# Patient Record
Sex: Male | Born: 1965
Health system: Southern US, Community
[De-identification: ages and names within clinical notes are randomized; demographics above are authoritative.]

## PROBLEM LIST (undated history)

## (undated) DIAGNOSIS — M2550 Pain in unspecified joint: Secondary | ICD-10-CM

## (undated) DIAGNOSIS — K219 Gastro-esophageal reflux disease without esophagitis: Secondary | ICD-10-CM

## (undated) DIAGNOSIS — Z8619 Personal history of other infectious and parasitic diseases: Secondary | ICD-10-CM

## (undated) DIAGNOSIS — G4700 Insomnia, unspecified: Secondary | ICD-10-CM

## (undated) DIAGNOSIS — F329 Major depressive disorder, single episode, unspecified: Secondary | ICD-10-CM

## (undated) DIAGNOSIS — E785 Hyperlipidemia, unspecified: Secondary | ICD-10-CM

## (undated) DIAGNOSIS — F32A Depression, unspecified: Secondary | ICD-10-CM

## (undated) DIAGNOSIS — I10 Essential (primary) hypertension: Secondary | ICD-10-CM

## (undated) HISTORY — DX: Insomnia, unspecified: G47.00

## (undated) HISTORY — DX: Major depressive disorder, single episode, unspecified: F32.9

## (undated) HISTORY — DX: Personal history of other infectious and parasitic diseases: Z86.19

## (undated) HISTORY — DX: Depression, unspecified: F32.A

## (undated) HISTORY — DX: Hyperlipidemia, unspecified: E78.5

## (undated) HISTORY — DX: Pain in unspecified joint: M25.50

## (undated) HISTORY — PX: HEMORRHOID SURGERY: SHX153

## (undated) HISTORY — PX: COLONOSCOPY: SHX174

## (undated) HISTORY — DX: Gastro-esophageal reflux disease without esophagitis: K21.9

## (undated) HISTORY — DX: Essential (primary) hypertension: I10

---

## 2003-10-04 ENCOUNTER — Encounter: Payer: Self-pay | Admitting: Family Medicine

## 2003-10-04 ENCOUNTER — Encounter: Admission: RE | Admit: 2003-10-04 | Discharge: 2003-10-04 | Payer: Self-pay | Admitting: Family Medicine

## 2003-12-23 ENCOUNTER — Encounter (INDEPENDENT_AMBULATORY_CARE_PROVIDER_SITE_OTHER): Payer: Self-pay | Admitting: Specialist

## 2003-12-23 ENCOUNTER — Ambulatory Visit (HOSPITAL_COMMUNITY): Admission: RE | Admit: 2003-12-23 | Discharge: 2003-12-23 | Payer: Self-pay | Admitting: General Surgery

## 2003-12-23 ENCOUNTER — Ambulatory Visit (HOSPITAL_BASED_OUTPATIENT_CLINIC_OR_DEPARTMENT_OTHER): Admission: RE | Admit: 2003-12-23 | Discharge: 2003-12-23 | Payer: Self-pay | Admitting: General Surgery

## 2004-12-17 HISTORY — PX: NASAL FRACTURE SURGERY: SHX718

## 2004-12-17 HISTORY — PX: HAND SURGERY: SHX662

## 2005-01-03 ENCOUNTER — Ambulatory Visit (HOSPITAL_COMMUNITY): Admission: RE | Admit: 2005-01-03 | Discharge: 2005-01-03 | Payer: Self-pay | Admitting: Orthopedic Surgery

## 2005-01-06 ENCOUNTER — Encounter (INDEPENDENT_AMBULATORY_CARE_PROVIDER_SITE_OTHER): Payer: Self-pay | Admitting: Specialist

## 2005-01-06 ENCOUNTER — Ambulatory Visit (HOSPITAL_COMMUNITY): Admission: RE | Admit: 2005-01-06 | Discharge: 2005-01-07 | Payer: Self-pay | Admitting: Orthopedic Surgery

## 2005-01-24 ENCOUNTER — Ambulatory Visit (HOSPITAL_COMMUNITY): Admission: RE | Admit: 2005-01-24 | Discharge: 2005-01-24 | Payer: Self-pay | Admitting: *Deleted

## 2005-07-26 ENCOUNTER — Ambulatory Visit (HOSPITAL_BASED_OUTPATIENT_CLINIC_OR_DEPARTMENT_OTHER): Admission: RE | Admit: 2005-07-26 | Discharge: 2005-07-26 | Payer: Self-pay | Admitting: Orthopedic Surgery

## 2005-07-26 ENCOUNTER — Ambulatory Visit (HOSPITAL_COMMUNITY): Admission: RE | Admit: 2005-07-26 | Discharge: 2005-07-26 | Payer: Self-pay | Admitting: Orthopedic Surgery

## 2006-01-09 ENCOUNTER — Encounter: Admission: RE | Admit: 2006-01-09 | Discharge: 2006-01-09 | Payer: Self-pay | Admitting: Family Medicine

## 2006-06-18 ENCOUNTER — Ambulatory Visit: Payer: Self-pay | Admitting: Family Medicine

## 2006-09-18 ENCOUNTER — Ambulatory Visit: Payer: Self-pay | Admitting: Family Medicine

## 2006-12-19 ENCOUNTER — Ambulatory Visit: Payer: Self-pay | Admitting: Internal Medicine

## 2007-01-14 ENCOUNTER — Ambulatory Visit: Payer: Self-pay | Admitting: Internal Medicine

## 2007-02-09 DIAGNOSIS — G4700 Insomnia, unspecified: Secondary | ICD-10-CM | POA: Insufficient documentation

## 2007-02-09 DIAGNOSIS — I10 Essential (primary) hypertension: Secondary | ICD-10-CM | POA: Insufficient documentation

## 2007-03-19 ENCOUNTER — Ambulatory Visit: Payer: Self-pay | Admitting: Family Medicine

## 2007-03-19 LAB — CONVERTED CEMR LAB
Chloride: 107 meq/L (ref 96–112)
Cholesterol: 212 mg/dL (ref 0–200)
Direct LDL: 119.8 mg/dL
GFR calc Af Amer: 105 mL/min
GFR calc non Af Amer: 87 mL/min
Potassium: 3.8 meq/L (ref 3.5–5.1)
Sodium: 141 meq/L (ref 135–145)
Total CHOL/HDL Ratio: 5.3
Triglycerides: 386 mg/dL (ref 0–149)
VLDL: 77 mg/dL — ABNORMAL HIGH (ref 0–40)

## 2007-08-26 ENCOUNTER — Ambulatory Visit: Payer: Self-pay | Admitting: Family Medicine

## 2007-08-26 DIAGNOSIS — J45909 Unspecified asthma, uncomplicated: Secondary | ICD-10-CM | POA: Insufficient documentation

## 2007-08-28 ENCOUNTER — Telehealth (INDEPENDENT_AMBULATORY_CARE_PROVIDER_SITE_OTHER): Payer: Self-pay | Admitting: *Deleted

## 2007-08-28 LAB — CONVERTED CEMR LAB
CO2: 27 meq/L (ref 19–32)
Calcium: 9.6 mg/dL (ref 8.4–10.5)
Chloride: 106 meq/L (ref 96–112)
Creatinine, Ser: 1.1 mg/dL (ref 0.4–1.5)
Direct LDL: 141 mg/dL
Glucose, Bld: 103 mg/dL — ABNORMAL HIGH (ref 70–99)
Triglycerides: 295 mg/dL (ref 0–149)

## 2007-10-28 ENCOUNTER — Ambulatory Visit: Payer: Self-pay | Admitting: Family Medicine

## 2007-11-05 ENCOUNTER — Telehealth (INDEPENDENT_AMBULATORY_CARE_PROVIDER_SITE_OTHER): Payer: Self-pay | Admitting: *Deleted

## 2007-11-05 ENCOUNTER — Encounter (INDEPENDENT_AMBULATORY_CARE_PROVIDER_SITE_OTHER): Payer: Self-pay | Admitting: *Deleted

## 2007-11-05 LAB — CONVERTED CEMR LAB
Cholesterol: 160 mg/dL (ref 0–200)
Total CHOL/HDL Ratio: 4.3
Triglycerides: 223 mg/dL (ref 0–149)

## 2007-12-18 DIAGNOSIS — M2550 Pain in unspecified joint: Secondary | ICD-10-CM

## 2007-12-18 HISTORY — DX: Pain in unspecified joint: M25.50

## 2007-12-30 ENCOUNTER — Ambulatory Visit: Payer: Self-pay | Admitting: Family Medicine

## 2007-12-30 DIAGNOSIS — R1013 Epigastric pain: Secondary | ICD-10-CM

## 2007-12-30 DIAGNOSIS — F3289 Other specified depressive episodes: Secondary | ICD-10-CM | POA: Insufficient documentation

## 2007-12-30 DIAGNOSIS — K3189 Other diseases of stomach and duodenum: Secondary | ICD-10-CM | POA: Insufficient documentation

## 2007-12-30 DIAGNOSIS — F329 Major depressive disorder, single episode, unspecified: Secondary | ICD-10-CM | POA: Insufficient documentation

## 2008-01-01 ENCOUNTER — Encounter (INDEPENDENT_AMBULATORY_CARE_PROVIDER_SITE_OTHER): Payer: Self-pay | Admitting: *Deleted

## 2008-01-01 LAB — CONVERTED CEMR LAB
BUN: 14 mg/dL (ref 6–23)
Calcium: 9.3 mg/dL (ref 8.4–10.5)
Direct LDL: 88.8 mg/dL
GFR calc Af Amer: 85 mL/min
GFR calc non Af Amer: 70 mL/min
Glucose, Bld: 94 mg/dL (ref 70–99)
HDL: 40.1 mg/dL (ref >39.0)
Potassium: 3.6 meq/L (ref 3.5–5.1)
Total CHOL/HDL Ratio: 4.3
Triglycerides: 288 mg/dL (ref 0–149)

## 2008-04-28 ENCOUNTER — Ambulatory Visit: Payer: Self-pay | Admitting: Internal Medicine

## 2008-04-28 DIAGNOSIS — E785 Hyperlipidemia, unspecified: Secondary | ICD-10-CM | POA: Insufficient documentation

## 2008-05-04 LAB — CONVERTED CEMR LAB
ALT: 40 units/L (ref 0–53)
Cholesterol: 161 mg/dL (ref 0–200)
Direct LDL: 69.2 mg/dL
Total CHOL/HDL Ratio: 5.5

## 2008-06-01 ENCOUNTER — Telehealth: Payer: Self-pay | Admitting: Internal Medicine

## 2008-07-27 ENCOUNTER — Encounter: Admission: RE | Admit: 2008-07-27 | Discharge: 2008-07-27 | Payer: Self-pay | Admitting: Internal Medicine

## 2008-07-27 ENCOUNTER — Ambulatory Visit: Payer: Self-pay | Admitting: Internal Medicine

## 2008-07-27 DIAGNOSIS — M2550 Pain in unspecified joint: Secondary | ICD-10-CM | POA: Insufficient documentation

## 2008-07-27 DIAGNOSIS — R51 Headache: Secondary | ICD-10-CM | POA: Insufficient documentation

## 2008-07-27 DIAGNOSIS — R519 Headache, unspecified: Secondary | ICD-10-CM | POA: Insufficient documentation

## 2008-07-27 LAB — CONVERTED CEMR LAB: Rapid Strep: NEGATIVE

## 2008-07-28 ENCOUNTER — Ambulatory Visit: Payer: Self-pay | Admitting: Internal Medicine

## 2008-07-30 ENCOUNTER — Encounter (INDEPENDENT_AMBULATORY_CARE_PROVIDER_SITE_OTHER): Payer: Self-pay | Admitting: *Deleted

## 2008-07-30 ENCOUNTER — Encounter: Payer: Self-pay | Admitting: Internal Medicine

## 2008-07-30 ENCOUNTER — Telehealth (INDEPENDENT_AMBULATORY_CARE_PROVIDER_SITE_OTHER): Payer: Self-pay | Admitting: *Deleted

## 2008-07-30 LAB — CONVERTED CEMR LAB
Basophils Absolute: 0.1 10*3/uL (ref 0.0–0.1)
Basophils Relative: 3.5 % — ABNORMAL HIGH (ref 0.0–3.0)
CO2: 27 meq/L (ref 19–32)
Chloride: 109 meq/L (ref 96–112)
Creatinine, Ser: 1.2 mg/dL (ref 0.4–1.5)
Eosinophils Absolute: 0.2 10*3/uL (ref 0.0–0.7)
GFR calc non Af Amer: 70 mL/min
Lymphocytes Relative: 63.9 % — ABNORMAL HIGH (ref 12.0–46.0)
MCHC: 35.4 g/dL (ref 30.0–36.0)
MCV: 87.6 fL (ref 78.0–100.0)
Mono Screen: NEGATIVE
Neutrophils Relative %: 17.1 % — ABNORMAL LOW (ref 43.0–77.0)
Platelets: 139 10*3/uL — ABNORMAL LOW (ref 150–400)
Potassium: 4.2 meq/L (ref 3.5–5.1)
RBC: 5.16 M/uL (ref 4.22–5.81)
Rhuematoid fact SerPl-aCnc: <20 intl units/mL — ABNORMAL LOW (ref 0.0–20.0)
Sodium: 143 meq/L (ref 135–145)
Total CK: 240 units/L (ref 7–195)

## 2008-08-03 ENCOUNTER — Encounter: Payer: Self-pay | Admitting: Internal Medicine

## 2008-08-17 ENCOUNTER — Encounter: Payer: Self-pay | Admitting: Internal Medicine

## 2008-09-08 ENCOUNTER — Encounter: Payer: Self-pay | Admitting: Internal Medicine

## 2008-10-20 ENCOUNTER — Telehealth (INDEPENDENT_AMBULATORY_CARE_PROVIDER_SITE_OTHER): Payer: Self-pay | Admitting: *Deleted

## 2008-10-27 ENCOUNTER — Ambulatory Visit: Payer: Self-pay | Admitting: Internal Medicine

## 2009-01-28 ENCOUNTER — Ambulatory Visit: Payer: Self-pay | Admitting: Internal Medicine

## 2009-04-04 ENCOUNTER — Ambulatory Visit: Payer: Self-pay | Admitting: Family Medicine

## 2009-04-05 ENCOUNTER — Telehealth (INDEPENDENT_AMBULATORY_CARE_PROVIDER_SITE_OTHER): Payer: Self-pay | Admitting: *Deleted

## 2009-04-06 ENCOUNTER — Ambulatory Visit: Payer: Self-pay | Admitting: Internal Medicine

## 2009-04-07 ENCOUNTER — Telehealth (INDEPENDENT_AMBULATORY_CARE_PROVIDER_SITE_OTHER): Payer: Self-pay | Admitting: *Deleted

## 2009-09-05 ENCOUNTER — Ambulatory Visit: Payer: Self-pay | Admitting: Internal Medicine

## 2009-10-03 ENCOUNTER — Ambulatory Visit: Payer: Self-pay | Admitting: Family Medicine

## 2009-10-04 ENCOUNTER — Ambulatory Visit: Payer: Self-pay | Admitting: Family Medicine

## 2009-10-05 ENCOUNTER — Telehealth (INDEPENDENT_AMBULATORY_CARE_PROVIDER_SITE_OTHER): Payer: Self-pay | Admitting: *Deleted

## 2009-12-02 ENCOUNTER — Encounter (INDEPENDENT_AMBULATORY_CARE_PROVIDER_SITE_OTHER): Payer: Self-pay | Admitting: *Deleted

## 2009-12-17 HISTORY — PX: KNEE SURGERY: SHX244

## 2010-01-02 ENCOUNTER — Ambulatory Visit: Payer: Self-pay | Admitting: Internal Medicine

## 2010-01-06 ENCOUNTER — Encounter (INDEPENDENT_AMBULATORY_CARE_PROVIDER_SITE_OTHER): Payer: Self-pay | Admitting: *Deleted

## 2010-01-06 LAB — CONVERTED CEMR LAB
BUN: 10 mg/dL (ref 6–23)
Chloride: 106 meq/L (ref 96–112)
Cholesterol: 177 mg/dL (ref 0–200)
Creatinine, Ser: 1.1 mg/dL (ref 0.4–1.5)
Glucose, Bld: 124 mg/dL — ABNORMAL HIGH (ref 70–99)
HDL: 40.5 mg/dL (ref >39.00)
Potassium: 4.3 meq/L (ref 3.5–5.1)

## 2010-04-19 ENCOUNTER — Ambulatory Visit: Payer: Self-pay | Admitting: Internal Medicine

## 2010-04-24 ENCOUNTER — Ambulatory Visit: Payer: Self-pay | Admitting: Internal Medicine

## 2010-04-26 LAB — CONVERTED CEMR LAB
ALT: 40 units/L (ref 0–53)
Basophils Absolute: 0 10*3/uL (ref 0.0–0.1)
Cholesterol: 155 mg/dL (ref 0–200)
Eosinophils Absolute: 0.4 10*3/uL (ref 0.0–0.7)
HCT: 44.2 % (ref 39.0–52.0)
Hgb A1c MFr Bld: 5.2 % (ref 4.6–6.5)
Lymphs Abs: 2.9 10*3/uL (ref 0.7–4.0)
MCHC: 35.8 g/dL (ref 30.0–36.0)
MCV: 86.8 fL (ref 78.0–100.0)
Monocytes Absolute: 0.8 10*3/uL (ref 0.1–1.0)
Platelets: 179 10*3/uL (ref 150.0–400.0)
RDW: 13.3 % (ref 11.5–14.6)
Total CHOL/HDL Ratio: 4
Triglycerides: 522 mg/dL — ABNORMAL HIGH (ref 0.0–149.0)
VLDL: 104.4 mg/dL — ABNORMAL HIGH (ref 0.0–40.0)

## 2010-06-26 ENCOUNTER — Encounter: Payer: Self-pay | Admitting: Internal Medicine

## 2010-10-23 ENCOUNTER — Ambulatory Visit: Payer: Self-pay | Admitting: Internal Medicine

## 2010-10-27 ENCOUNTER — Ambulatory Visit: Payer: Self-pay | Admitting: Internal Medicine

## 2010-10-30 LAB — CONVERTED CEMR LAB
ALT: 57 units/L — ABNORMAL HIGH (ref 0–53)
AST: 30 units/L (ref 0–37)
BUN: 11 mg/dL (ref 6–23)
GFR calc non Af Amer: 121.25 mL/min (ref >60)
HDL: 48.7 mg/dL (ref >39.00)
Potassium: 4 meq/L (ref 3.5–5.1)
Sodium: 139 meq/L (ref 135–145)
Total CHOL/HDL Ratio: 4

## 2011-01-14 LAB — CONVERTED CEMR LAB
ALT: 33 units/L (ref 0–53)
Bilirubin, Direct: 0.2 mg/dL (ref 0.0–0.3)
Cholesterol: 201 mg/dL (ref 0–200)
Direct LDL: 139.4 mg/dL
Eosinophils Absolute: 0.2 10*3/uL (ref 0.0–0.7)
HCT: 46.9 % (ref 39.0–52.0)
Hemoglobin: 16.4 g/dL (ref 13.0–17.0)
MCV: 89.5 fL (ref 78.0–100.0)
Monocytes Absolute: 0.8 10*3/uL (ref 0.1–1.0)
Monocytes Relative: 6.7 % (ref 3.0–12.0)
Neutro Abs: 7.1 10*3/uL (ref 1.4–7.7)
Platelets: 180 10*3/uL (ref 150–400)
RDW: 13.1 % (ref 11.5–14.6)
TSH: 1.11 microintl units/mL (ref 0.35–5.50)
Total Bilirubin: 1.3 mg/dL — ABNORMAL HIGH (ref 0.3–1.2)
Total CHOL/HDL Ratio: 4.4
Total Protein: 7.2 g/dL (ref 6.0–8.3)
Triglycerides: 100 mg/dL (ref 0–149)

## 2011-01-16 NOTE — Letter (Signed)
Summary: planning knee surgery---- [facility]   Imported By: Lanelle Bal 07/11/2010 07:56:33  _____________________________________________________________________  External Attachment:    Type:   Image     Comment:   External Document

## 2011-01-16 NOTE — Assessment & Plan Note (Signed)
Summary: CPX/KDC   Vital Signs:  Patient profile:   45 year old male Height:      71 inches Weight:      218 pounds BMI:     30.51 Pulse rate:   68 / minute BP sitting:   120 / 72  Vitals Entered By: Shary Decamp (Apr 19, 2010 1:19 PM) CC: cpx, pt has not had anything to eat but has had a couple bottles of gatorade Comments  - c/o of bilat knee pain Shary Decamp  Apr 19, 2010 1:20 PM    History of Present Illness: CPX nonfasting knee pain-- x a while, worse x 2 weeks, R>L  walks quite a bit at work      Preventive Screening-Counseling & Management  Alcohol-Tobacco     Alcohol type: weekends  Caffeine-Diet-Exercise     Caffeine use/day: 1     Times/week: 5  Allergies: No Known Drug Allergies  Past History:  Past Medical History: HYPERTENSION Hyperlipidemia ASTHMA   DEPRESSION INSOMNIA  polyarthralgia (2009): blood work (-), CKs slightly  elevated, bone scan (-), saw rheumatology ; continue w/ symptoms after holding zocor   Past Surgical History: Reviewed history from 08/26/2007 and no changes required. hand surgery nose surgery Hemorrhoidectomy  Family History: Reviewed history from 01/28/2009 and no changes required. DM-- M F HTN-- M MI--no prostate ca--no Colon ca--no  Social History: Reviewed history from 01/28/2009 and no changes required. 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 diet-- not good  very active at work plus exercises  Caffeine use/day:  1  Review of Systems General:  Denies fatigue, fever, and weight loss. CV:  Denies chest pain or discomfort and swelling of feet; good ambulatory BPs , 120/70s . Resp:  Denies cough and shortness of breath. GI:  Denies bloody stools, nausea, and vomiting. GU:  Denies dysuria and hematuria. Psych:  symptoms well controlled w/ lexapro, sleeps well as long as he takes [place].  Physical  Exam  General:  alert, well-developed, and well-nourished.   Neck:  no masses, no thyromegaly, and normal carotid upstroke.   Lungs:  normal respiratory effort, no intercostal retractions, no accessory muscle use, and normal breath sounds.   Heart:  normal rate, regular rhythm, and no murmur.   Abdomen:  soft, non-tender, no distention, and no masses.   Rectal:  external hemorrhoid noted. Normal sphincter tone. No rectal masses or tenderness. Prostate:  Prostate gland firm and smooth, no enlargement, nodularity, tenderness, mass, asymmetry or induration. Msk:  right knee slightly swollen, lower rate, slightly warm, a very small effusion by physical exam. Ligaments seem stable left knee: Normal Extremities:  no pretibial edema    Impression & Recommendations:  Problem # 1:  PREVENTIVE HEALTH CARE (ICD-V70.0) Td 2006 never had a  Cscope, no family history colon cancer will start doing PSAs every two years until age 11,  then  yearly  ( patient is African-American) sees dentist routinely he is very active but needs to improve diet.  Information provided regards a healthy diet and  a 2000-calorie plan last labs showed  increased TG and   sugar, will check  not only a FLP  but also a hemoglobin A1c  Problem # 2:  HYPERTENSION (ICD-401.9) at goal  His updated medication list for this problem includes:    Lopressor 50 Mg Tabs (Metoprolol tartrate) ..... Bid    Norvasc 5 Mg Tabs (  Amlodipine besylate) .Marland Kitchen... 1 by mouth qd  BP today: 120/72 Prior BP: 124/80 (01/02/2010)  Labs Reviewed: K+: 4.3 (01/02/2010) Creat: : 1.1 (01/02/2010)   Chol: 177 (01/02/2010)   HDL: 40.50 (01/02/2010)   LDL: DEL (01/28/2009)   TG: 235.0 (01/02/2010)  Problem # 3:  POLYARTHRALGIA (ICD-719.49) presents today with knee pain, he does have a right knee small effusion.  No fever. refer  to ortho ----> declined  we agreed then to take aleve, use a knee sleeve and referal if no better   Complete Medication  List: 1)  Lopressor 50 Mg Tabs (Metoprolol tartrate) .... Bid 2)  Lexapro 10 Mg Tabs (Escitalopram oxalate) .... Take 1 tablet by mouth every morning 3)  Norvasc 5 Mg Tabs (Amlodipine besylate) .Marland Kitchen.. 1 by mouth qd 4)  Ambien Cr 12.5 Mg Tbcr (Zolpidem tartrate) .Marland Kitchen.. 1 by mouth at bedtime 5)  Zocor 20 Mg Tabs (Simvastatin) .... Take one tablet daily 6)  Ventolin Hfa 108 (90 Base) Mcg/act Aers (Albuterol sulfate) .... 2 puffs every 6 h as needed  Patient Instructions: 1)  come back fasting: 2)  FLP, AST, ALT, TSH, CBC, PSA ----dx v70 3)  hemoglobin A1c-----dx hyperglycemia 4)  Please schedule a follow-up appointment in 6 months .  Prescriptions: VENTOLIN HFA 108 (90 BASE) MCG/ACT AERS (ALBUTEROL SULFATE) 2 puffs every 6 h as needed  #3 x 3   Entered by:   Shary Decamp   Authorized by:   Nolon Rod. Gerldine Suleiman MD   Signed by:   Shary Decamp on 04/19/2010   Method used:   Printed then faxed to ...       [facility]  [address] [id number]* (retail)       [address]       [place], [place]  [address]       Ph: [phone] or [phone]       Fax: [phone]   RxID:   [id number] ZOCOR 20 MG  TABS (SIMVASTATIN) Take one tablet daily  #90 x 3   Entered by:   Shary Decamp   Authorized by:   Nolon Rod. Carely Nappier MD   Signed by:   Shary Decamp on 04/19/2010   Method used:   Printed then faxed to ...       [facility]  [address] [id number]* (retail)       [address]       [place], [place]  [address]       Ph: [phone] or [phone]       Fax: [phone]   RxID:   [id number] AMBIEN CR 12.5 MG TBCR (ZOLPIDEM TARTRATE) 1 by mouth at bedtime  #90 x 0   Entered by:   Shary Decamp   Authorized by:   Nolon Rod. Morrissa Shein MD   Signed by:   Shary Decamp on 04/19/2010   Method used:   Printed then faxed to ...       [facility]  [address] [id number]* (retail)       [address]       [place], [place]  [address]       Ph: [phone] or [phone]        Fax: [phone]   RxID:   [id number] NORVASC 5 MG  TABS (AMLODIPINE BESYLATE) 1 by mouth QD  #90 x 3   Entered by:  Shary Decamp   Authorized by:   Nolon Rod. Isadore Palecek MD   Signed by:   Shary Decamp on 04/19/2010   Method used:   Printed then faxed to ...       [facility]  [address] [id number]* (retail)       [address]       [place], [place]  [address]       Ph: [phone] or [phone]       Fax: [phone]   RxID:   [id number] LEXAPRO 10 MG TABS (ESCITALOPRAM OXALATE) Take 1 tablet by mouth every morning  #90 x 3   Entered by:   Shary Decamp   Authorized by:   Nolon Rod. Jaylene Arrowood MD   Signed by:   Shary Decamp on 04/19/2010   Method used:   Printed then faxed to ...       [facility]  [address] [id number] (retail)       [address]       [place], [place]  [address]       Ph: [phone] or [phone]       Fax: [phone]   RxID:   [id number] LOPRESSOR 50 MG TABS (METOPROLOL TARTRATE) BID  #180 x 3   Entered by:   Shary Decamp   Authorized by:   Nolon Rod. Briseis Aguilera MD   Signed by:   Shary Decamp on 04/19/2010   Method used:   Printed then faxed to ...       [facility]  [address] [id number] (retail)       [address]       [place], [place]  [address]       Ph: [phone] or [phone]       Fax: [phone]   RxID:   [id number]    Preventive Care Screening  Prior Values:    PSA:  0.39 (12/30/2007)    Last Tetanus Booster:  given (12/17/2004)    Risk Factors:  Tobacco use:  never Drug use:  no Caffeine use:  1 drinks per day Alcohol use:  yes    Type:  weekends Exercise:  yes    Times per week:  5

## 2011-01-16 NOTE — Assessment & Plan Note (Signed)
Summary: 6 month roa/lch   Vital Signs:  Patient profile:   45 year old male Weight:      225 pounds Pulse rate:   73 / minute Pulse rhythm:   regular BP sitting:   128 / 90  (left arm) Cuff size:   large  Vitals Entered By: Army Fossa CMA (October 23, 2010 3:37 PM) CC: 6 month f/u- not fasting Comments Taking Methacarbamol- unsure strenght dr Cornelius Moras rx'ing. [facility] [address]   History of Present Illness: six-month followup    Base on the last cholesterol,  we rec. a  fenofibrate. He took it for a short period of time but  did not like it. Self discontinued it, does not recall exactly what is what he didn't like  ROS He had surgery for a meniscal tear few weeks ago, pain has not improved, he had a local injection and that's so far not helping His diet has not changed, still eating poorly Despite knee pain he remains active Complaining of feeling frustrated a lot, wonders if adjust his Lexapro makes sense. He denies withdrawal from friends or sadness per se  Allergies: No Known Drug Allergies  Past History:  Past Medical History: HYPERTENSION Hyperlipidemia ASTHMA   DEPRESSION INSOMNIA  polyarthralgia (2009): blood work (-), CKs slightly  elevated, bone scan (-), saw rheumatology ; continue w/ symptoms after holding zocor   Past Surgical History: hand surgery nose surgery Hemorrhoidectomy R knee surgery 2011 (meniscal tear)  Social History: Reviewed history from 04/19/2010 and no changes required. 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 diet-- not good  very active at work plus exercises   Physical Exam  General:  alert and well-developed.   Lungs:  normal respiratory effort, no intercostal retractions, no accessory muscle use, and normal breath sounds.   Heart:  normal rate, regular rhythm, and no murmur.   Extremities:  no edema Psych:   Oriented X3, good eye contact, not anxious appearing, and not depressed appearing.     Impression & Recommendations:  Problem # 1:  HYPERLIPIDEMIA (ICD-272.4) we added fenofibrate based on the last cholesterol panel, he did not like it Plan: Labs Reassess treatment, increase Zocor?, Niaspan? The following medications were removed from the medication list:    Fenofibrate 160 Mg Tabs (Fenofibrate) .Marland Kitchen... 1 by mouth once daily - due labs in 6 weeks (end of june "11) His updated medication list for this problem includes:    Zocor 20 Mg Tabs (Simvastatin) .Marland Kitchen... Take one tablet daily  Labs Reviewed: SGOT: 25 (04/24/2010)   SGPT: 40 (04/24/2010)   HDL:40.90 (04/24/2010), 40.50 (01/02/2010)  LDL:DEL (01/28/2009), DEL (04/28/2008)  Chol:155 (04/24/2010), 177 (01/02/2010)  Trig:522.0 (04/24/2010), 235.0 (01/02/2010)  Problem # 2:  HYPERTENSION (ICD-401.9) no change for now His updated medication list for this problem includes:    Lopressor 50 Mg Tabs (Metoprolol tartrate) ..... Bid    Norvasc 5 Mg Tabs (Amlodipine besylate) .Marland Kitchen... 1 by mouth qd  BP today: 128/90 Prior BP: 120/72 (04/19/2010)  Labs Reviewed: K+: 4.3 (01/02/2010) Creat: : 1.1 (01/02/2010)   Chol: 155 (04/24/2010)   HDL: 40.90 (04/24/2010)   LDL: DEL (01/28/2009)   TG: 522.0 (04/24/2010)  Problem # 3:  DEPRESSION (ICD-311) feeling quite frustrated  lately, I wonder if that is related to chronic pain from the knee. We agreed to increase Lexapro from 10 to 20 mg ADDENDUM has used tramadol sporadically for pain----  no help.Additionally , tramadol interact w/  lexapro, we changed to vicodin for pain control His updated medication list for this problem includes:    Lexapro 20 Mg Tabs (Escitalopram oxalate) ..... One by mouth daily  Problem # 4:  POLYARTHRALGIA (ICD-719.49) has used tramadol sporadically for pain--- no help. Additionally , tramadol interact w/  lexapro, we changed to vicodin for pain control  Complete  Medication List: 1)  Lopressor 50 Mg Tabs (Metoprolol tartrate) .... Bid 2)  Norvasc 5 Mg Tabs (Amlodipine besylate) .Marland Kitchen.. 1 by mouth qd 3)  Lexapro 20 Mg Tabs (Escitalopram oxalate) .... One by mouth daily 4)  Ambien Cr 12.5 Mg Tbcr (Zolpidem tartrate) .Marland Kitchen.. 1 by mouth at bedtime 5)  Zocor 20 Mg Tabs (Simvastatin) .... Take one tablet daily 6)  Ventolin Hfa 108 (90 Base) Mcg/act Aers (Albuterol sulfate) .... 2 puffs every 6 h as needed 7)  Vicodin 5-500 Mg Tabs (Hydrocodone-acetaminophen) .Marland Kitchen.. 1 every 4 hours as needed for pain  Patient Instructions: 1)  please come back fasting 2)  FLP, AST, ALT----dx high cholesterol 3)  BMP---- dx hypertension 4)  Increase Lexapro from 10 to 20mg  daily 5)  Please schedule a follow-up appointment in 3 months .  Prescriptions: VICODIN 5-500 MG TABS (HYDROCODONE-ACETAMINOPHEN) 1 every 4 hours as needed for pain  #30 x 0   Entered and Authorized by:   Nolon Rod. Paz MD   Signed by:   Nolon Rod. Paz MD on 10/23/2010   Method used:   Print then Give to Patient   RxID:   [id number] LEXAPRO 20 MG TABS (ESCITALOPRAM OXALATE) one by mouth daily  #30 x 3   Entered and Authorized by:   Nolon Rod. Paz MD   Signed by:   Nolon Rod. Paz MD on 10/23/2010   Method used:   Print then Give to Patient   RxID:   [id number]    Orders Added: 1)  Est. Patient Level III [[address]]   Immunization History:  Influenza Immunization History:    Influenza:  historical (10/17/2010)   Immunization History:  Influenza Immunization History:    Influenza:  Historical (10/17/2010)

## 2011-01-16 NOTE — Letter (Signed)
Summary: Results Follow up Letter  Julian at [facility]  [address] [place], [place] [address]   Phone: [phone]  Fax: [phone]    01/06/2010 MRN: [id number]  Daniel [name] [address] [place], [place]  [address]  Dear Mr. Bursch,  The following are the results of your recent test(s):   Attached is a copy of your lab work from your recent office visit.  Your triglycerides & sugar are slightly high.  Please watch your diet & exercise.  We will recheck your labs at your next office visit.  Please call me if you have any questions.   Shary Decamp, [place] [phone] ext [phone]

## 2011-01-16 NOTE — Assessment & Plan Note (Signed)
Summary: rov/kdc   Vital Signs:  Patient profile:   45 year old male Height:      71 inches Weight:      225.6 pounds BMI:     31.58 Pulse rate:   76 / minute Pulse rhythm:   regular BP sitting:   124 / 80  (left arm) Cuff size:   large  Vitals Entered By: Shary Decamp (January 02, 2010 11:43 AM) CC: rov   History of Present Illness: ROV  Current Medications (verified): 1)  Lopressor 50 Mg Tabs (Metoprolol Tartrate) .... Bid 2)  Lexapro 10 Mg Tabs (Escitalopram Oxalate) .... Take 1 Tablet By Mouth Every Morning 3)  Norvasc 5 Mg  Tabs (Amlodipine Besylate) .Marland Kitchen.. 1 By Mouth Qd 4)  Ambien Cr 12.5 Mg Tbcr (Zolpidem Tartrate) .Marland Kitchen.. 1 By Mouth At Bedtime 5)  Zocor 20 Mg  Tabs (Simvastatin) .... Take One Tablet Daily  Allergies (verified): No Known Drug Allergies  Past History:  Past Medical History: HYPERTENSION Hyperlipidemia ASTHMA   DEPRESSION INSOMNIA  polyarthralgia (2009): blood work (-), CKs slightly  elevated, bone scan (-), saw rheumatology ; continue w/ symptoms after holding zocor   Past Surgical History: Reviewed history from 08/26/2007 and no changes required. hand surgery nose surgery Hemorrhoidectomy  Social History: Reviewed history from 01/28/2009 and no changes required. Occupation:Machine operator Married 1 adopted child  Drug use-no Regular exercise-yes  Review of Systems       HYPERTENSION-- ambulatory BPs 120/80s, good medication compliance  Hyperlipidemia-- good medication compliance  ASTHMA  -- occasionally has wheezing , uses the inhaler twice a week  INSOMNIA -- symptoms well controlled w/  ambien    Physical Exam  General:  alert, well-developed, and well-nourished.   Lungs:  normal respiratory effort, no intercostal retractions, no accessory muscle use, and normal breath sounds.   Heart:  normal rate, regular rhythm, and no murmur.   Extremities:  no pretibial edema bilaterally    Impression & Recommendations:  Problem # 1:   HYPERLIPIDEMIA (ICD-272.4) due for labs  His updated medication list for this problem includes:    Zocor 20 Mg Tabs (Simvastatin) .Marland Kitchen... Take one tablet daily  Orders: Venipuncture ([address]11) TLB-Lipid Panel (80061-LIPID) TLB-ALT (SGPT) (84460-ALT) TLB-AST (SGOT) (84450-SGOT)  Labs Reviewed: SGOT: 20 (01/28/2009)   SGPT: 33 (01/28/2009)   HDL:45.4 (01/28/2009), 29.1 (04/28/2008)  LDL:DEL (01/28/2009), DEL (04/28/2008)  Chol:201 (01/28/2009), 161 (04/28/2008)  Trig:100 (01/28/2009), 573 (04/28/2008)  Problem # 2:  HYPERTENSION (ICD-401.9) at goal  His updated medication list for this problem includes:    Lopressor 50 Mg Tabs (Metoprolol tartrate) ..... Bid    Norvasc 5 Mg Tabs (Amlodipine besylate) .Marland Kitchen... 1 by mouth qd  Orders: TLB-BMP (Basic Metabolic Panel-BMET) (80048-METABOL)  BP today: 124/80 Prior BP: 140/90 (10/03/2009)  Labs Reviewed: K+: 4.2 (07/28/2008) Creat: : 1.2 (07/28/2008)   Chol: 201 (01/28/2009)   HDL: 45.4 (01/28/2009)   LDL: DEL (01/28/2009)   TG: 100 (01/28/2009)  Problem # 3:  ASTHMA (ICD-493.90) well controlled  The following medications were removed from the medication list:    Prednisone 20 Mg Tabs (Prednisone) .Marland Kitchen... 1 by mouth once daily His updated medication list for this problem includes:    Ventolin Hfa 108 (90 Base) Mcg/act Aers (Albuterol sulfate) .Marland Kitchen... 2 puffs every 6 h as needed  Complete Medication List: 1)  Lopressor 50 Mg Tabs (Metoprolol tartrate) .... Bid 2)  Lexapro 10 Mg Tabs (Escitalopram oxalate) .... Take 1 tablet by mouth every morning 3)  Norvasc 5  Mg Tabs (Amlodipine besylate) .Marland Kitchen.. 1 by mouth qd 4)  Ambien Cr 12.5 Mg Tbcr (Zolpidem tartrate) .Marland Kitchen.. 1 by mouth at bedtime 5)  Zocor 20 Mg Tabs (Simvastatin) .... Take one tablet daily 6)  Ventolin Hfa 108 (90 Base) Mcg/act Aers (Albuterol sulfate) .... 2 puffs every 6 h as needed  Patient Instructions: 1)  Please schedule a follow-up appointment in 3 to 4  months (Physical)

## 2011-01-23 ENCOUNTER — Encounter: Payer: Self-pay | Admitting: Internal Medicine

## 2011-01-23 ENCOUNTER — Ambulatory Visit (INDEPENDENT_AMBULATORY_CARE_PROVIDER_SITE_OTHER): Payer: 59 | Admitting: Internal Medicine

## 2011-01-23 DIAGNOSIS — K219 Gastro-esophageal reflux disease without esophagitis: Secondary | ICD-10-CM

## 2011-01-23 DIAGNOSIS — F329 Major depressive disorder, single episode, unspecified: Secondary | ICD-10-CM

## 2011-01-23 DIAGNOSIS — E785 Hyperlipidemia, unspecified: Secondary | ICD-10-CM

## 2011-01-23 DIAGNOSIS — I10 Essential (primary) hypertension: Secondary | ICD-10-CM

## 2011-01-23 DIAGNOSIS — F3289 Other specified depressive episodes: Secondary | ICD-10-CM

## 2011-01-23 DIAGNOSIS — M2550 Pain in unspecified joint: Secondary | ICD-10-CM

## 2011-02-01 NOTE — Assessment & Plan Note (Signed)
Summary: 3 MONTH FOLLOWUP///[facility]  Nurse Visit   Vital Signs:  Patient profile:   45 year old male Height:      71 inches Weight:      225.50 pounds BMI:     31.56 Pulse rate:   86 / minute Pulse rhythm:   regular BP sitting:   136 / 80  (left arm) Cuff size:   large  Vitals Entered By: Army Fossa CMA (January 23, 2011 3:40 PM)  History of Present Illness: here for a followup Doing well Recently got a second injection on the    right knee, pain is better also has developed mild heartburn, responding very well to zegerid 20mg  over-the-counter. Takes 2 tablets daily, nrequests a prescription  ROS denies dysphasia or odynophagia Sleeping well At the time of the last visit he was "frustrated" , some depression. Lexapro was increased. Good compliance and tolerance with the new dose of Lexapro...feels better He has taken Vicodin sporadically with better results with Ultram. His taking his simvastatin without problems.  Remains active but his diet has not been very good lately   Patient Instructions: 1)  Please schedule a follow-up appointment in 3 or 4  months . Fasting, physical exam   CC: 3 month f/u-not fasting  Comments c/o being hot recently and having cotton mouth [facility] [address]    Current Medications (verified): 1)  Lopressor 50 Mg Tabs (Metoprolol Tartrate) .... Bid 2)  Norvasc 5 Mg  Tabs (Amlodipine Besylate) .Marland Kitchen.. 1 By Mouth Qd 3)  Lexapro 20 Mg Tabs (Escitalopram Oxalate) .... One By Mouth Daily 4)  Zocor 20 Mg  Tabs (Simvastatin) .... Take One Tablet Daily 5)  Ventolin Hfa 108 (90 Base) Mcg/act Aers (Albuterol Sulfate) .... 2 Puffs Every 6 H As Needed 6)  Zolpidem Tartrate 12.5 Mg Cr-Tabs (Zolpidem Tartrate) .Marland Kitchen.. 1 By Mouth At Bedtime Prn  Allergies (verified): No Known Drug Allergies  Past History:  Past Medical History: HYPERTENSION Hyperlipidemia ASTHMA   DEPRESSION INSOMNIA  polyarthralgia (2009): blood work (-), CKs slightly  elevated,  bone scan (-), saw rheumatology ; continue w/ symptoms after holding zocor  GERD  Past Surgical History: Reviewed history from 10/23/2010 and no changes required. hand surgery nose surgery Hemorrhoidectomy R knee surgery 2011 (meniscal tear)   Physical Exam  General:  alert and well-developed.   Lungs:  normal respiratory effort, no intercostal retractions, no accessory muscle use, and normal breath sounds.   Heart:  normal rate, regular rhythm, and no murmur.   Extremities:  no edema Psych:  Oriented X3, good eye contact, not anxious appearing, and not depressed appearing.   very good spirits today   Impression & Recommendations:  Problem # 1:  GERD (ICD-530.81) mild heartburn lately, see prescription His updated medication list for this problem includes:    Zegerid 40-1100 Mg Caps (Omeprazole-sodium bicarbonate) .Marland Kitchen... 1 by mouth once daily on a empty stomach  Problem # 2:  HYPERTENSION (ICD-401.9) at goal  His updated medication list for this problem includes:    Lopressor 50 Mg Tabs (Metoprolol tartrate) ..... Bid    Norvasc 5 Mg Tabs (Amlodipine besylate) .Marland Kitchen... 1 by mouth qd  BP today: 136/80 Prior BP: 128/90 (10/23/2010)  Labs Reviewed: K+: 4.0 (10/27/2010) Creat: : 0.9 (10/27/2010)   Chol: 200 (10/27/2010)   HDL: 48.70 (10/27/2010)   LDL: 112 (10/27/2010)   TG: 196.0 (10/27/2010)  Problem # 3:  HYPERLIPIDEMIA (ICD-272.4) last cholesterol panel showed a better triglyceride level. No change, encourage  healthy lifestyle His updated medication list for this problem includes:    Zocor 20 Mg Tabs (Simvastatin) .Marland Kitchen... Take one tablet daily  Labs Reviewed: SGOT: 30 (10/27/2010)   SGPT: 57 (10/27/2010)   HDL:48.70 (10/27/2010), 40.90 (04/24/2010)  LDL:112 (10/27/2010), DEL (01/28/2009)  Chol:200 (10/27/2010), 155 (04/24/2010)  Trig:196.0 (10/27/2010), 522.0 (04/24/2010)  Problem # 4:  DEPRESSION (ICD-311) seems to be doing much better. Continue with Lexapro 20 mg His  updated medication list for this problem includes:    Lexapro 20 Mg Tabs (Escitalopram oxalate) ..... One by mouth daily  Problem # 5:  POLYARTHRALGIA (ICD-719.49) knee pain better after a local injection. Using Vicodin only sporadically. Will call when  a RF is need  Complete Medication List: 1)  Lopressor 50 Mg Tabs (Metoprolol tartrate) .... Bid 2)  Norvasc 5 Mg Tabs (Amlodipine besylate) .Marland Kitchen.. 1 by mouth qd 3)  Lexapro 20 Mg Tabs (Escitalopram oxalate) .... One by mouth daily 4)  Zocor 20 Mg Tabs (Simvastatin) .... Take one tablet daily 5)  Ventolin Hfa 108 (90 Base) Mcg/act Aers (Albuterol sulfate) .... 2 puffs every 6 h as needed 6)  Vicodin 5-500 Mg Tabs (Hydrocodone-acetaminophen) .Marland Kitchen.. 1 every 4 hours as needed for pain 7)  Zolpidem Tartrate 12.5 Mg Cr-tabs (Zolpidem tartrate) .Marland Kitchen.. 1 by mouth at bedtime prn 8)  Zegerid 40-1100 Mg Caps (Omeprazole-sodium bicarbonate) .Marland Kitchen.. 1 by mouth once daily on a empty stomach   Orders Added: 1)  Est. Patient Level III [[address]] Prescriptions: ZEGERID 40-1100 MG CAPS (OMEPRAZOLE-SODIUM BICARBONATE) 1 by mouth once daily on a empty stomach  #90 x 1   Entered and Authorized by:   Nolon Rod. Jersey Espinoza MD   Signed by:   Nolon Rod. Aleshia Cartelli MD on 01/23/2011   Method used:   Electronically to        [id number] (retail)       [address]       [place], [place]  [address]       Ph: [phone] or [phone]       Fax: [phone]   RxID:   [id number]

## 2011-03-05 ENCOUNTER — Encounter: Payer: Self-pay | Admitting: Internal Medicine

## 2011-03-05 ENCOUNTER — Ambulatory Visit (INDEPENDENT_AMBULATORY_CARE_PROVIDER_SITE_OTHER): Payer: 59 | Admitting: Internal Medicine

## 2011-03-05 ENCOUNTER — Other Ambulatory Visit: Payer: Self-pay | Admitting: Internal Medicine

## 2011-03-05 DIAGNOSIS — E119 Type 2 diabetes mellitus without complications: Secondary | ICD-10-CM | POA: Insufficient documentation

## 2011-03-05 LAB — BASIC METABOLIC PANEL
CO2: 23 mEq/L (ref 19–32)
Calcium: 9.3 mg/dL (ref 8.4–10.5)
GFR: 104.3 mL/min (ref >60.00)
Sodium: 135 mEq/L (ref 135–145)

## 2011-03-05 LAB — AST: AST: 17 U/L (ref 0–37)

## 2011-03-06 ENCOUNTER — Encounter: Payer: Self-pay | Admitting: *Deleted

## 2011-03-13 ENCOUNTER — Telehealth: Payer: Self-pay | Admitting: Internal Medicine

## 2011-03-13 NOTE — Telephone Encounter (Signed)
Message copied by Willow Ora on Tue Mar 13, 2011  9:40 PM ------      Message from: Willow Ora      Created: Tue Mar 06, 2011  8:37 AM       Check on him

## 2011-03-13 NOTE — Telephone Encounter (Signed)
Recently dx w/ DM, please check on pt, how are CBGs

## 2011-03-14 ENCOUNTER — Encounter: Payer: Self-pay | Admitting: Internal Medicine

## 2011-03-14 NOTE — Telephone Encounter (Signed)
I spoke w/ pt he states his CBG's have been running in the 80s and 90s

## 2011-03-14 NOTE — Telephone Encounter (Signed)
Great, will see him on f/u

## 2011-03-15 NOTE — Telephone Encounter (Signed)
This encounter was created in error - please disregard.

## 2011-03-15 NOTE — Assessment & Plan Note (Signed)
Summary: went to clinic 2 wks ago--found out he had diabetes--wants to...   Vital Signs:  Patient profile:   45 year old male Weight:      208.13 pounds Pulse rate:   78 / minute Pulse rhythm:   regular BP sitting:   112 / 70  (left arm) Cuff size:   large  Vitals Entered By: Army Fossa CMA (March 05, 2011 9:58 AM) CC: Pt here to discuss recent diagnois of DM. -fasting  Comments [facility] [address]    History of Present Illness: 2 weeks a go, at a UC sugar was  ~ 300 he went to UC b/c was thirsty, urinating a lot and had blurred vision symptoms started  ~ 3-4 weeks ago   he was started on metformin and ACEi He is doing CBGs, they are around 120   Review of systems He is not thirsty anymore he is not urinating as much as before Denies any nausea, vomiting, diarrhea Vision disturbances persists        Current Medications (verified): 1)  Lopressor 50 Mg Tabs (Metoprolol Tartrate) .... Bid 2)  Norvasc 5 Mg  Tabs (Amlodipine Besylate) .Marland Kitchen.. 1 By Mouth Qd 3)  Lexapro 20 Mg Tabs (Escitalopram Oxalate) .... One By Mouth Daily 4)  Zocor 20 Mg  Tabs (Simvastatin) .... Take One Tablet Daily 5)  Ventolin Hfa 108 (90 Base) Mcg/act Aers (Albuterol Sulfate) .... 2 Puffs Every 6 H As Needed 6)  Vicodin 5-500 Mg Tabs (Hydrocodone-Acetaminophen) .Marland Kitchen.. 1 Every 4 Hours As Needed For Pain 7)  Zolpidem Tartrate 12.5 Mg Cr-Tabs (Zolpidem Tartrate) .Marland Kitchen.. 1 By Mouth At Bedtime Prn 8)  Zegerid 40-1100 Mg Caps (Omeprazole-Sodium Bicarbonate) .Marland Kitchen.. 1 By Mouth Once Daily On A Empty Stomach 9)  Prinivil 5 Mg Tabs (Lisinopril) .Marland Kitchen.. 1 By Mouth Once Daily 10)  Metformin Hcl 500 Mg Tabs (Metformin Hcl) .Marland Kitchen.. 1 By Mouth Two Times A Day  Allergies (verified): No Known Drug Allergies  Past History:  Past Medical History: HYPERTENSION Hyperlipidemia ASTHMA   DEPRESSION INSOMNIA  polyarthralgia (2009): blood work (-), CKs slightly  elevated, bone scan (-), saw rheumatology ; continue w/  symptoms after holding zocor  GERD Diabetes mellitus, type II  Past Surgical History: Reviewed history from 10/23/2010 and no changes required. hand surgery nose surgery Hemorrhoidectomy R knee surgery 2011 (meniscal tear)  Physical Exam  General:  alert and well-developed.  alert and well-developed.   Eyes:  EOMI, ant chambers clear, pupils equal and reactive  Psych:  not anxious appearing and not depressed appearing.     Impression & Recommendations:  Problem # 1:  DIABETES MELLITUS, TYPE II (ICD-250.00) new onset of DM discussed what DM is, role of diet-exercise-meds-long term complications-AiC-cbg goals  plan: increase metformin from 500 two times a day to 850 two times a day anticipate blurred vision will improve  diabetes for dummies book ! nutritionist referal  His updated medication list for this problem includes:    Prinivil 5 Mg Tabs (Lisinopril) .Marland Kitchen... 1 by mouth once daily    Metformin Hcl 850 Mg Tabs (Metformin hcl) .Marland Kitchen... 1 by mouth two times a day  Orders: Venipuncture (45409) TLB-BMP (Basic Metabolic Panel-BMET) (80048-METABOL) TLB-ALT (SGPT) (84460-ALT) TLB-AST (SGOT) (84450-SGOT) TLB-A1C / Hgb A1C (Glycohemoglobin) (83036-A1C) Specimen Handling (81191)  Problem # 2:  F2F > 25 min, > 50% of time counseling-educating  will try to get records   Complete Medication List: 1)  Lopressor 50 Mg Tabs (Metoprolol tartrate) .... Bid 2)  Norvasc 5 Mg Tabs (Amlodipine besylate) .Marland Kitchen.. 1 by mouth qd 3)  Lexapro 20 Mg Tabs (Escitalopram oxalate) .... One by mouth daily 4)  Zocor 20 Mg Tabs (Simvastatin) .... Take one tablet daily 5)  Ventolin Hfa 108 (90 Base) Mcg/act Aers (Albuterol sulfate) .... 2 puffs every 6 h as needed 6)  Vicodin 5-500 Mg Tabs (Hydrocodone-acetaminophen) .Marland Kitchen.. 1 every 4 hours as needed for pain 7)  Zolpidem Tartrate 12.5 Mg Cr-tabs (Zolpidem tartrate) .Marland Kitchen.. 1 by mouth at bedtime prn 8)  Zegerid 40-1100 Mg Caps (Omeprazole-sodium bicarbonate)  .Marland Kitchen.. 1 by mouth once daily on a empty stomach 9)  Prinivil 5 Mg Tabs (Lisinopril) .Marland Kitchen.. 1 by mouth once daily 10)  Metformin Hcl 850 Mg Tabs (Metformin hcl) .Marland Kitchen.. 1 by mouth two times a day 11)  Onetouch Delica Lancets Misc (Lancets) .... Check bs tid- dx- 250.00 12)  Onetouch Ultra Blue Strp (Glucose blood) .... Check bs three times a day dx- 250.00   Patient Instructions: 1)  Diabetes for Dummies! 2)  check sugars twice a day 3)  Please schedule a follow-up appointment in 6  weeks.  Prescriptions: LEXAPRO 20 MG TABS (ESCITALOPRAM OXALATE) one by mouth daily  #30 x 3   Entered by:   Army Fossa CMA   Authorized by:   Nolon Rod. Gibril Mastro MD   Signed by:   Army Fossa CMA on 03/05/2011   Method used:   Electronically to        [id number] (retail)       [address]       [place], [place]  [address]       Ph: [phone] or [phone]       Fax: [phone]   RxID:   [id number] PRINIVIL 5 MG TABS (LISINOPRIL) 1 by mouth once daily  #30 x 3   Entered and Authorized by:   Nolon Rod. Joory Gough MD   Signed by:   Nolon Rod. Taziah Difatta MD on 03/05/2011   Method used:   Electronically to        [id number] (retail)       [address]       [place], [place]  [address]       Ph: [phone] or [phone]       Fax: [phone]   RxID:   [id number] METFORMIN HCL 850 MG TABS (METFORMIN HCL) 1 by mouth two times a day  #60 x 3   Entered and Authorized by:   Nolon Rod. Treina Arscott MD   Signed by:   Nolon Rod. Jayshawn Colston MD on 03/05/2011   Method used:   Print then Give to Patient   RxID:   [id number] [facility] ULTRA BLUE  STRP (GLUCOSE BLOOD) check bs three times a day dx- 250.00  #3 month x 3   Entered by:   Army Fossa CMA   Authorized by:   Nolon Rod. Paislei Dorval MD   Signed by:   Army Fossa CMA on 03/05/2011   Method used:   Electronically to        [id number] (retail)       [address]        [place], [place]  [address]       Ph: [phone] or [phone]  Fax: [phone]   RxID:   [id number] [facility] DELICA LANCETS  MISC (LANCETS) check BS Tid- dx- 250.00  #3 month x 3   Entered by:   Army Fossa CMA   Authorized by:   Nolon Rod. Emily Massar MD   Signed by:   Army Fossa CMA on 03/05/2011   Method used:   Electronically to        [id number] (retail)       [address]       [place], [place]  [address]       Ph: [phone] or [phone]       Fax: [phone]   RxID:   [id number]    Orders Added: 1)  Venipuncture [84166] 2)  TLB-BMP (Basic Metabolic Panel-BMET) [80048-METABOL] 3)  TLB-ALT (SGPT) [84460-ALT] 4)  TLB-AST (SGOT) [84450-SGOT] 5)  TLB-A1C / Hgb A1C (Glycohemoglobin) [83036-A1C] 6)  Specimen Handling [99000] 7)  Est. Patient Level IV [06301]

## 2011-03-27 ENCOUNTER — Encounter: Payer: Self-pay | Admitting: Internal Medicine

## 2011-03-28 ENCOUNTER — Encounter: Payer: Self-pay | Admitting: Internal Medicine

## 2011-03-28 ENCOUNTER — Ambulatory Visit (INDEPENDENT_AMBULATORY_CARE_PROVIDER_SITE_OTHER): Payer: 59 | Admitting: Internal Medicine

## 2011-03-28 DIAGNOSIS — E119 Type 2 diabetes mellitus without complications: Secondary | ICD-10-CM

## 2011-03-28 DIAGNOSIS — R51 Headache: Secondary | ICD-10-CM

## 2011-03-28 DIAGNOSIS — I10 Essential (primary) hypertension: Secondary | ICD-10-CM

## 2011-03-28 LAB — BUN: BUN: 12 mg/dL (ref 6–23)

## 2011-03-28 LAB — CREATININE, SERUM: Creatinine, Ser: 0.8 mg/dL (ref 0.4–1.5)

## 2011-03-28 NOTE — Patient Instructions (Signed)
Please hold the metformin until instructed to  restart it.  Please take the Vicodin 1-2 every 4 hours  As needed for  the  headache response.

## 2011-03-28 NOTE — Progress Notes (Signed)
Subjective:    Patient ID: Daniel [name], male    DOB: 12/14/1966, 45 y.o.   MRN: [id number]  HPI HEADACHE   Onset: 2 weeks ago w/o trigger  Location: R posterior skull Quality: dull, burning with occasional "burst of stabbing pain" Frequency: "constant " in past 2 weeks Precipitating factors: no Prior treatment: "Migraine Shot"  @ UC ? 04/05 (" it only put me to sleep"); Flexeril Rxed w/o benefit . Vicodin from prior knee surgery didn't help.  Associated Symptoms Nausea/vomiting: yes, vomiting up to 2X/ day  Photophobia/phonophobia: no  Tearing of eyes: no  Sinus pain/pressure: no  Family hx migraine: no  Personal stressors: no    Red Flags Fever: no  Neck pain/stiffness: no  Vision/speech/swallow/hearing difficulty: yes, "lights went out for a second " 1 week ago  Focal weakness/numbness: no  Altered mental status: no  Trauma: no (Note: PMH fractured nose post fall in dark 2006) New type of headache: yes, "totally unlike migraines"  Anticoagulant use: no  H/o cancer/HIV: no, DM (see below)     CHRONIC DIABETES  Disease Monitoring  Blood Sugar Ranges: FBS 70-120  Polyuria: no   Visual problems: no   Medication Compliance: yes  Medication Side Effects  Hypoglycemia: no   Preventitive Health Care  Eye Exam: yes  Foot Exam: yes  Diet pattern: no specific diet  Exercise: yes; 5X / week( Note: 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 Alert, appropriate and cooperative throughout exam. Head: Normocephalic without obvious abnormalities;minimally tender R posterior occiput Eyes: No corneal or conjunctival inflammation noted. Pupils equal round reactive to light and accommodation. Fundal exam is benign without hemorrhages, exudate, papilledema. Extraocular motion  intact. Vision grossly normal.FOV normal. Slight ptosis; slight OS exotropia Ears: External  ear exam reveals no significant lesions or deformities. Canals clear .TMs normal. Hearing is grossly normal bilaterally. Nose: External nasal exam reveals no deformity or inflammation. Nasal mucosa are pink and moist. No lesions or exudates noted.  Mouth: Oral mucosa and oropharynx reveal no lesions or exudates. Teeth in good repair. Neck: No deformities, masses, or tenderness noted. Range of motion normal.  Lungs: Normal respiratory effort; chest expands symmetrically. Lungs are clear to auscultation without rales, wheezes, or increased work of breathing. Heart: Normal rate and rhythm. Normal S1 and S2. No gallop, click, or rub. No murmur. Musculoskeletal/extremities:  No clubbing, cyanosis, edema, or deformity noted. Range of motion  normal .Tone & strength  normal.Joints normal. Nail health  good. Vascular: Carotid &  radial artery pulses are full and equal. No bruits present. Neurologic: Alert and oriented x3. Deep tendon reflexes symmetrical and normal. Cranial nerves intact. Light touch normal over face. Gait; Romberg , finger to nose normal. Skin: Intact without suspicious lesions or rashes.  Lymph: No cervical, axillary, or inguinal lymphadenopathy present.  Psych: Mood and affect are normal. Normally interactive  Assessment & Plan:  #1 right occipital headache; the headache is now constant and unresponsive to Vicodin. Has been present for several weeks.  #2 past history of migraines  #3 past medical history head trauma  #4 diabetes; he is on metformin  Plan #1 CAT scan will be performed; the metformin will be held. Preprocedure BUN and creatinine will be checked.

## 2011-03-29 ENCOUNTER — Ambulatory Visit (INDEPENDENT_AMBULATORY_CARE_PROVIDER_SITE_OTHER)
Admission: RE | Admit: 2011-03-29 | Discharge: 2011-03-29 | Disposition: A | Discharge status: Home / Self Care | Payer: 59 | Source: Ambulatory Visit | Attending: Internal Medicine | Admitting: Internal Medicine

## 2011-03-29 ENCOUNTER — Telehealth: Payer: Self-pay | Admitting: *Deleted

## 2011-03-29 DIAGNOSIS — R51 Headache: Secondary | ICD-10-CM

## 2011-03-29 NOTE — Telephone Encounter (Signed)
The CT is negative; the mild mastoid changes would not explain the symptoms. If the headache is not controlled with the pain medications; I do recommend referral to a headache clinic.      Pt is aware.

## 2011-04-23 ENCOUNTER — Other Ambulatory Visit: Payer: Self-pay | Admitting: Internal Medicine

## 2011-04-30 ENCOUNTER — Other Ambulatory Visit: Payer: Self-pay | Admitting: Internal Medicine

## 2011-05-02 ENCOUNTER — Encounter: Payer: Self-pay | Admitting: *Deleted

## 2011-05-02 ENCOUNTER — Ambulatory Visit (INDEPENDENT_AMBULATORY_CARE_PROVIDER_SITE_OTHER): Payer: 59 | Admitting: Internal Medicine

## 2011-05-02 ENCOUNTER — Encounter: Payer: Self-pay | Admitting: Internal Medicine

## 2011-05-02 DIAGNOSIS — Z0000 Encounter for general adult medical examination without abnormal findings: Secondary | ICD-10-CM | POA: Insufficient documentation

## 2011-05-02 DIAGNOSIS — E785 Hyperlipidemia, unspecified: Secondary | ICD-10-CM

## 2011-05-02 DIAGNOSIS — R51 Headache: Secondary | ICD-10-CM

## 2011-05-02 DIAGNOSIS — I10 Essential (primary) hypertension: Secondary | ICD-10-CM

## 2011-05-02 DIAGNOSIS — E119 Type 2 diabetes mellitus without complications: Secondary | ICD-10-CM

## 2011-05-02 LAB — CBC WITH DIFFERENTIAL/PLATELET
Basophils Absolute: 0 10*3/uL (ref 0.0–0.1)
Eosinophils Absolute: 0.4 10*3/uL (ref 0.0–0.7)
HCT: 43.3 % (ref 39.0–52.0)
Lymphs Abs: 3.3 10*3/uL (ref 0.7–4.0)
MCV: 88.9 fl (ref 78.0–100.0)
Monocytes Absolute: 0.4 10*3/uL (ref 0.1–1.0)
Monocytes Relative: 5.1 % (ref 3.0–12.0)
Platelets: 139 10*3/uL — ABNORMAL LOW (ref 150.0–400.0)
RDW: 13.1 % (ref 11.5–14.6)

## 2011-05-02 LAB — LIPID PANEL
Cholesterol: 135 mg/dL (ref 0–200)
Total CHOL/HDL Ratio: 4
Triglycerides: 231 mg/dL — ABNORMAL HIGH (ref 0.0–149.0)

## 2011-05-02 LAB — LDL CHOLESTEROL, DIRECT: Direct LDL: 77.8 mg/dL

## 2011-05-02 LAB — ALT: ALT: 32 U/L (ref 0–53)

## 2011-05-02 MED ORDER — LOSARTAN POTASSIUM 50 MG PO TABS: 50.0000 mg | ORAL_TABLET | Freq: Every day | ORAL | Status: DC

## 2011-05-02 MED ORDER — SITAGLIPTIN PHOSPHATE 100 MG PO TABS: 100.0000 mg | ORAL_TABLET | Freq: Every day | ORAL | Status: DC

## 2011-05-02 NOTE — Assessment & Plan Note (Signed)
Completely resolved after he stopped artificial sweeteners. CT of the head was negative.

## 2011-05-02 NOTE — Assessment & Plan Note (Signed)
Will control, the patient however has developed a cough. Will change from ACE to ARBs

## 2011-05-02 NOTE — Assessment & Plan Note (Addendum)
Developed diarrhea since he started Glucophage. CBGs 70 to 120 currently Will switch to [place], consider Amaryl depending on results.

## 2011-05-02 NOTE — Assessment & Plan Note (Addendum)
Td 2006 never had a  Cscope, no family history colon cancer will start doing PSAs every two years until age 45,  then  yearly  ( patient is African-American). Next DRE and PSA 2013 sees dentist routinely Encouraged to continue with a healthy lifestyle

## 2011-05-02 NOTE — Patient Instructions (Addendum)
Call in 2 weeks and let us know how your sugars are doing. Check the  blood pressure 2 or 3 times a week, be sure it is less than 140/85. If it is consistently higher, let me know

## 2011-05-02 NOTE — Progress Notes (Signed)
  Subjective:    Patient ID: Daniel [name], male    DOB: 1966/10/09, 45 y.o.   MRN: [id number]  HPI CPX  Past Medical History  Diagnosis Date  . Hypertension   . Hyperlipidemia   . Asthma   . Depression   . Insomnia   . Polyarthralgia 2009    blood work (-) CKs slightly elevated, bone san (-) saw rheumatology; continue w/ somptoms after holding zocor  . GERD (gastroesophageal reflux disease)   . Diabetes mellitus    Past Surgical History  Procedure Date  . Hand surgery   . Nose surgery   . Hemorrhoid surgery   . 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 diet-- improving x the last few weeks  very active at work plus exercises     Review of Systems HAs resolved inmediately after he stopped artificial sweeteners DM-- good med compliance, noted diarrhea since he started metformin, no blood in the stools, stools are watery and had 3 episodes daily. He also has noticed chronic cough, sometimes intense and associated with nausea and vomiting since he started the ACE inhibitor. No chest pain or shortness of breath, some fatigue. He had blurred vision, that has improved. No abdominal pain. No disorder gross hematuria. No lower extremity edema.     Objective:   Physical Exam  Constitutional: He is oriented to person, place, and time. He appears well-developed and well-nourished.  HENT:  Head: Normocephalic and atraumatic.  Neck: No thyromegaly present.  Cardiovascular: Normal rate, regular rhythm and normal heart sounds.   No murmur heard. Pulmonary/Chest: No respiratory distress. He has no wheezes. He has no rales.  Abdominal: Soft. He exhibits no distension. There is no tenderness. There is no rebound and no guarding.    Musculoskeletal: He exhibits no edema.  Neurological: He is alert and oriented to person, place, and time.  Skin: Skin is warm and dry.  Psychiatric: He has a normal mood and affect. His behavior is normal. Judgment and thought content normal.        Assessment & Plan:  In addition to his CPX we spent > 15 min managing his chronic medical problems.  See assessment and plan

## 2011-05-02 NOTE — Assessment & Plan Note (Signed)
Last cholesterol panel was satisfactory however he now has diabetes. Goals have changed. Labs

## 2011-05-03 ENCOUNTER — Telehealth: Payer: Self-pay | Admitting: *Deleted

## 2011-05-03 NOTE — Telephone Encounter (Signed)
Message left for patient to return my call.  

## 2011-05-03 NOTE — Telephone Encounter (Signed)
Message copied by Army Fossa on Thu May 03, 2011  2:15 PM ------      Message from: Daniel [name]      Created: Thu May 03, 2011  2:04 PM       Advise patient:      His diabetes is under excellent control, currently a 6.0.      Cholesterol is satisfactory only the triglycerides are slightly high.      Platelets are also slightly low. Will recheck in 6 weeks when he comes back

## 2011-05-04 NOTE — Op Note (Signed)
NAME:  Daniel [name], Daniel [name]               ACCOUNT NO.:  [id number]   MEDICAL RECORD NO.:  [id number]          PATIENT TYPE:  AMB   LOCATION:  [facility]SC                          FACILITY:  [facility]   PHYSICIAN:  Katy Fitch. Sypher, M.D. DATE OF BIRTH:  1966-03-26   DATE OF PROCEDURE:  07/26/2005  DATE OF DISCHARGE:                                 OPERATIVE REPORT   PREOPERATIVE DIAGNOSIS:  Status post crushing injury of left index finger on  December 29, 2004, leading to a profundus tendon rupture at the level of the  A4 pulley, status post repair of flexor digitorum profundus tendon and A4  pulley by Dr. Amanda Pea on January 06, 2005.   POSTOPERATIVE DIAGNOSIS:  Status post crushing injury of left index finger  on December 29, 2004, leading to a profundus tendon rupture at the level of  the A4 pulley, status post repair of flexor digitorum profundus tendon and  A4 pulley by Dr. Amanda Pea on January 06, 2005.   OPERATION:  1.  Tenolysis of left index finger flexor digitorum profundus tendon.  2.  Distal tenolysis of radial and ulnar slips of flexor digitorum      superficialis, left index finger.   OPERATING SURGEON:  Katy Fitch. Sypher, M.D.   ASSISTANT:  Molly Maduro Dasnoit PA-C.   ANESTHESIA:  IV regional at proximal forearm level supplemented by IV  sedation and at the conclusion of the case, a digital block was placed at  the left index finger metacarpal head level for postoperative analgesia to  facilitate active range of motion exercises.   SUPERVISING ANESTHESIOLOGIST:  Zenon Mayo, MD   INDICATIONS:  Daniel [name] is a 45 year old right-hand-dominant gentleman  who sustained a crushing injury to his left index finger in January of 2006.   At that time, he was referred to [facility] for evaluation and  subsequently was referred to see Dr. Dominica Severin for a hand consult.   Dr. Amanda Pea determined that he had a rupture of his profundus tendon and A4  pulley based on an MRI evaluation  and recommended surgical exploration and  repair.   Surgery was accomplished on January 06, 2005.   Following surgery, Daniel [name] was able to fully recover active range of  motion at the PIP joint; however, he was unable to recover any active  flexion at the DIP joint.  He had passive flexion of 60 degrees.   Daniel [name] presented for consultation on June 11, 2005 after was advised  to consider tenolysis versus staged tendon reconstruction.   His closest friend recently underwent reconstructive surgery with our office  and he elected to transfer his care.   On clinical examination, he was noted to have signs of significant tenodesis  of his left index profundus tendon with adequate glide of the superficialis  to allow 90 degrees of active flexion at the PIP joint.   We recommended tenolysis under regional anesthesia and local finger block  verses tenodesis of the distal stump of the tendon, should he be found to  have recurrent rupture.   He is brought to the operating room  at this time, anticipating either  tenolysis or distal tenodesis of the profunda stump.   After informed consent, he is brought to the operating room.   PROCEDURE:  Daniel [name] was brought to the operating room and placed in  supine position upon the operating table.   Following an anesthesia consultation with Dr. Sampson Goon, an IV regional  block was placed at proximal forearm level.   After 15 minutes, anesthesia in the left arm was satisfactory.   The arm was prepped with Betadine soap and solution and sterilely draped.  One gram of Ancef was administered as an IV prophylactic antibiotic.   The procedure commenced with incision through the previous Brunner zigzag  incision fashioned by Dr. Amanda Pea.  Subcutaneous tissue were carefully  dissected, taking care to spare the radial and ulnar neurovascular bundles.  The flexor sheath was identified at the level of the distal A2 pulley and  dissected  distally.  The A2, C1 and A3 pulleys, and the C2 pulley were  basically intact.   Nylon sutures placed by Dr. Amanda Pea to repair the sheath were carefully  removed with a 64 beaver blade and microforceps.   The profundus tendon and A4 pulley were indistinguishably blended in a  single scar.   With a significant degree of care, the tendon was tenolysed from the A4  pulley utilizing tenotomy scissors, a  [employer]er [employer] tenotomy knives.   Ultimately, satisfactory glide of the profundus was achieved across the  middle phalangeal and distal phalangeal segments of the finger, with  particular attention to releasing the profundus from the volar plate  distally.   With the use of an Allis clamp, I was able to recover 80 degrees of flexion  at the DIP joint and satisfactory glide of the proximal profundus through  the decussation proximally.   The distal slips of the superficialis tendon were densely adherent to the  middle phalanx and the volar plate region of the PIP joint.   These were tenolysed with tenotomy scissors and the spatula-type tenotomy  knife.   Daniel [name] was then awakened from sedation and a digital block placed at  the metacarpal head level.   The tourniquet was released from the proximal forearm and after 10 minutes,  Daniel [name] demonstrated full active flexion of his finger with 110 degrees  of flexion at PIP joint and 75 degrees of flexion of the DIP joint.   He could bring his fingertip to the palm.   His wounds were irrigated, hemostasis was achieved with bipolar cautery  followed by repair of the skin with a horizontal mattress suture of 5-0  nylon.  There were no apparent complications.   For aftercare, Daniel [name] is provided prescriptions for Keflex 500 mg one  p.o. q.8 h. x4 days as a prophylactic antibiotic and Percocet 5 mg one or  two p.o. q.4-6 h. pain, 30 tablets without refill.  He will return to our office for followup on July 30, 2005 to  begin range  of motion exercises under the supervision of our therapist.       RVS/MEDQ  D:  07/26/2005  T:  07/26/2005  Job:  [id number]

## 2011-05-04 NOTE — Op Note (Signed)
NAME:  Daniel [name], BACHA NO.:  [id number]   MEDICAL RECORD NO.:  [id number]          PATIENT TYPE:  OIB   LOCATION:  5017                         FACILITY:  [facility]   PHYSICIAN:  Dionne Ano. Gramig III, M.D.DATE OF BIRTH:  1966-04-29   DATE OF PROCEDURE:  01/06/2005  DATE OF DISCHARGE:                                 OPERATIVE REPORT   PREOPERATIVE DIAGNOSIS:  Left index finger flexor digitorum profundus  avulsion.   POSTOPERATIVE DIAGNOSIS:  Left index finger flexor digitorum profundus  avulsion with noted A4 pulley injury.   PROCEDURE:  1.  Left index finger flexor digitorum profundus repair in zone 1-2      junction, this is primarily a zone 2 type repair.  2.  Reconstruction A4 pulley region.   SURGEON:  Dionne Ano. Amanda Pea, M.D.   ASSISTANT:  Karie Chimera, P.A.-C.   COMPLICATIONS:  None.   SPECIMENS:  One.   ESTIMATED BLOOD LOSS:  Minimal.   TOURNIQUET TIME:  Less than 90 minutes.   INDICATIONS FOR PROCEDURE:  The patient is a 45 year old male who presents  with the above mentioned diagnosis.  I have counseled him in regards to the  risks and benefits of surgery including the risks of infection, bleeding,  anesthesia, damage to normal structures, failure to improve, and she desires  to proceed.  All questions have been encouraged and answered preoperatively.   PROCEDURE IN DETAIL:  The patient was seen by myself and anesthesia, given  preoperative antibiotics in the form of Ancef, laid supine, appropriately  padded, prepped and draped in the usual sterile fashion about the left upper  extremity with Betadine scrub and paint after a general anesthesia was  employed.  This was an LMA general and this was instituted by the anesthesia  department.  Once this was done, the patient was securely padded.  We  performed an evaluation under anesthesia which confirmed the tendon injury,  of course.  I then made a volar approach to the finger, this was a  modified  Brunner's zig-zag incision which allowed for skin flap elevation.  I  carefully protected the neurovascular bundles and immediately encountered  the zone of injury.  He had attenuation to the third and fourth pulley.  A2  pulley was normal.  I opened the A3 pulley at the A3-A2 junction, identified  the ruptured tendon, retrieved this, and then placed a 25 gauge needle about  it to hold it in place.  I then placed a 4-0 FiberWire suture with modified  Kessler to [employer]  Following this, I threaded it underneath the A3  pulley, of course.  I then identified the distal portion of A3 which was  injured and the A4 pulley.  The patient did not completely avulse this from  his bone but, rather, had a 1 to 1 1/2 cm area of tendon about the flexor  digitorum profundus which was left.  Thus, with this type of length  remaining, I could not simply advance the tendon back to the distal phalanx  and, rather, had to perform a repair of tendon ends.  I, thus, placed the  distal stump underneath a portion of A4 which was attenuated in the  cruciform pulley region.  This was done to my satisfaction without  difficulty.  I then kept the finger flexed, placed a core suture in modified  Kessler to [place] fashion a 4-0 FiberWire and, following this, placed a 6-0  nylon suture on the back side.  I then placed an additional 4-0 FiberWire  suture, modified Kessler in nature.  Thus, the patient had a four strand  FiberWire suture placed.  This was done to my satisfaction without  difficulty.  The four strand repair was accomplished without difficulty and  was tied down nicely.  Following this, I repaired the opposite end of the  wall, the palmar end, with nylon sutures.  Thus, the patient had a four  strand repair with a running 6-0 epitendinous suture to prevent bunching.  Following this, I then repaired the interval between the A2 and A3 pulley  and I repaired the A4 remnants.  This was a very  interesting injury and was  sort of a blow out injury about the A3 and A4 juncture.  Instead of the  typical digitorum profundus avulsion off the bone, this was an avulsion  which occurred mid substance.  There was some significant inflammatory  tissue in the sheath and I did send this for specimen to rule out  abnormality.  Following repair of the pulleys with 6-0 nylon, I then  deflated the tourniquet, applied copious amounts of irrigation to the wound,  closed the wound with interrupted Prolene and placed Marcaine without  epinephrine for postop analgesia.  A sterile dressing was applied, dorsal  block splint with the MCP joints in 70 degrees and the PIP joint slightly  flexed were placed.  He tolerated this well, there were no complicating  features.  I should note that prior to closure, the patient did have  excursion and no significant tendon bunching.  He was transferred to the  recovery room in stable condition.  All sponge, needle, and instrument  counts were reported as correct.  We will give him an additional dose of  Ancef in the recovery room and keep him overnight for IV antibiotics,  observation, and general postop measures.  I have discussed with him the dos  and don'ts, etc., and all questions have been encouraged and answered.       WMG/MEDQ  D:  01/06/2005  T:  01/06/2005  Job:  [id number]

## 2011-05-04 NOTE — Telephone Encounter (Signed)
Message left for patient to return my call.  

## 2011-05-04 NOTE — Op Note (Signed)
NAME:  Daniel [name], HANDS NO.:  [id number]   MEDICAL RECORD NO.:  [id number]                   PATIENT TYPE:  AMB   LOCATION:  DSC                                  FACILITY:  [facility]   PHYSICIAN:  Jimmye Norman III, M.D.               DATE OF BIRTH:  1966/04/12   DATE OF PROCEDURE:  12/23/2003  DATE OF DISCHARGE:                                 OPERATIVE REPORT   PREOPERATIVE DIAGNOSIS:  Anorectal pain with hemorrhoids.   POSTOPERATIVE DIAGNOSIS:  Internal and external hemorrhoids at the 1 o'clock  position.   PROCEDURE:  1. Rigid sigmoidoscopy and/or proctoscopy.  2. Exam under anesthesia.  3. Internal and external closed hemorrhoidectomy.   SURGEON:  Jimmye Norman, M.D.   ASSISTANT:  None.   ANESTHESIA:  General endotracheal anesthesia.   ESTIMATED BLOOD LOSS:  Less than 20-30 mL.   COMPLICATIONS:  None.   CONDITION:  Stable.   INDICATIONS FOR PROCEDURE:  The patient is a 45 year old with recurrent pain  after being treated conservatively initially.  He comes back in now with  perianal pain.   FINDINGS:  On rigid sigmoidoscopy, the patient had no polyps or lesion  requiring biopsy.  On anorectal exam, he had hemorrhoid complex at 1 o'clock  and a second one at approximately 7-8 o'clock position with 12 o'clock being  directly posteriorly.  The one at the 1 o'clock position was the most full  and seen to have a thrombus deep inside the internal complex which was why  we did the hemorrhoidectomy at that site.   PROCEDURE IN DETAIL:  The patient was taken to the operating room and placed  on the table initially in the supine position.  After an adequate  endotracheal anesthesia was administered, he was flipped into the jackknife  prone position and prepped with Betadine.  We did the sigmoidoscopy first  initially and got all the way up to 22 cm where there were no lesions found.  The patient had an adequate prep.  No biopsies were done during  the  sigmoidoscopy and we removed it without event, making sure to analyze the  hemorrhoidal complexes.  We then used the anoscope in order to examine the  perianal and distal rectal area.  The patient had two prominent hemorrhoidal  complexes, one at the 1 o'clock position and one at the 7 o'clock position.  The one at the 1 o'clock position had some palpable thrombus in the internal  portion but did not appear to be acutely inflamed and was not bleeding.  However, because of his pain, we did an internal and external  hemorrhoidectomy by placing a stitch at the base of the internal  hemorrhoidal complex, using a 15 blade to excise the internal and external  hemorrhoidal complex, and cautery to complete the excision.   We then sutured it closed using a running locking stitch of 3-0 chromic with  reinforcing hemostatic sutures along the suture lines.  These hemostatic  sutures were done with 3-0 chromic.  Once we had adequate hemostasis, we  packed the anal area with Dibucaine soaked Gelfoam.  We then injected 8 mL  of Marcaine into the anorectal sphincter area.                                               Kathrin Ruddy, M.D.    JW/MEDQ  D:  12/23/2003  T:  12/23/2003  Job:  [id number]

## 2011-05-04 NOTE — Telephone Encounter (Signed)
Spoke w/ pt aware of labs.  

## 2011-05-07 ENCOUNTER — Other Ambulatory Visit: Payer: Self-pay | Admitting: Internal Medicine

## 2011-05-07 NOTE — Telephone Encounter (Signed)
Ok 60, no RF 

## 2011-06-14 ENCOUNTER — Encounter: Payer: Self-pay | Admitting: Internal Medicine

## 2011-06-14 ENCOUNTER — Ambulatory Visit (INDEPENDENT_AMBULATORY_CARE_PROVIDER_SITE_OTHER): Payer: 59 | Admitting: Internal Medicine

## 2011-06-14 DIAGNOSIS — E119 Type 2 diabetes mellitus without complications: Secondary | ICD-10-CM

## 2011-06-14 DIAGNOSIS — E785 Hyperlipidemia, unspecified: Secondary | ICD-10-CM

## 2011-06-14 DIAGNOSIS — R609 Edema, unspecified: Secondary | ICD-10-CM

## 2011-06-14 DIAGNOSIS — I10 Essential (primary) hypertension: Secondary | ICD-10-CM

## 2011-06-14 MED ORDER — CARVEDILOL 6.25 MG PO TABS: 6.2500 mg | ORAL_TABLET | Freq: Two times a day (BID) | ORAL | Status: DC

## 2011-06-14 NOTE — Patient Instructions (Signed)
Decrease Januvia to 1/2 pill  Because A1c is 6 %.Preventive Health Care: Exercise at least 30-45 minutes a day,  3-4 days a week.  Eat a low-fat diet with lots of fruits and vegetables, up to 7-9 servings per day.Consume less than 40 grams of sugar per day from foods & drinks with High Fructose Corn Sugar as #1,2,3 or # 4 on label. Please  schedule fasting Labs in 3 months  : BMET,Lipids, A1c.   The A1c test is used primarily to monitor the glucose control of diabetics over time. The goal of those with diabetes is to keep their blood glucose levels as close to normal as possible. This helps to minimize the complications caused by chronically elevated glucose levels, such as progressive damage to body organs like the kidneys, eyes, cardiovascular system, and nerves. The A1c test gives a picture of the average amount of glucose in the blood over the last few months. It can help a patient and his doctor know if the measures they are taking to control the patient's diabetes are successful or need to be adjusted.   NORMAL VALUES  Non diabetic adults: 5 %-6.1%  Good diabetic control: 6.2-6.4 %  Fair diabetic control: 6.5-7%  Poor diabetic control: greater than 7 % ( except with additional factors such as  advanced age; significant coronary or neurologic disease,etc).  Goals for home glucose monitoring are : fasting  or morning glucose goal of  90-150. Two hours after any meal , goal = < 180, preferably < 150.

## 2011-06-14 NOTE — Progress Notes (Signed)
  Subjective:    Patient ID: Daniel [name], male    DOB: 1966-02-24, 45 y.o.   MRN: [id number]  HPI Edema Onset:6/27 Constitutional:  No Fatigue; sleep disturbance                                                        Cardiovascular: no Palpitations; tachycardia; claudication; paroxysmal nocturnal dyspnea Pulmonary: no Cough; sputum reduction; exertional dyspnea Endocrinologic:no Weight change; temperature intolerance; skin/hair/nail changes Possible triggers: no Salt  excess; new medications . Note: he is on   Amlopidine     Review of Systems     Objective:   Physical Exam Gen.: Healthy and well-nourished in appearance. Alert, appropriate and cooperative throughout exam. Neck: No deformities, masses, or tenderness noted.  Thyroid normal. No NVD Lungs: Normal respiratory effort; chest expands symmetrically. Lungs are clear to auscultation without rales, wheezes, or increased work of breathing. Heart: Slow  rate and  Regular  rhythm. Normal S1 and S2. No gallop, click, or rub. S4 with slight slurring; no  murmur. Abdomen: Bowel sounds normal; abdomen soft and nontender. No masses, organomegaly or hernias noted.No HJR No clubbing, cyanosis,  or deformity noted. ! + leg edema. Nail health  good. Vascular: Carotid, radial artery, dorsalis pedis and dorsalis posterior tibial pulses are full and equal. No bruits present. Neurologic: Alert and oriented x3. Deep tendon reflexes symmetrical and normal.          Skin: Intact without suspicious lesions or rashes. Psych: Mood and affect are normal. Normally interactive                                                                                         Assessment & Plan:  #1 edema #2 DM ;  A1c 6 % ( 9.5% in 03/12) . #3 elevated Triglycerides Plan: #1 Stop Amlodipine  & Metoprolol . Start Carvedilol #2Decrease Januvia to 1/2 daily; check A1c in 3 mos #3 avoid High Fructose Corn Syrup sugar; fasting Lipids in 3 mos

## 2011-07-11 ENCOUNTER — Other Ambulatory Visit: Payer: Self-pay | Admitting: Internal Medicine

## 2011-08-02 ENCOUNTER — Encounter: Payer: Self-pay | Admitting: Internal Medicine

## 2011-08-02 ENCOUNTER — Ambulatory Visit (INDEPENDENT_AMBULATORY_CARE_PROVIDER_SITE_OTHER): Payer: 59 | Admitting: Internal Medicine

## 2011-08-02 DIAGNOSIS — E119 Type 2 diabetes mellitus without complications: Secondary | ICD-10-CM

## 2011-08-02 DIAGNOSIS — E785 Hyperlipidemia, unspecified: Secondary | ICD-10-CM

## 2011-08-02 DIAGNOSIS — I10 Essential (primary) hypertension: Secondary | ICD-10-CM

## 2011-08-02 LAB — LIPID PANEL
Cholesterol: 176 mg/dL (ref 0–200)
Total CHOL/HDL Ratio: 4
Triglycerides: 237 mg/dL — ABNORMAL HIGH (ref 0.0–149.0)

## 2011-08-02 LAB — BASIC METABOLIC PANEL
BUN: 13 mg/dL (ref 6–23)
CO2: 26 mEq/L (ref 19–32)
Chloride: 102 mEq/L (ref 96–112)
Creatinine, Ser: 1 mg/dL (ref 0.4–1.5)
Glucose, Bld: 151 mg/dL — ABNORMAL HIGH (ref 70–99)

## 2011-08-02 MED ORDER — HYDROCODONE-ACETAMINOPHEN 5-500 MG PO TABS: 1.0000 | ORAL_TABLET | ORAL | Status: DC | PRN

## 2011-08-02 MED ORDER — LOSARTAN POTASSIUM 100 MG PO TABS: 100.0000 mg | ORAL_TABLET | Freq: Every day | ORAL | Status: DC

## 2011-08-02 NOTE — Assessment & Plan Note (Signed)
Dx 02-2011, h/o glucophage intolerance d/t diarrhea On januvia 100mg  labs

## 2011-08-02 NOTE — Patient Instructions (Signed)
Check the  blood pressure 2 or 3 times a week, be sure it between 110/70 and  140/85. If it is consistently higher or lower , let me know

## 2011-08-02 NOTE — Assessment & Plan Note (Signed)
TG were elevated on last FLP . Labs

## 2011-08-02 NOTE — Progress Notes (Signed)
Subjective:    Patient ID: Daniel [name], male    DOB: [birth year], 45 y.o.   MRN: [id number]  HPI Edema--improved since he discontinue amlodipine HTN--good compliance with new set of many things, see previous visit. BP today elevated, states he is upset because he almost crashed his car today before coming to the office. No ambulatory blood pressures. DM-- was rec to decreased [place] but still on 100mg , feeling well  Past Medical History  Diagnosis Date  . Hypertension   . Hyperlipidemia   . Asthma   . Depression   . Insomnia   . Polyarthralgia 2009    blood work (-) CKs slightly elevated, bone san (-) saw rheumatology; continue w/ somptoms after holding zocor  . GERD (gastroesophageal reflux disease)   . Diabetes mellitus    Past Surgical History  Procedure Date  . Hand surgery   . Nose surgery   . Hemorrhoid surgery   . Knee surgery 2011     Right- meniscal tear      Review of Systems No chest pain or shortness or breath No nausea, vomiting, diarrhea No symptoms consistent with low blood sugars. Diet has been poor for the last 2 weeks, he has been on medication    Objective:   Physical Exam  Constitutional: He is oriented to person, place, and time. He appears well-developed and well-nourished. No distress.  HENT:  Head: Normocephalic and atraumatic.  Neck: No thyromegaly present.  Cardiovascular: Normal rate, regular rhythm and normal heart sounds.   Pulmonary/Chest: No respiratory distress. He has no wheezes. He has no rales.  Musculoskeletal: He exhibits no edema.  Neurological: He is alert and oriented to person, place, and time.  Skin: He is not diaphoretic.  Psychiatric: He has a normal mood and affect. His behavior is normal. Judgment and thought content normal.          Assessment & Plan:   Current Medications Are     albuterol (VENTOLIN HFA) 108 (90 BASE) MCG/ACT inhaler (Taking)  Inhale 2 puffs into the lungs every 6 (six) hours as needed.      carvedilol (COREG) 6.25 MG tablet (Taking)  Take 1 tablet (6.25 mg total) by mouth 2 (two) times daily. In place of Metoprolol   escitalopram (LEXAPRO) 20 MG tablet (Taking)  take 1 tablet by mouth once daily   fish oil-omega-3 fatty acids 1000 MG capsule (Taking)  Take 1 g by mouth daily.    Flaxseed, Linseed, (FLAX SEED OIL PO) (Taking)  Take 1 each by mouth at bedtime.    glucose blood (ONE TOUCH ULTRA TEST) test strip (Taking)  1 each by Other route 3 (three) times daily. Use as instructed    HYDROcodone-acetaminophen (VICODIN) 5-500 MG per tablet (Taking)  Take 1 tablet by mouth every 4 (four) hours as needed for pain.   Multiple Vitamins-Minerals (MULTIVITAMIN,TX-MINERALS) tablet (Taking)  Take 1 tablet by mouth daily.    omeprazole-sodium bicarbonate (ZEGERID) 40-1100 MG per capsule (Taking)  Take 1 capsule by mouth daily before breakfast.    ONETOUCH DELICA LANCETS MISC (Taking)  by Does not apply route 3 (three) times daily.    simvastatin (ZOCOR) 20 MG tablet (Taking)  take 1 tablet by mouth once daily   sitaGLIPtan (JANUVIA) 100 MG tablet (Taking)  Take 1 tablet (100 mg total) by mouth daily.   zolpidem (AMBIEN CR) 12.5 MG CR tablet (Taking)  Take 12.5 mg by mouth at bedtime as needed.    losartan (COZAAR) 100 MG  tablet  Take 1 tablet (100 mg total) by mouth daily.

## 2011-08-02 NOTE — Assessment & Plan Note (Addendum)
Edema resolved after amlodipine d/c No amb BPs, BP today slt elevated  Plan: Increase cozaar from 50 to 100, see instructions

## 2011-08-05 ENCOUNTER — Other Ambulatory Visit: Payer: Self-pay | Admitting: Internal Medicine

## 2011-08-06 NOTE — Telephone Encounter (Signed)
Rx Done . 

## 2011-08-17 ENCOUNTER — Other Ambulatory Visit: Payer: Self-pay | Admitting: Internal Medicine

## 2011-08-18 ENCOUNTER — Other Ambulatory Visit: Payer: Self-pay | Admitting: Internal Medicine

## 2011-08-21 ENCOUNTER — Other Ambulatory Visit: Payer: Self-pay | Admitting: Internal Medicine

## 2011-08-21 MED ORDER — ZOLPIDEM TARTRATE ER 12.5 MG PO TBCR: 12.5000 mg | EXTENDED_RELEASE_TABLET | Freq: Every evening | ORAL | Status: DC | PRN

## 2011-08-21 NOTE — Telephone Encounter (Signed)
Zolpidem request [last refill 01/23/11 #90x1]

## 2011-08-21 NOTE — Telephone Encounter (Signed)
Zolpidem [last refill 01/23/11 #90x1]

## 2011-08-21 NOTE — Telephone Encounter (Signed)
90 and 2 RF

## 2011-08-21 NOTE — Telephone Encounter (Signed)
Rx Done . 

## 2011-08-24 NOTE — Telephone Encounter (Signed)
We called 90 tablets with refills on 08-21-11. If that prescription to went to a mail pharmacy , then ok to call #30, no Rf (to his local pharmacy)

## 2011-08-28 NOTE — Telephone Encounter (Signed)
Per pharmacist, recvd 08/21/11 Rx for medication requested. Disregard.

## 2011-11-10 ENCOUNTER — Other Ambulatory Visit: Payer: Self-pay | Admitting: Internal Medicine

## 2011-12-05 ENCOUNTER — Other Ambulatory Visit: Payer: Self-pay | Admitting: Internal Medicine

## 2011-12-18 ENCOUNTER — Other Ambulatory Visit: Payer: Self-pay | Admitting: Internal Medicine

## 2012-01-22 ENCOUNTER — Other Ambulatory Visit: Payer: Self-pay | Admitting: Internal Medicine

## 2012-01-23 NOTE — Telephone Encounter (Signed)
Refill done.  

## 2012-02-05 ENCOUNTER — Other Ambulatory Visit: Payer: Self-pay | Admitting: Internal Medicine

## 2012-02-05 NOTE — Telephone Encounter (Signed)
Refill done.  

## 2012-02-18 ENCOUNTER — Other Ambulatory Visit: Payer: Self-pay | Admitting: Internal Medicine

## 2012-02-19 NOTE — Telephone Encounter (Signed)
Refill done.  

## 2012-03-13 ENCOUNTER — Other Ambulatory Visit: Payer: Self-pay | Admitting: Internal Medicine

## 2012-03-13 NOTE — Telephone Encounter (Signed)
Refill request for Ambien CR 12.5mg  #90 with 2 refills. Last refilled on 9.4.12. OK to refill?

## 2012-03-17 NOTE — Telephone Encounter (Signed)
Needs ov before next RF

## 2012-04-15 ENCOUNTER — Other Ambulatory Visit: Payer: Self-pay | Admitting: Internal Medicine

## 2012-04-16 NOTE — Telephone Encounter (Signed)
OK to refill

## 2012-04-18 ENCOUNTER — Telehealth: Payer: Self-pay | Admitting: *Deleted

## 2012-04-18 MED ORDER — ZOLPIDEM TARTRATE ER 12.5 MG PO TBCR: 12.5000 mg | EXTENDED_RELEASE_TABLET | Freq: Every evening | ORAL | Status: DC | PRN

## 2012-04-18 NOTE — Telephone Encounter (Signed)
Ok #30, no RF 

## 2012-04-18 NOTE — Telephone Encounter (Signed)
Refill done.  

## 2012-04-18 NOTE — Telephone Encounter (Signed)
Refill request for ambien CR 12.5mg  #30 with zero refills. Last filled on 3.28.13. OK to refill?

## 2012-04-18 NOTE — Telephone Encounter (Signed)
Yes

## 2012-05-16 ENCOUNTER — Encounter: Payer: Self-pay | Admitting: Internal Medicine

## 2012-05-16 ENCOUNTER — Ambulatory Visit (INDEPENDENT_AMBULATORY_CARE_PROVIDER_SITE_OTHER): Payer: 59 | Admitting: Internal Medicine

## 2012-05-16 VITALS — BP 126/82 | HR 79 | Temp 98.1°F | Ht 71.0 in | Wt 222.0 lb

## 2012-05-16 DIAGNOSIS — G4700 Insomnia, unspecified: Secondary | ICD-10-CM

## 2012-05-16 DIAGNOSIS — I10 Essential (primary) hypertension: Secondary | ICD-10-CM

## 2012-05-16 DIAGNOSIS — F3289 Other specified depressive episodes: Secondary | ICD-10-CM

## 2012-05-16 DIAGNOSIS — K219 Gastro-esophageal reflux disease without esophagitis: Secondary | ICD-10-CM

## 2012-05-16 DIAGNOSIS — F329 Major depressive disorder, single episode, unspecified: Secondary | ICD-10-CM

## 2012-05-16 DIAGNOSIS — E119 Type 2 diabetes mellitus without complications: Secondary | ICD-10-CM

## 2012-05-16 DIAGNOSIS — Z0000 Encounter for general adult medical examination without abnormal findings: Secondary | ICD-10-CM

## 2012-05-16 LAB — CBC WITH DIFFERENTIAL/PLATELET
Basophils Absolute: 0 10*3/uL (ref 0.0–0.1)
Basophils Relative: 0.5 % (ref 0.0–3.0)
Hemoglobin: 14.7 g/dL (ref 13.0–17.0)
Lymphocytes Relative: 32.4 % (ref 12.0–46.0)
Monocytes Relative: 7.1 % (ref 3.0–12.0)
Neutro Abs: 3.9 10*3/uL (ref 1.4–7.7)
RBC: 4.88 Mil/uL (ref 4.22–5.81)
RDW: 13.4 % (ref 11.5–14.6)
WBC: 7 10*3/uL (ref 4.5–10.5)

## 2012-05-16 LAB — LIPID PANEL
Total CHOL/HDL Ratio: 4
VLDL: 51.6 mg/dL — ABNORMAL HIGH (ref 0.0–40.0)

## 2012-05-16 LAB — COMPREHENSIVE METABOLIC PANEL
ALT: 37 U/L (ref 0–53)
AST: 29 U/L (ref 0–37)
Alkaline Phosphatase: 87 U/L (ref 39–117)
Sodium: 140 mEq/L (ref 135–145)
Total Bilirubin: 0.8 mg/dL (ref 0.3–1.2)
Total Protein: 6.8 g/dL (ref 6.0–8.3)

## 2012-05-16 LAB — LDL CHOLESTEROL, DIRECT: Direct LDL: 55.4 mg/dL

## 2012-05-16 MED ORDER — METFORMIN HCL ER 500 MG PO TB24: 250.0000 mg | ORAL_TABLET | Freq: Two times a day (BID) | ORAL | Status: DC

## 2012-05-16 NOTE — Assessment & Plan Note (Signed)
Patient's family is reluctant for him to take Januvia good pancreatic cancer. He request an alternative. In the past metformin caused some diarrhea. Plan: Discontinue Januvia Very low dose of metformin, gradually titrate up. Consider Amaryl

## 2012-05-16 NOTE — Assessment & Plan Note (Addendum)
Doing great, off Lexapro since 10/2011. Stress has decrease tremendously after she switched the type of work he does

## 2012-05-16 NOTE — Patient Instructions (Signed)
Stopped Januvia Metformin 500 mg, half tablet once a day for one week, then one tablet twice a day. Call if side effects Come back in 3 months Zegerid only as needed for acid reflux

## 2012-05-16 NOTE — Progress Notes (Signed)
  Subjective:    Patient ID: Daniel [name], male    DOB: 1966-05-09, 46 y.o.   MRN: [id number]  HPI Complete physical exam We also discussed the following issues: Diabetes, blood sugars within normal when checked;  he desires to stop Januvia, see assessment and plan. Asthma, well-controlled, uses ventolin less than one time a week. Depression, doing great, he changed the type of work he is doing and feels better. Is not requiring Lexapro or pain medication. High cholesterol good medication compliance Hypertension good medication compliance, ambulatory BPs normal. GERD, symptoms under excellent control with Zegerid for the last year.  Past Medical History  Diagnosis Date  . Hypertension   . Hyperlipidemia   . Asthma   . Depression   . Insomnia   . Polyarthralgia 2009    blood work (-) CKs slightly elevated, bone san (-) saw rheumatology; continue w/ somptoms after holding zocor  . GERD (gastroesophageal reflux disease)   . Diabetes mellitus      Review of Systems No chest pain, shortness of breath No nausea, vomiting or diarrhea. No blood in the stools. No dysuria, gross hematuria or difficulty urinating.     Objective:   Physical Exam  General -- alert, well-developed, and well-nourished.   Neck --no thyromegaly  Lungs -- normal respiratory effort, no intercostal retractions, no accessory muscle use, and normal breath sounds.   Heart-- normal rate, regular rhythm, no murmur, and no gallop.   Abdomen--soft, non-tender, no distention, no masses, no HSM, no guarding, and no rigidity.   Extremities-- no pretibial edema bilaterally Rectal-- + external hemorrhoid noted. Normal sphincter tone. No rectal masses or tenderness. no stool found Prostate:  Prostate gland firm and smooth, no enlargement, nodularity, tenderness, mass, asymmetry or induration. DIABETIC FEET EXAM: No lower extremity edema Normal pedal pulses bilaterally Skin and nails are normal without  calluses Pinprick examination of the feet normal. Neurologic-- alert & oriented X3 and strength normal in all extremities. Psych-- Cognition and judgment appear intact. Alert and cooperative with normal attention span and concentration.  not anxious appearing and not depressed appearing.       Assessment & Plan:

## 2012-05-16 NOTE — Assessment & Plan Note (Signed)
Well-controlled with Ambien when necessary 

## 2012-05-16 NOTE — Assessment & Plan Note (Signed)
Well controlled 

## 2012-05-16 NOTE — Assessment & Plan Note (Addendum)
Excellent control with Zegerid, recommend to switch zegrid to prn

## 2012-05-16 NOTE — Assessment & Plan Note (Addendum)
Td 2006 never had a  Cscope, no family history colon cancer  PSAs every two years until age 46,  DRE wnl today, check a  PSA   sees dentist routinely Encouraged to continue with a healthy lifestyle

## 2012-05-18 ENCOUNTER — Other Ambulatory Visit: Payer: Self-pay | Admitting: Internal Medicine

## 2012-05-18 ENCOUNTER — Encounter: Payer: Self-pay | Admitting: Internal Medicine

## 2012-05-19 NOTE — Telephone Encounter (Signed)
Refill done.  

## 2012-05-19 NOTE — Telephone Encounter (Signed)
Ok to refill? #30 with zero refills. Last filled on 5.3.13.

## 2012-06-09 ENCOUNTER — Other Ambulatory Visit: Payer: Self-pay | Admitting: Internal Medicine

## 2012-06-10 NOTE — Telephone Encounter (Signed)
Refill done.  

## 2012-06-30 ENCOUNTER — Encounter: Payer: Self-pay | Admitting: Internal Medicine

## 2012-06-30 ENCOUNTER — Ambulatory Visit (INDEPENDENT_AMBULATORY_CARE_PROVIDER_SITE_OTHER): Payer: 59 | Admitting: Internal Medicine

## 2012-06-30 ENCOUNTER — Telehealth: Payer: Self-pay | Admitting: *Deleted

## 2012-06-30 ENCOUNTER — Encounter: Payer: Self-pay | Admitting: *Deleted

## 2012-06-30 VITALS — BP 130/96 | HR 84 | Temp 97.9°F | Wt 224.0 lb

## 2012-06-30 DIAGNOSIS — J45909 Unspecified asthma, uncomplicated: Secondary | ICD-10-CM

## 2012-06-30 MED ORDER — AMOXICILLIN 500 MG PO CAPS: 1000.0000 mg | ORAL_CAPSULE | Freq: Two times a day (BID) | ORAL | Status: AC

## 2012-06-30 MED ORDER — PREDNISONE 10 MG PO TABS: ORAL_TABLET | ORAL | Status: DC

## 2012-06-30 MED ORDER — ALBUTEROL SULFATE (2.5 MG/3ML) 0.083% IN NEBU: 2.5000 mg | INHALATION_SOLUTION | Freq: Once | RESPIRATORY_TRACT | Status: DC

## 2012-06-30 NOTE — Telephone Encounter (Signed)
Pt made aware orders have been faxed to [facility] along with Rx so med will be deliver to him sometime today.

## 2012-06-30 NOTE — Telephone Encounter (Signed)
Please note phone note and advise

## 2012-06-30 NOTE — Telephone Encounter (Signed)
Pt noted he was just in for OV and noted when he was at the pharmacy that his solution for nebulizer, pt no longer at pharmacy states he will go back when tomorrow if someone will call in the RX,

## 2012-06-30 NOTE — Progress Notes (Signed)
  Subjective:    Patient ID: Daniel [name], male    DOB: 10-Oct-1966, 46 y.o.   MRN: [id number]  HPI Acute visit Symptoms started 2 and half days ago with sinus congestion, small amount of yellow discharge with some blood. Today he got worse, having increased wheezing and shortness of breath. Has used his albuterol several times however it helps only for about 30 minutes.   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 No chest pain.     Objective:   Physical Exam General -- alert, well-developed,audible wheezing, VSS , o2sat 94, speaking in complete sentences.  HEENT -- TMs normal, throat w/o redness, face symmetric and slt  tender to palpation @ maxillary sinuses, nose not congested   Lungs --  normal respiratory rate at around 10, diffuse wheezing bilaterally, no rhonchi.   Heart-- normal rate, regular rhythm, no murmur, and no gallop.   Extremities-- no pretibial edema bilaterally  Neurologic-- alert & oriented X3 and strength normal in all extremities. Psych-- Cognition and judgment appear intact. Alert and cooperative with normal attention span and concentration.  not anxious appearing and not depressed appearing.      Assessment & Plan:

## 2012-06-30 NOTE — Patient Instructions (Addendum)
Rest, fluids , tylenol For cough, take Mucinex DM twice a day as needed  For wheezing use a nebulizer or the albuterol inhaler every 4-6 hours. Use the nebulizer if symptoms severe. If you feel like using the medication every for hours is not helping urine off, let me know. Take prednisone as prescribed for few days, it may increase the blood sugars, check at least daily, as long as the sugar is below 200 there is no need to worry.  Take the antibiotic as prescribed  (Amoxicillin) Call if no better in few days Call anytime if the symptoms are severe

## 2012-06-30 NOTE — Assessment & Plan Note (Addendum)
Presents  with asthma exacerbation that started as a sinus infection 2.5 days ago. He has exacerbation about once a year,  does not have a nebulizer at home. Got a nebulization here, he sounds and feels better. Plan: See instructions, will provide him with his own nebulizer so he can treat exacerbations more aggressively.

## 2012-08-06 ENCOUNTER — Other Ambulatory Visit: Payer: Self-pay | Admitting: Internal Medicine

## 2012-08-07 NOTE — Telephone Encounter (Signed)
Refill done.  

## 2012-08-14 ENCOUNTER — Other Ambulatory Visit: Payer: Self-pay | Admitting: Internal Medicine

## 2012-08-15 NOTE — Telephone Encounter (Signed)
Refill done.  

## 2012-08-20 ENCOUNTER — Ambulatory Visit (INDEPENDENT_AMBULATORY_CARE_PROVIDER_SITE_OTHER): Payer: 59 | Admitting: Internal Medicine

## 2012-08-20 ENCOUNTER — Encounter: Payer: Self-pay | Admitting: *Deleted

## 2012-08-20 ENCOUNTER — Encounter: Payer: Self-pay | Admitting: Internal Medicine

## 2012-08-20 VITALS — BP 118/82 | HR 86 | Temp 98.3°F | Wt 222.0 lb

## 2012-08-20 DIAGNOSIS — I10 Essential (primary) hypertension: Secondary | ICD-10-CM

## 2012-08-20 DIAGNOSIS — E119 Type 2 diabetes mellitus without complications: Secondary | ICD-10-CM

## 2012-08-20 LAB — ALT: ALT: 39 U/L (ref 0–53)

## 2012-08-20 LAB — HEMOGLOBIN A1C: Hgb A1c MFr Bld: 7.4 % — ABNORMAL HIGH (ref 4.6–6.5)

## 2012-08-20 LAB — AST: AST: 27 U/L (ref 0–37)

## 2012-08-20 NOTE — Assessment & Plan Note (Signed)
Seems well-controlled, no change 

## 2012-08-20 NOTE — Assessment & Plan Note (Signed)
Good compliance with low dose of metformin, CBG seems to be doing well. Plan: Check A1c. If not at goal, consider increase slowly metformin dose to prevent side effects such as diarrhea.

## 2012-08-20 NOTE — Progress Notes (Signed)
  Subjective:    Patient ID: Daniel [name], male    DOB: July 19, 1966, 46 y.o.   MRN: [id number]  HPI Routine office visit, here for a diabetes checkup. Good medication compliance, fasting CBGs around 110, postprandial CBGs are 180.  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     Objective:   Physical Exam General -- alert, well-developed, and well-nourished.   Lungs -- normal respiratory effort, no intercostal retractions, no accessory muscle use, and normal breath sounds.   Heart-- normal rate, regular rhythm, no murmur, and no gallop.   Neurologic-- alert & oriented X3 and strength normal in all extremities. Psych-- Cognition and judgment appear intact. Alert and cooperative with normal attention span and concentration.  not anxious appearing and not depressed appearing.       Assessment & Plan:

## 2012-08-20 NOTE — Patient Instructions (Addendum)
Next office visit in 4-5 months

## 2012-08-25 ENCOUNTER — Other Ambulatory Visit: Payer: Self-pay | Admitting: *Deleted

## 2012-08-25 MED ORDER — GLIMEPIRIDE 1 MG PO TABS: 1.0000 mg | ORAL_TABLET | Freq: Every day | ORAL | Status: DC

## 2012-08-31 ENCOUNTER — Other Ambulatory Visit: Payer: Self-pay | Admitting: Internal Medicine

## 2012-09-01 NOTE — Telephone Encounter (Signed)
Refill done.  

## 2012-09-21 ENCOUNTER — Other Ambulatory Visit: Payer: Self-pay | Admitting: Internal Medicine

## 2012-09-22 NOTE — Telephone Encounter (Signed)
Refill done.  

## 2012-09-23 ENCOUNTER — Other Ambulatory Visit: Payer: Self-pay

## 2012-09-23 MED ORDER — METFORMIN HCL ER 500 MG PO TB24: 1000.0000 mg | ORAL_TABLET | Freq: Every day | ORAL | Status: DC

## 2012-09-23 NOTE — Telephone Encounter (Signed)
Refill done.  

## 2012-09-23 NOTE — Telephone Encounter (Signed)
Pt states you increased his metformin due to lab results and pt needs a new Rx.  Plz advise    MW

## 2012-10-06 ENCOUNTER — Ambulatory Visit (INDEPENDENT_AMBULATORY_CARE_PROVIDER_SITE_OTHER): Payer: 59 | Admitting: Internal Medicine

## 2012-10-06 DIAGNOSIS — I10 Essential (primary) hypertension: Secondary | ICD-10-CM

## 2012-10-07 ENCOUNTER — Telehealth: Payer: Self-pay

## 2012-10-07 NOTE — Telephone Encounter (Signed)
Called pharmacy explained that per Daniel [name] pt was to increase Metformin to 2 tab per day pt need 60 tab instead of 30 for a 30 day supply. Pharmacy agreed to fix. Called pt to make him aware.       MW

## 2012-10-07 NOTE — Telephone Encounter (Signed)
Pt called in concerning metformin Rx suppose to be 500 mg but the pharmacy is saying the tablets they have is 650 mg #30. I checked the Rx and [name] sent Rx correctly for 500 mg #60. Advised pt I would call pharmacy to try and straighten this out.

## 2012-10-08 NOTE — Progress Notes (Signed)
  Subjective:    Patient ID: Daniel [name], male    DOB: 1966-09-04, 46 y.o.   MRN: [id number]  HPI Appointment cancelled    Review of Systems     Objective:   Physical Exam        Assessment & Plan:

## 2012-11-04 ENCOUNTER — Other Ambulatory Visit: Payer: Self-pay | Admitting: Internal Medicine

## 2012-11-05 NOTE — Telephone Encounter (Signed)
Refill done.  

## 2012-11-13 ENCOUNTER — Other Ambulatory Visit: Payer: Self-pay | Admitting: Internal Medicine

## 2012-11-14 NOTE — Telephone Encounter (Signed)
done

## 2012-11-14 NOTE — Telephone Encounter (Signed)
Ok to refill 

## 2013-01-20 ENCOUNTER — Encounter: Payer: Self-pay | Admitting: Internal Medicine

## 2013-01-20 ENCOUNTER — Ambulatory Visit (INDEPENDENT_AMBULATORY_CARE_PROVIDER_SITE_OTHER): Payer: 59 | Admitting: Internal Medicine

## 2013-01-20 VITALS — BP 132/84 | HR 84 | Temp 98.2°F | Wt 230.0 lb

## 2013-01-20 DIAGNOSIS — E119 Type 2 diabetes mellitus without complications: Secondary | ICD-10-CM

## 2013-01-20 DIAGNOSIS — I10 Essential (primary) hypertension: Secondary | ICD-10-CM

## 2013-01-20 DIAGNOSIS — E785 Hyperlipidemia, unspecified: Secondary | ICD-10-CM

## 2013-01-20 NOTE — Assessment & Plan Note (Addendum)
Apparently doing well meds. Eye exam recommended.   Labs

## 2013-01-20 NOTE — Assessment & Plan Note (Signed)
Controlled , check AST, ALT

## 2013-01-20 NOTE — Progress Notes (Signed)
  Subjective:    Patient ID: Daniel [name], male    DOB: [birth year], 47 y.o.   MRN: [id number]  HPI Routine office visit. In general doing well. Diabetes, good medication compliance, ambulatory blood sugars are slightly higher in the last 2 weeks because he hasn't been able to exercise as much. Nevertheless, blood sugars are always less than 120. High cholesterol, good medication compliance. Hypertension, good medication compliance, ambulatory BPs usually 120/ 80-90s   Past Medical History  Diagnosis Date  . Hypertension   . Hyperlipidemia   . Asthma   . Depression   . Insomnia   . Polyarthralgia 2009    blood work (-) CKs slightly elevated, bone san (-) saw rheumatology; continue w/ somptoms after holding zocor  . GERD (gastroesophageal reflux disease)   . Diabetes mellitus    Past Surgical History  Procedure Date  . Hand surgery   . Nose surgery   . Hemorrhoid surgery   . Knee surgery 2011     Right- meniscal tear     Review of Systems Denies nausea, vomiting, diarrhea. GERD symptoms are well-controlled with  PPIs prn. No visual disturbances. No chest pain or shortness of breath. No lower extremity edema.     Objective:   Physical Exam General -- alert, well-developed, and well-nourished.   Lungs -- normal respiratory effort, no intercostal retractions, no accessory muscle use, and normal breath sounds.   Heart-- normal rate, regular rhythm, no murmur, and no gallop.   Extremities-- no pretibial edema bilaterally Neurologic-- alert & oriented X3 and strength normal in all extremities. Psych-- Cognition and judgment appear intact. Alert and cooperative with normal attention span and concentration.  not anxious appearing and not depressed appearing.        Assessment & Plan:

## 2013-01-20 NOTE — Patient Instructions (Addendum)
Please see the eye doctor regularly to rule out diabetes in your eyes.

## 2013-01-20 NOTE — Assessment & Plan Note (Signed)
Good medication compliance, ambulatory DBP occasional in the 90s. No change for now, see instructions. Check a BMP

## 2013-01-21 ENCOUNTER — Encounter: Payer: Self-pay | Admitting: Internal Medicine

## 2013-01-21 LAB — HEMOGLOBIN A1C: Hgb A1c MFr Bld: 5.6 % (ref 4.6–6.5)

## 2013-01-21 LAB — BASIC METABOLIC PANEL
CO2: 27 mEq/L (ref 19–32)
Calcium: 9.3 mg/dL (ref 8.4–10.5)
Glucose, Bld: 105 mg/dL — ABNORMAL HIGH (ref 70–99)
Sodium: 138 mEq/L (ref 135–145)

## 2013-01-21 LAB — ALT: ALT: 44 U/L (ref 0–53)

## 2013-01-31 ENCOUNTER — Other Ambulatory Visit: Payer: Self-pay

## 2013-02-01 ENCOUNTER — Other Ambulatory Visit: Payer: Self-pay | Admitting: Internal Medicine

## 2013-02-02 NOTE — Telephone Encounter (Signed)
Refill done.  

## 2013-02-10 ENCOUNTER — Other Ambulatory Visit: Payer: Self-pay | Admitting: Internal Medicine

## 2013-02-11 NOTE — Telephone Encounter (Signed)
Refill done.  

## 2013-03-12 ENCOUNTER — Other Ambulatory Visit: Payer: Self-pay | Admitting: Internal Medicine

## 2013-03-12 NOTE — Telephone Encounter (Signed)
Refill done.  

## 2013-03-22 ENCOUNTER — Other Ambulatory Visit: Payer: Self-pay | Admitting: Internal Medicine

## 2013-03-23 NOTE — Telephone Encounter (Signed)
Refill done.  

## 2013-05-11 ENCOUNTER — Telehealth: Payer: Self-pay | Admitting: Internal Medicine

## 2013-05-12 NOTE — Telephone Encounter (Signed)
done

## 2013-05-12 NOTE — Telephone Encounter (Signed)
Ok to refill? Last OV 2.4.14 Last filled 11.28.13

## 2013-05-19 ENCOUNTER — Encounter: Payer: Self-pay | Admitting: Lab

## 2013-05-20 ENCOUNTER — Ambulatory Visit (INDEPENDENT_AMBULATORY_CARE_PROVIDER_SITE_OTHER): Payer: 59 | Admitting: Internal Medicine

## 2013-05-20 ENCOUNTER — Encounter: Payer: Self-pay | Admitting: Internal Medicine

## 2013-05-20 VITALS — BP 120/84 | HR 71 | Temp 98.2°F | Ht 71.0 in | Wt 226.0 lb

## 2013-05-20 DIAGNOSIS — J45909 Unspecified asthma, uncomplicated: Secondary | ICD-10-CM

## 2013-05-20 DIAGNOSIS — E119 Type 2 diabetes mellitus without complications: Secondary | ICD-10-CM

## 2013-05-20 DIAGNOSIS — K219 Gastro-esophageal reflux disease without esophagitis: Secondary | ICD-10-CM

## 2013-05-20 DIAGNOSIS — Z0000 Encounter for general adult medical examination without abnormal findings: Secondary | ICD-10-CM

## 2013-05-20 LAB — HEMOGLOBIN A1C: Hgb A1c MFr Bld: 5.1 % (ref 4.6–6.5)

## 2013-05-20 LAB — TSH: TSH: 0.87 u[IU]/mL (ref 0.35–5.50)

## 2013-05-20 LAB — CBC WITH DIFFERENTIAL/PLATELET
Eosinophils Absolute: 0.3 10*3/uL (ref 0.0–0.7)
HCT: 45.8 % (ref 39.0–52.0)
Hemoglobin: 15.7 g/dL (ref 13.0–17.0)
Lymphs Abs: 2.5 10*3/uL (ref 0.7–4.0)
MCHC: 34.3 g/dL (ref 30.0–36.0)
Monocytes Relative: 6.7 % (ref 3.0–12.0)
Neutro Abs: 3.3 10*3/uL (ref 1.4–7.7)
RDW: 13.1 % (ref 11.5–14.6)

## 2013-05-20 LAB — BASIC METABOLIC PANEL
BUN: 11 mg/dL (ref 6–23)
GFR: 96.54 mL/min (ref >60.00)
Glucose, Bld: 86 mg/dL (ref 70–99)
Potassium: 4 mEq/L (ref 3.5–5.1)

## 2013-05-20 LAB — LIPID PANEL
LDL Cholesterol: 63 mg/dL (ref 0–99)
Total CHOL/HDL Ratio: 4
VLDL: 35.2 mg/dL (ref 0.0–40.0)

## 2013-05-20 NOTE — Assessment & Plan Note (Addendum)
Reports is due for eye exam, states he will call and make an appointment Feet exam neg, care discussed  Labs

## 2013-05-20 NOTE — Assessment & Plan Note (Signed)
Hardly ever uses ventolin

## 2013-05-20 NOTE — Progress Notes (Signed)
  Subjective:    Patient ID: Daniel [name], male    DOB: 1966/08/31, 47 y.o.   MRN: [id number]  HPI CPX  Past Medical History  Diagnosis Date  . Hypertension   . Hyperlipidemia   . Asthma   . Depression     h/o  . Insomnia   . Polyarthralgia 2009    blood work (-) CKs slightly elevated, bone san (-) saw rheumatology; continue w/ somptoms after holding zocor  . GERD (gastroesophageal reflux disease)   . Diabetes mellitus    Past Surgical History  Procedure Laterality Date  . Hand surgery    . Nose surgery    . Hemorrhoid surgery    . Knee surgery  2011     Right- meniscal tear   History   Social History  . Marital Status: Married    Spouse Name: N/A    Number of Children: 1  . Years of Education: N/A   Occupational History  . Location manager at [employer]   . retired Librarian, academic    Social History Main Topics  . Smoking status: Passive Smoke Exposure - Never Smoker  . 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  . Alcohol Use: Yes     Comment: socially  . Drug Use: No  . Sexually Active: Not on file   Other Topics Concern  . Not on file   Social History Narrative   Married, 1 adopted child             Family History  Problem Relation Age of Onset  . Diabetes Mother     ??  . Diabetes Father   . Hypertension Mother   . Prostate cancer Neg Hx   . Colon cancer Neg Hx   . Coronary artery disease Neg Hx   . Stroke Neg Hx       Review of Systems Diet is mostly okay, room for improvement. Exercise, he remains very active at work, walks 4 miles 3-4 times a week, goes to the gym sometimes. No chest pain or shortness or breath. No episodes of low blood sugars, no blurred vision, no tingling or burning of his toes. Vision wnl. No nausea, vomiting, diarrhea blood in the stools. No dysuria gross hematuria. No anxiety or depression.     Objective:   Physical Exam BP 120/84  Pulse 71   Temp(Src) 98.2 F (36.8 C) (Oral)  Ht 5\' 11"  (1.803 m)  Wt 226 lb (102.513 kg)  BMI 31.53 kg/m2  SpO2 97%  General -- alert, well-developed, nad   Neck --no thyromegaly  Lungs -- normal respiratory effort, no intercostal retractions, no accessory muscle use, and normal breath sounds.   Heart-- normal rate, regular rhythm, no murmur, and no gallop.   Abdomen--soft, non-tender, no distention, no masses, no HSM, no guarding, and no rigidity.   DIABETIC FEET EXAM: No lower extremity edema Normal pedal pulses bilaterally Skin : 2 mm wart at L heel ; nails are normal without calluses Pinprick examination of the feet normal. Neurologic-- alert & oriented X3 and strength normal in all extremities. Psych-- Cognition and judgment appear intact. Alert and cooperative with normal attention span and concentration.  not anxious appearing and not depressed appearing.       Assessment & Plan:   Heel wart-- will try OTCs first

## 2013-05-20 NOTE — Patient Instructions (Signed)

## 2013-05-20 NOTE — Assessment & Plan Note (Signed)
Hardly ever has sx, good med compliance

## 2013-05-20 NOTE — Assessment & Plan Note (Addendum)
Td 2006 never had a  Cscope, no family history colon cancer  PSAs every two years until age 47, PSA-DRE next year sees dentist routinely EKG nsr Encouraged to continue with a healthy lifestyle  Chronic medical issues seem well controlled  Labs

## 2013-05-21 ENCOUNTER — Other Ambulatory Visit: Payer: Self-pay | Admitting: Internal Medicine

## 2013-06-02 ENCOUNTER — Other Ambulatory Visit: Payer: Self-pay | Admitting: Internal Medicine

## 2013-06-03 NOTE — Telephone Encounter (Signed)
Refill done.  

## 2013-06-08 ENCOUNTER — Encounter: Payer: Self-pay | Admitting: Internal Medicine

## 2013-08-01 ENCOUNTER — Other Ambulatory Visit: Payer: Self-pay | Admitting: Internal Medicine

## 2013-08-03 NOTE — Telephone Encounter (Signed)
Refill done per protocol.  

## 2013-08-18 ENCOUNTER — Other Ambulatory Visit: Payer: Self-pay | Admitting: Internal Medicine

## 2013-08-19 NOTE — Telephone Encounter (Signed)
Med filled.  

## 2013-08-30 ENCOUNTER — Other Ambulatory Visit: Payer: Self-pay | Admitting: Internal Medicine

## 2013-08-31 NOTE — Telephone Encounter (Signed)
rx request- Ambien  Last OV- 05/20/13 Las refilled- 05/11/13 #30 / 3 rf  Moderate UDS contract- 05/20/13 Please advise. DJR

## 2013-09-01 ENCOUNTER — Telehealth: Payer: Self-pay | Admitting: *Deleted

## 2013-09-01 MED ORDER — ZOLPIDEM TARTRATE ER 12.5 MG PO TBCR: EXTENDED_RELEASE_TABLET | ORAL | Status: DC

## 2013-09-01 NOTE — Telephone Encounter (Signed)
Rx refill request for Zolpidem 12.5 mg Last ov-05/20/13 Last date filled 05/11/13 #30  3 R Last UDS 05/20/13 Moderate risk Patient has contract  Please Advise  Ag cma

## 2013-09-01 NOTE — Telephone Encounter (Signed)
Rx has been printed and will be waiting for signature. I have attempted to contact patient to tell that he needs a repeat UDS however had to leave a voice.  If patient calls please advise medication is ready for pick and needs UDS.    Ag cma

## 2013-09-01 NOTE — Telephone Encounter (Signed)
Needs to repeat UDS Ok RF #30, 1

## 2013-09-02 ENCOUNTER — Other Ambulatory Visit: Payer: Self-pay | Admitting: *Deleted

## 2013-09-02 NOTE — Telephone Encounter (Signed)
Patient returned my call and I explained that he has a prescription for a  refill waiting at the front desk, however he would have to give [place] some urine.  Patient stated that he did not need any pills at the moment he still had at least a weeks worth left.  I told him that his prescription would still be available.      Ag cma

## 2013-09-14 ENCOUNTER — Other Ambulatory Visit: Payer: Self-pay | Admitting: Internal Medicine

## 2013-09-15 NOTE — Telephone Encounter (Signed)
rx refilled per protocol. DJR  

## 2013-10-06 ENCOUNTER — Other Ambulatory Visit: Payer: Self-pay | Admitting: Internal Medicine

## 2013-10-07 NOTE — Telephone Encounter (Signed)
rx refilled per protocol. DJR  

## 2013-10-22 ENCOUNTER — Other Ambulatory Visit: Payer: Self-pay

## 2013-11-05 ENCOUNTER — Other Ambulatory Visit: Payer: Self-pay | Admitting: Internal Medicine

## 2013-11-06 ENCOUNTER — Telehealth: Payer: Self-pay | Admitting: *Deleted

## 2013-11-06 MED ORDER — ZOLPIDEM TARTRATE ER 12.5 MG PO TBCR: EXTENDED_RELEASE_TABLET | ORAL | Status: DC

## 2013-11-06 NOTE — Telephone Encounter (Signed)
zolpidem (AMBIEN CR) 12.5 MG CR tablet Last OV: 05/20/13 Last refill: 09/01/2013, #30, 1 refill

## 2013-11-06 NOTE — Telephone Encounter (Signed)
Rx printed, 30 and 1 Rf

## 2013-11-09 ENCOUNTER — Ambulatory Visit (INDEPENDENT_AMBULATORY_CARE_PROVIDER_SITE_OTHER): Payer: 59 | Admitting: Internal Medicine

## 2013-11-09 ENCOUNTER — Encounter: Payer: Self-pay | Admitting: Internal Medicine

## 2013-11-09 VITALS — BP 127/85 | HR 93 | Temp 98.1°F | Wt 228.0 lb

## 2013-11-09 DIAGNOSIS — J45909 Unspecified asthma, uncomplicated: Secondary | ICD-10-CM

## 2013-11-09 MED ORDER — BUDESONIDE-FORMOTEROL FUMARATE 80-4.5 MCG/ACT IN AERO: 2.0000 | INHALATION_SPRAY | Freq: Two times a day (BID) | RESPIRATORY_TRACT | Status: DC

## 2013-11-09 MED ORDER — DOXYCYCLINE HYCLATE 100 MG PO TABS: 100.0000 mg | ORAL_TABLET | Freq: Two times a day (BID) | ORAL | Status: DC

## 2013-11-09 NOTE — Patient Instructions (Signed)
For cough, take Mucinex DM twice a day as needed  Start symbicort 2 puffs twice a day every day You still can use albuterol as needed Take the antibiotic as prescribed  (doxy) Call if no better in few days. Call anytime if the symptoms are severe.  Get your blood work before you leave  Next visit in 2-3 months  for a asthma follow up. Please make an appointment

## 2013-11-09 NOTE — Assessment & Plan Note (Signed)
Symptoms consistent with asthma exacerbation after a URI 4 weeks ago, previously he was not using albuterol but rarely. See instructions. He has noted some nighttime cough, if not better in the next few days he will call for a prescription, hydrocodone syrup may help at least symptomatically

## 2013-11-09 NOTE — Progress Notes (Signed)
Pre visit review using our clinic review tool, if applicable. No additional management support is needed unless otherwise documented below in the visit note. 

## 2013-11-09 NOTE — Progress Notes (Signed)
  Subjective:    Patient ID: Daniel [name], male    DOB: 1966-07-26, 47 y.o.   MRN: [id number]  HPI Acute visit Symptoms started 4 weeks ago, cough, wheezing. Went to a UC, was prescribed a Z-Pak but he's still coughing and wheezing. He is using his inhaler daily x 3 times and albuterol nebulization almost daily.  Past Medical History  Diagnosis Date  . Hypertension   . Hyperlipidemia   . Asthma   . Depression     h/o  . Insomnia   . Polyarthralgia 2009    blood work (-) CKs slightly elevated, bone san (-) saw rheumatology; continue w/ somptoms after holding zocor  . GERD (gastroesophageal reflux disease)   . Diabetes mellitus    Past Surgical History  Procedure Laterality Date  . Hand surgery    . Nose surgery    . Hemorrhoid surgery    . Knee surgery  2011     Right- meniscal tear     Review of Systems No fever or chills No chest pain or shortness or breath He cough sometimes small amounts of thick sputum without hemoptysis. Mild frontal headaches but no sinus discharge. GERD symptoms  not an issue at this time    Objective:   Physical Exam BP 127/85  Pulse 93  Temp(Src) 98.1 F (36.7 C)  Wt 228 lb (103.42 kg)  SpO2 96% General -- alert, well-developed, NAD.  HEENT-- Not pale. TMs normal, throat symmetric, no redness or discharge. Face symmetric, sinuses not tender to palpation. Nose not congested.  Lungs -- normal respiratory effort, no intercostal retractions, no accessory muscle use, and normal breath sounds.  Heart-- normal rate, regular rhythm, no murmur.  Neurologic--  alert & oriented X3. Speech normal, gait normal, strength normal in all extremities.  Psych-- Cognition and judgment appear intact. Cooperative with normal attention span and concentration. No anxious appearing , no depressed appearing.      Assessment & Plan:

## 2013-12-07 LAB — HM DIABETES EYE EXAM

## 2014-01-04 ENCOUNTER — Other Ambulatory Visit: Payer: Self-pay | Admitting: Internal Medicine

## 2014-01-05 ENCOUNTER — Other Ambulatory Visit: Payer: Self-pay | Admitting: *Deleted

## 2014-01-05 ENCOUNTER — Telehealth: Payer: Self-pay | Admitting: *Deleted

## 2014-01-05 MED ORDER — ZOLPIDEM TARTRATE ER 12.5 MG PO TBCR: EXTENDED_RELEASE_TABLET | ORAL | Status: DC

## 2014-01-05 NOTE — Telephone Encounter (Signed)
We are not doing UDS in patients is taking only Ambien. Okay to refill. #30 and 3 RF

## 2014-01-05 NOTE — Telephone Encounter (Signed)
Medication refilled. Faxed to pharmacy. JG//CMA

## 2014-01-05 NOTE — Telephone Encounter (Signed)
zolpidem (AMBIEN CR) 12.5 MG CR tablet Last refill: 11/06/13 #30, 1 refill Last OV: 11/09/13 Moderate risk, contract on file

## 2014-01-05 NOTE — Telephone Encounter (Signed)
Request for Ambien last seen 11/09/13 and filled 11/06/13 #30 with 1 refill.  UDS 05/20/13 Moderate risk. Neg Ambien   Please advise     KP

## 2014-01-07 ENCOUNTER — Encounter: Payer: Self-pay | Admitting: Internal Medicine

## 2014-01-29 ENCOUNTER — Other Ambulatory Visit: Payer: Self-pay | Admitting: Internal Medicine

## 2014-02-07 ENCOUNTER — Other Ambulatory Visit: Payer: Self-pay | Admitting: Internal Medicine

## 2014-02-07 DIAGNOSIS — E119 Type 2 diabetes mellitus without complications: Secondary | ICD-10-CM

## 2014-02-08 NOTE — Telephone Encounter (Signed)
Refill for amaryl sent to [facility]

## 2014-02-16 ENCOUNTER — Encounter: Payer: Self-pay | Admitting: Internal Medicine

## 2014-02-25 ENCOUNTER — Other Ambulatory Visit: Payer: Self-pay | Admitting: Internal Medicine

## 2014-03-04 ENCOUNTER — Other Ambulatory Visit: Payer: Self-pay | Admitting: Internal Medicine

## 2014-03-05 ENCOUNTER — Telehealth: Payer: Self-pay | Admitting: *Deleted

## 2014-03-05 MED ORDER — ZOLPIDEM TARTRATE ER 12.5 MG PO TBCR: EXTENDED_RELEASE_TABLET | ORAL | Status: DC

## 2014-03-05 NOTE — Telephone Encounter (Signed)
Med filled and faxed.  

## 2014-03-05 NOTE — Telephone Encounter (Signed)
Ok for #30, 3 refills 

## 2014-03-05 NOTE — Telephone Encounter (Signed)
rx refill- Ambien 12.5mg   Last OV- 11/29/13 Last refilled- 01/11/13 #30 / 1 rf  UDS- 09/09/13 LOW risk.

## 2014-03-11 ENCOUNTER — Other Ambulatory Visit: Payer: Self-pay | Admitting: Internal Medicine

## 2014-03-25 ENCOUNTER — Other Ambulatory Visit: Payer: Self-pay

## 2014-03-29 ENCOUNTER — Other Ambulatory Visit: Payer: Self-pay | Admitting: Internal Medicine

## 2014-04-08 ENCOUNTER — Other Ambulatory Visit: Payer: Self-pay | Admitting: Internal Medicine

## 2014-05-02 ENCOUNTER — Other Ambulatory Visit: Payer: Self-pay | Admitting: Internal Medicine

## 2014-05-04 ENCOUNTER — Telehealth: Payer: Self-pay | Admitting: Internal Medicine

## 2014-05-04 DIAGNOSIS — E119 Type 2 diabetes mellitus without complications: Secondary | ICD-10-CM

## 2014-05-04 NOTE — Telephone Encounter (Signed)
Caller name: Relation to pt: wife Call back number:[phone]  Pharmacy:[id number], [place]   Reason for call: patient's wife called to check on her husband's refill for metformin because he is completely out of it. Please advise.

## 2014-05-05 NOTE — Telephone Encounter (Signed)
Pt is calling in again wanting to know if we are going to fill his RX for meFormin.  He states the pharmacy has sent over 2 requests as well.

## 2014-05-05 NOTE — Telephone Encounter (Signed)
Left message on voice mail to return our call regarding metformin refill. Patient is due for an office visit to follow up on diabetes.After appt is scheduled, we will refill his medication only up to date of appt.

## 2014-05-06 ENCOUNTER — Other Ambulatory Visit: Payer: Self-pay | Admitting: Internal Medicine

## 2014-05-06 MED ORDER — METFORMIN HCL ER 500 MG PO TB24: ORAL_TABLET | ORAL | Status: DC

## 2014-05-06 NOTE — Telephone Encounter (Signed)
Letter for overdue diabetes health maintence sent.

## 2014-05-26 ENCOUNTER — Other Ambulatory Visit: Payer: Self-pay | Admitting: Internal Medicine

## 2014-06-07 ENCOUNTER — Other Ambulatory Visit: Payer: Self-pay | Admitting: Internal Medicine

## 2014-06-10 ENCOUNTER — Other Ambulatory Visit: Payer: Self-pay | Admitting: Internal Medicine

## 2014-06-28 ENCOUNTER — Other Ambulatory Visit: Payer: Self-pay | Admitting: Family Medicine

## 2014-06-28 ENCOUNTER — Telehealth: Payer: Self-pay | Admitting: *Deleted

## 2014-06-28 MED ORDER — ZOLPIDEM TARTRATE ER 12.5 MG PO TBCR: EXTENDED_RELEASE_TABLET | ORAL | Status: DC

## 2014-06-28 NOTE — Telephone Encounter (Signed)
Done

## 2014-06-28 NOTE — Addendum Note (Signed)
Addended by: Kathlene November E on: 06/28/2014 05:20 PM   Modules accepted: Orders

## 2014-06-28 NOTE — Telephone Encounter (Signed)
rx refill- ambien 12.5mg  Last OV- 11/09/13 Last refilled- 03/05/14 #30 / 3 rf  UDS- 09/09/13 LOW risk

## 2014-06-29 NOTE — Telephone Encounter (Signed)
rx sent to [facility] [address]

## 2014-07-30 ENCOUNTER — Encounter: Payer: Self-pay | Admitting: Internal Medicine

## 2014-07-30 ENCOUNTER — Ambulatory Visit (INDEPENDENT_AMBULATORY_CARE_PROVIDER_SITE_OTHER): Payer: 59 | Admitting: Internal Medicine

## 2014-07-30 ENCOUNTER — Other Ambulatory Visit: Payer: Self-pay | Admitting: Internal Medicine

## 2014-07-30 VITALS — BP 131/77 | HR 68 | Temp 98.4°F | Ht 71.0 in | Wt 229.5 lb

## 2014-07-30 DIAGNOSIS — J45909 Unspecified asthma, uncomplicated: Secondary | ICD-10-CM

## 2014-07-30 DIAGNOSIS — Z0000 Encounter for general adult medical examination without abnormal findings: Secondary | ICD-10-CM

## 2014-07-30 DIAGNOSIS — E119 Type 2 diabetes mellitus without complications: Secondary | ICD-10-CM

## 2014-07-30 DIAGNOSIS — I10 Essential (primary) hypertension: Secondary | ICD-10-CM

## 2014-07-30 DIAGNOSIS — Z23 Encounter for immunization: Secondary | ICD-10-CM

## 2014-07-30 LAB — COMPREHENSIVE METABOLIC PANEL
ALT: 37 U/L (ref 0–53)
AST: 25 U/L (ref 0–37)
Albumin: 4.1 g/dL (ref 3.5–5.2)
Alkaline Phosphatase: 68 U/L (ref 39–117)
BILIRUBIN TOTAL: 1.2 mg/dL (ref 0.2–1.2)
BUN: 15 mg/dL (ref 6–23)
CO2: 26 mEq/L (ref 19–32)
Calcium: 9.8 mg/dL (ref 8.4–10.5)
Chloride: 101 mEq/L (ref 96–112)
Creatinine, Ser: 1 mg/dL (ref 0.4–1.5)
GFR: 97.14 mL/min (ref >60.00)
GLUCOSE: 90 mg/dL (ref 70–99)
Potassium: 4 mEq/L (ref 3.5–5.1)
SODIUM: 134 meq/L — AB (ref 135–145)
Total Protein: 6.5 g/dL (ref 6.0–8.3)

## 2014-07-30 LAB — LIPID PANEL
Cholesterol: 159 mg/dL (ref 0–200)
HDL: 32.8 mg/dL — AB (ref >39.00)
LDL CALC: 91 mg/dL (ref 0–99)
NONHDL: 126.2
Total CHOL/HDL Ratio: 5
Triglycerides: 174 mg/dL — ABNORMAL HIGH (ref 0.0–149.0)
VLDL: 34.8 mg/dL (ref 0.0–40.0)

## 2014-07-30 LAB — CBC WITH DIFFERENTIAL/PLATELET
Basophils Absolute: 0 10*3/uL (ref 0.0–0.1)
Basophils Relative: 0.5 % (ref 0.0–3.0)
EOS PCT: 5.3 % — AB (ref 0.0–5.0)
Eosinophils Absolute: 0.3 10*3/uL (ref 0.0–0.7)
HEMATOCRIT: 44.5 % (ref 39.0–52.0)
Hemoglobin: 15.3 g/dL (ref 13.0–17.0)
Lymphocytes Relative: 40.4 % (ref 12.0–46.0)
Lymphs Abs: 2.5 10*3/uL (ref 0.7–4.0)
MCHC: 34.4 g/dL (ref 30.0–36.0)
MCV: 87 fl (ref 78.0–100.0)
MONOS PCT: 6.1 % (ref 3.0–12.0)
Monocytes Absolute: 0.4 10*3/uL (ref 0.1–1.0)
Neutro Abs: 2.9 10*3/uL (ref 1.4–7.7)
Neutrophils Relative %: 47.7 % (ref 43.0–77.0)
Platelets: 129 10*3/uL — ABNORMAL LOW (ref 150.0–400.0)
RBC: 5.12 Mil/uL (ref 4.22–5.81)
RDW: 13.4 % (ref 11.5–15.5)
WBC: 6.2 10*3/uL (ref 4.0–10.5)

## 2014-07-30 LAB — HEMOGLOBIN A1C: HEMOGLOBIN A1C: 5.3 % (ref 4.6–6.5)

## 2014-07-30 LAB — TSH: TSH: 1.47 u[IU]/mL (ref 0.35–4.50)

## 2014-07-30 LAB — PSA: PSA: 0.46 ng/mL (ref 0.10–4.00)

## 2014-07-30 MED ORDER — BUDESONIDE-FORMOTEROL FUMARATE 80-4.5 MCG/ACT IN AERO: 2.0000 | INHALATION_SPRAY | Freq: Two times a day (BID) | RESPIRATORY_TRACT | Status: DC

## 2014-07-30 MED ORDER — ALBUTEROL SULFATE HFA 108 (90 BASE) MCG/ACT IN AERS: 2.0000 | INHALATION_SPRAY | Freq: Four times a day (QID) | RESPIRATORY_TRACT | Status: DC | PRN

## 2014-07-30 NOTE — Progress Notes (Signed)
Subjective:    Patient ID: Daniel [name], male    DOB: 1966/04/20, 48 y.o.   MRN: [id number]  DOS:  07/30/2014 Type of visit - description: CPX History: Has a spots on the right arm for 3 months, no itching or bleeding Asthma is well controlled, he ran out of Symbicort few days ago and has noted some wheezing. Uses albuterol prior to running which is almost daily.   ROS Denies fever, chills, runny nose or sinus congestion No nausea, vomiting, diarrhea No sputum production or hemoptysis No anxiety or depression No dysuria, gross hematuria or difficulty urinating  Past Medical History  Diagnosis Date  . Hypertension   . Hyperlipidemia   . Asthma   . Depression     h/o  . Insomnia   . Polyarthralgia 2009    blood work (-) CKs slightly elevated, bone san (-) saw rheumatology; continue w/ somptoms after holding zocor  . GERD (gastroesophageal reflux disease)   . Diabetes mellitus     Past Surgical History  Procedure Laterality Date  . Hand surgery    . Nose surgery    . Hemorrhoid surgery    . Knee surgery  2011     Right- meniscal tear    History   Social History  . Marital Status: Married    Spouse Name: N/A    Number of Children: 1  . Years of Education: N/A   Occupational History  . 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 Smoking status: Passive Smoke Exposure - Never Smoker  . 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  . Alcohol Use: Yes     Comment: socially  . Drug Use: No  . Sexual Activity: Not on file   Other Topics Concern  . Not on file   Social History Narrative   Married, 1 adopted child                  Medication List       This list is accurate as of: 07/30/14 11:59 PM.  Always use your most recent med list.               albuterol 108 (90 BASE) MCG/ACT inhaler  Commonly known as:  VENTOLIN HFA  Inhale 2 puffs  into the lungs every 6 (six) hours as needed for wheezing or shortness of breath.     budesonide-formoterol 80-4.5 MCG/ACT inhaler  Commonly known as:  SYMBICORT  Inhale 2 puffs into the lungs 2 (two) times daily.     carvedilol 6.25 MG tablet  Commonly known as:  COREG  take 1 tablet by mouth twice a day     fish oil-omega-3 fatty acids 1000 MG capsule  Take 1 g by mouth daily.     FLAX SEED OIL PO  Take 1 each by mouth at bedtime.     glimepiride 1 MG tablet  Commonly known as:  AMARYL  take 1 tablet by mouth once daily before BREAKFAST     losartan 100 MG tablet  Commonly known as:  COZAAR  take 1 tablet by mouth once daily (NEED APPOINTMENT)     metFORMIN 500 MG 24 hr tablet  Commonly known as:  GLUCOPHAGE-XR  Take 2 tablets by mouth twice a day with food     multivitamin,tx-minerals tablet  Take 1 tablet by mouth daily.  omeprazole-sodium bicarbonate 40-1100 MG per capsule  Commonly known as:  ZEGERID  take 1 capsule by mouth once daily ON AN EMPTY STOMACH--- prn     ONE TOUCH ULTRA TEST test strip  Generic drug:  glucose blood  TEST three times a day     ONETOUCH DELICA LANCETS 43X Misc  CHECK BLOOD SUGAR 3 TIMES A DAY.     simvastatin 20 MG tablet  Commonly known as:  ZOCOR  take 1 tablet by mouth once daily     zolpidem 12.5 MG CR tablet  Commonly known as:  AMBIEN CR  take 1 tablet by mouth at bedtime if needed for sleep           Objective:   Physical Exam  Skin:      BP 131/77  Pulse 68  Temp(Src) 98.4 F (36.9 C) (Oral)  Ht 5\' 11"  (1.803 m)  Wt 229 lb 8 oz (104.101 kg)  BMI 32.02 kg/m2  SpO2 98%  General -- alert, well-developed, NAD.  Neck --no thyromegaly   HEENT-- Not pale.  Lungs -- normal respiratory effort, no intercostal retractions, no accessory muscle use, and normal breath sounds.  Heart-- normal rate, regular rhythm, no murmur.  Abdomen-- Not distended, good bowel sounds,soft, non-tender. Rectal-- No external  abnormalities noted. Normal sphincter tone. No rectal masses or tenderness. Stool brown, Hemoccult negative  Prostate--Prostate gland firm and smooth, no enlargement, nodularity, tenderness, mass, asymmetry or induration. Extremities-- no pretibial edema bilaterally  Neurologic--  alert & oriented X3. Speech normal, gait appropriate for age, strength symmetric and appropriate for age.   Psych-- Cognition and judgment appear intact. Cooperative with normal attention span and concentration. No anxious or depressed appearing.         Assessment & Plan:

## 2014-07-30 NOTE — Progress Notes (Signed)
Pre-visit discussion using our clinic review tool. No additional management support is needed unless otherwise documented below in the visit note.  

## 2014-07-30 NOTE — Patient Instructions (Addendum)
Get your blood work before you leave   Next visit is for routine check up regards your blood sugar , blood pressure in 4 months  No need to come back fasting Please make an appointment     Molluscum Contagiosum Molluscum contagiosum is a viral infection of the skin that causes smooth surfaced, firm, small (3 to 5 mm), dome-shaped bumps (papules) which are flesh-colored. The bumps usually do not hurt or itch. In children, they most often appear on the face, trunk, arms and legs. In adults, the growths are commonly found on the genitals, thighs, face, neck, and belly (abdomen). The infection may be spread to others by close (skin to skin) contact (such as occurs in schools and swimming pools), sharing towels and clothing, and through sexual contact. The bumps usually disappear without treatment in 2 to 4 months, especially in children. You may have them treated to avoid spreading them. Scraping (curetting) the middle part (central plug) of the bump with a needle or sharp curette, or application of liquid nitrogen for 8 or 9 seconds usually cures the infection. HOME CARE INSTRUCTIONS   Do not scratch the bumps. This may spread the infection to other parts of the body and to other people.  Avoid close contact with others, including sexual contact, until the bumps disappear. Do not share towels or clothing.  If liquid nitrogen was used, blisters will form. Leave the blisters alone and cover with a bandage. The tops will fall off by themselves in 7 to 14 days.  Four months without a lesion is usually a cure. SEEK IMMEDIATE MEDICAL CARE IF:  You have a fever.  You develop swelling, redness, pain, tenderness, or warmth in the areas of the bumps. They may be infected. Document Released: 11/30/2000 Document Revised: 02/25/2012 Document Reviewed: 05/13/2009 [facility] Patient Information 2015 [place], [place]. This information is not intended to replace advice given to you by your health care provider.  Make sure you discuss any questions you have with your health care provider.

## 2014-07-30 NOTE — Assessment & Plan Note (Addendum)
[name] 2006 never had a  Cscope, no family history colon cancer, options discussed, elected a cscope ---refer to GI check a PSA-DRE normal today Encouraged to continue with a healthy lifestyle  Chronic medical issues  Diabetes, good compliance of medication,  sugars between 90 and 120; last a1c ~ 5, was rec to d/c gimeperide but still taking, denies low sugar sx Ambulatory BPs normal. Also has a lesion consistent with molluscus contg. recommend observation, call if changes-bleeds  Labs

## 2014-08-03 ENCOUNTER — Telehealth: Payer: Self-pay

## 2014-08-03 NOTE — Telephone Encounter (Signed)
Spoke with Pt about lab results and instructions from Dr. Larose Kells.

## 2014-08-03 NOTE — Telephone Encounter (Signed)
Caller name:Quame Relation to pt: Call back [place]:  Reason for call: Returned call

## 2014-08-05 ENCOUNTER — Encounter: Payer: Self-pay | Admitting: Internal Medicine

## 2014-09-05 ENCOUNTER — Other Ambulatory Visit: Payer: Self-pay | Admitting: Internal Medicine

## 2014-09-23 ENCOUNTER — Ambulatory Visit (AMBULATORY_SURGERY_CENTER): Payer: Self-pay | Admitting: *Deleted

## 2014-09-23 VITALS — Ht 71.0 in | Wt 231.6 lb

## 2014-09-23 DIAGNOSIS — Z1211 Encounter for screening for malignant neoplasm of colon: Secondary | ICD-10-CM

## 2014-09-23 MED ORDER — MOVIPREP 100 G PO SOLR: ORAL | Status: DC

## 2014-09-23 NOTE — Progress Notes (Signed)
No allergies to eggs or soy. No problems with anesthesia.  Pt given Emmi instructions for colonoscopy  No oxygen use  No diet drug use  

## 2014-10-04 ENCOUNTER — Other Ambulatory Visit: Payer: Self-pay | Admitting: Internal Medicine

## 2014-10-06 ENCOUNTER — Ambulatory Visit (AMBULATORY_SURGERY_CENTER): Payer: 59 | Admitting: Internal Medicine

## 2014-10-06 ENCOUNTER — Encounter: Payer: Self-pay | Admitting: Internal Medicine

## 2014-10-06 VITALS — BP 137/92 | HR 59 | Temp 96.2°F | Resp 16 | Ht 71.0 in | Wt 231.0 lb

## 2014-10-06 DIAGNOSIS — D128 Benign neoplasm of rectum: Secondary | ICD-10-CM

## 2014-10-06 DIAGNOSIS — K621 Rectal polyp: Secondary | ICD-10-CM

## 2014-10-06 DIAGNOSIS — D129 Benign neoplasm of anus and anal canal: Secondary | ICD-10-CM

## 2014-10-06 DIAGNOSIS — Z1211 Encounter for screening for malignant neoplasm of colon: Secondary | ICD-10-CM

## 2014-10-06 LAB — GLUCOSE, CAPILLARY
GLUCOSE-CAPILLARY: 136 mg/dL — AB (ref 70–99)
Glucose-Capillary: 110 mg/dL — ABNORMAL HIGH (ref 70–99)

## 2014-10-06 MED ORDER — SODIUM CHLORIDE 0.9 % IV SOLN: 500.0000 mL | INTRAVENOUS | Status: DC

## 2014-10-06 NOTE — Op Note (Signed)
[facility]  [address] [place], [address]   COLONOSCOPY PROCEDURE REPORT  PATIENT: Daniel [name], Daniel [name]  MR#: [id number] BIRTHDATE: July 01, 1966 , 48  yrs. old GENDER: male ENDOSCOPIST: Jerene Bears, MD REFERRED IH:[id number] Larose Kells, M.D. PROCEDURE DATE:  10/06/2014 PROCEDURE:   Colonoscopy with snare polypectomy First Screening Colonoscopy - Avg.  risk and is 50 yrs.  old or older Yes.  Prior Negative Screening - Now for repeat screening. N/A  History of Adenoma - Now for follow-up colonoscopy & has been > or = to 3 yrs.  N/A  Polyps Removed Today? Yes. ASA CLASS:   Class II INDICATIONS:average risk for colorectal cancer and first colonoscopy. MEDICATIONS: Monitored anesthesia care and Propofol 300 mg IV, lidocaine 40 mg IV  DESCRIPTION OF PROCEDURE:   After the risks benefits and alternatives of the procedure were thoroughly explained, informed consent was obtained.  The digital rectal exam revealed no rectal mass.   The LB [id number]  endoscope was introduced through the anus and advanced to the cecum, which was identified by both the appendix and ileocecal valve. No adverse events experienced. The quality of the prep was good, using MoviPrep  The instrument was then slowly withdrawn as the colon was fully examined.  COLON FINDINGS: A sessile polyp measuring 4 mm in size was found in the rectum and sigmoid colon.  A polypectomy was performed with a cold snare.  The resection was complete, the polyp tissue was completely retrieved and sent to histology.   There was mild diverticulosis noted in the descending colon and sigmoid colon. Retroflexed views revealed no abnormalities. The time to cecum=2 minutes 42 seconds.  Withdrawal time=9 minutes 15 seconds.  The scope was withdrawn and the procedure completed. COMPLICATIONS: There were no immediate complications.  ENDOSCOPIC IMPRESSION: 1.   Sessile polyp was found in the rectum and sigmoid colon; polypectomy was  performed with a cold snare 2.   Mild diverticulosis was noted in the descending colon and sigmoid colon  RECOMMENDATIONS: 1.  Await pathology results 2.  High fiber diet 3.  If the polyp removed today is proven to be an adenomatous (pre-cancerous) polyp, you will need a repeat colonoscopy in 5 years.  Otherwise you should continue to follow colorectal cancer screening guidelines for "routine risk" patients with colonoscopy in 10 years.  You will receive a letter within 1-2 weeks with the results of your biopsy as well as final recommendations.  Please call my office if you have not received a letter after 3 weeks.  eSigned:  Jerene Bears, MD 10/06/2014 8:25 AM   cc: The Patient and Kathlene November, MD

## 2014-10-06 NOTE — Progress Notes (Signed)
Called to room to assist during endoscopic procedure.  Patient ID and intended procedure confirmed with present staff. Received instructions for my participation in the procedure from the performing physician.  

## 2014-10-06 NOTE — Progress Notes (Signed)
Pt stable to RR 

## 2014-10-06 NOTE — Patient Instructions (Signed)
YOU HAD AN ENDOSCOPIC PROCEDURE TODAY AT THE [facility]: Refer to the procedure report that was given to you for any specific questions about what was found during the examination.  If the procedure report does not answer your questions, please call your gastroenterologist to clarify.  If you requested that your care partner not be given the details of your procedure findings, then the procedure report has been included in a sealed envelope for you to review at your convenience later.  YOU SHOULD EXPECT: Some feelings of bloating in the abdomen. Passage of more gas than usual.  Walking can help get rid of the air that was put into your GI tract during the procedure and reduce the bloating. If you had a lower endoscopy (such as a colonoscopy or flexible sigmoidoscopy) you may notice spotting of blood in your stool or on the toilet paper. If you underwent a bowel prep for your procedure, then you may not have a normal bowel movement for a few days.  DIET: Your first meal following the procedure should be a light meal and then it is ok to progress to your normal diet.  A half-sandwich or bowl of soup is an example of a good first meal.  Heavy or fried foods are harder to digest and may make you feel nauseous or bloated.  Likewise meals heavy in dairy and vegetables can cause extra gas to form and this can also increase the bloating.  Drink plenty of fluids but you should avoid alcoholic beverages for 24 hours.  ACTIVITY: Your care partner should take you home directly after the procedure.  You should plan to take it easy, moving slowly for the rest of the day.  You can resume normal activity the day after the procedure however you should NOT DRIVE or use heavy machinery for 24 hours (because of the sedation medicines used during the test).    SYMPTOMS TO REPORT IMMEDIATELY: A gastroenterologist can be reached at any hour.  During normal business hours, 8:30 AM to 5:00 PM Monday through Friday,  call [phone].  After hours and on weekends, please call the GI answering service at [phone] who will take a message and have the physician on call contact you.   Following lower endoscopy (colonoscopy or flexible sigmoidoscopy):  Excessive amounts of blood in the stool  Significant tenderness or worsening of abdominal pains  Swelling of the abdomen that is new, acute  Fever of 100F or higher    FOLLOW UP: If any biopsies were taken you will be contacted by phone or by letter within the next 1-3 weeks.  Call your gastroenterologist if you have not heard about the biopsies in 3 weeks.  Our staff will call the home number listed on your records the next business day following your procedure to check on you and address any questions or concerns that you may have at that time regarding the information given to you following your procedure. This is a courtesy call and so if there is no answer at the home number and we have not heard from you through the emergency physician on call, we will assume that you have returned to your regular daily activities without incident.  SIGNATURES/CONFIDENTIALITY: You and/or your care partner have signed paperwork which will be entered into your electronic medical record.  These signatures attest to the fact that that the information above on your After Visit Summary has been reviewed and is understood.  Full responsibility of the confidentiality   of this discharge information lies with you and/or your care-partner.  Polyp, diverticulosis, and high fiber diet information given. 

## 2014-10-07 ENCOUNTER — Telehealth: Payer: Self-pay | Admitting: *Deleted

## 2014-10-07 NOTE — Telephone Encounter (Signed)
  Follow up Call-  Call back number 10/06/2014  Post procedure Call Back phone  # [phone]  Permission to leave phone message Yes     Patient questions:  Do you have a fever, pain , or abdominal swelling? No. Pain Score  0 *  Have you tolerated food without any problems? Yes.    Have you been able to return to your normal activities? Yes.    Do you have any questions about your discharge instructions: Diet   No. Medications  No. Follow up visit  No.  Do you have questions or concerns about your Care? No.  Actions: * If pain score is 4 or above: No action needed, pain <4.  Pt. Stated he is at work.

## 2014-10-12 ENCOUNTER — Encounter: Payer: Self-pay | Admitting: Internal Medicine

## 2014-10-26 ENCOUNTER — Other Ambulatory Visit: Payer: Self-pay | Admitting: Internal Medicine

## 2014-10-26 ENCOUNTER — Telehealth: Payer: Self-pay | Admitting: Family Medicine

## 2014-10-27 NOTE — Telephone Encounter (Signed)
done

## 2014-10-27 NOTE — Telephone Encounter (Signed)
Dr. [name] 

## 2014-10-27 NOTE — Telephone Encounter (Signed)
Faxed to [facility] pharmacy

## 2014-10-27 NOTE — Telephone Encounter (Signed)
Pt is requesting refill on Zolpidem  Last OV: 07/30/2014  Last Fill: 06/28/2014 # [phone] RF UDS: 09/09/2013 Low risk  Please advise.

## 2014-11-15 ENCOUNTER — Other Ambulatory Visit: Payer: Self-pay

## 2014-12-02 ENCOUNTER — Ambulatory Visit (INDEPENDENT_AMBULATORY_CARE_PROVIDER_SITE_OTHER): Payer: 59 | Admitting: Internal Medicine

## 2014-12-02 ENCOUNTER — Encounter: Payer: Self-pay | Admitting: Internal Medicine

## 2014-12-02 VITALS — BP 137/95 | HR 73 | Temp 98.1°F | Wt 230.0 lb

## 2014-12-02 DIAGNOSIS — I10 Essential (primary) hypertension: Secondary | ICD-10-CM

## 2014-12-02 DIAGNOSIS — L989 Disorder of the skin and subcutaneous tissue, unspecified: Secondary | ICD-10-CM

## 2014-12-02 DIAGNOSIS — E119 Type 2 diabetes mellitus without complications: Secondary | ICD-10-CM

## 2014-12-02 LAB — HEMOGLOBIN A1C: Hgb A1c MFr Bld: 6.4 % (ref 4.6–6.5)

## 2014-12-02 NOTE — Progress Notes (Signed)
Pre visit review using our clinic review tool, if applicable. No additional management support is needed unless otherwise documented below in the visit note. 

## 2014-12-02 NOTE — Assessment & Plan Note (Signed)
Ambulatory BP consistently in the 120/80, continue losartan and Coreg, last BMP satisfactory

## 2014-12-02 NOTE — Assessment & Plan Note (Signed)
Well-controlled per last A1c, continue with metformin, recommend to work on diet and exercise, he has not been doing as well as before lately

## 2014-12-02 NOTE — Patient Instructions (Signed)
Get your blood work before you leave   Refer you to a skin doctor, if you don't  hear from [place] in 2 weeks please call us    Please come back to the office in 4-5 months  for a routine check up

## 2014-12-02 NOTE — Progress Notes (Signed)
Subjective:    Patient ID: Daniel [name], male    DOB: [birth year], 48 y.o.   MRN: [id number]  DOS:  12/02/2014 Type of visit - description : rov Interval history: In general feeling well, good compliance with medications Diet and exercise have not been the best, blood sugars may be running a little high. Ambulatory BPs within normal Still has a skin lesion on the right arm. He tried to "self freeze" the lesion at work.   ROS No chest pain or difficulty breathing No nausea, vomiting, diarrhea. No lower extremity paresthesias  Past Medical History  Diagnosis Date  . Hypertension   . Hyperlipidemia   . Asthma   . Depression     h/o  . Insomnia   . Polyarthralgia 2009    blood work (-) CKs slightly elevated, bone san (-) saw rheumatology; continue w/ somptoms after holding zocor  . GERD (gastroesophageal reflux disease)   . Diabetes mellitus     Past Surgical History  Procedure Laterality Date  . Hand surgery Left 2006  . Nasal fracture surgery  2006  . Hemorrhoid surgery    . Knee surgery Right 2011     History   Social History  . Marital Status: Married    Spouse Name: N/A    Number of Children: 1  . Years of Education: N/A   Occupational History  . 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 Smoking status: Passive Smoke Exposure - Never Smoker  . 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  . Alcohol Use: 3.6 oz/week    6 Cans of beer per week  . Drug Use: No  . Sexual Activity: Not on file   Other Topics Concern  . Not on file   Social History Narrative   Married, 1 adopted child                  Medication List       This list is accurate as of: 12/02/14 10:23 PM.  Always use your most recent med list.               albuterol 108 (90 BASE) MCG/ACT inhaler  Commonly known as:  VENTOLIN HFA  Inhale 2 puffs into the lungs every 6  (six) hours as needed for wheezing or shortness of breath.     budesonide-formoterol 80-4.5 MCG/ACT inhaler  Commonly known as:  SYMBICORT  Inhale 2 puffs into the lungs 2 (two) times daily.     carvedilol 6.25 MG tablet  Commonly known as:  COREG  take 1 tablet by mouth twice a day     fish oil-omega-3 fatty acids 1000 MG capsule  Take 1 g by mouth daily.     FLAX SEED OIL PO  Take 1 each by mouth at bedtime.     losartan 100 MG tablet  Commonly known as:  COZAAR  Take 1 tablet daily     metFORMIN 500 MG 24 hr tablet  Commonly known as:  GLUCOPHAGE-XR  Take 2 tablets by mouth twice a day with food     multivitamin,tx-minerals tablet  Take 1 tablet by mouth daily.     omeprazole-sodium bicarbonate 40-1100 MG per capsule  Commonly known as:  ZEGERID  take 1 capsule by mouth once daily ON A EMPTY STOMACH     ONE TOUCH ULTRA TEST  test strip  Generic drug:  glucose blood  TEST three times a day     ONETOUCH DELICA LANCETS 34P Misc  CHECK BLOOD SUGAR 3 TIMES A DAY.     simvastatin 20 MG tablet  Commonly known as:  ZOCOR  take 1 tablet by mouth once daily     zolpidem 12.5 MG CR tablet  Commonly known as:  AMBIEN CR  take 1 tablet by mouth at bedtime if needed for sleep           Objective:   Physical Exam  Skin:      BP 137/95 mmHg  Pulse 73  Temp(Src) 98.1 F (36.7 C) (Oral)  Wt 230 lb (104.327 kg)  SpO2 97% General -- alert, well-developed, NAD.  Lungs -- normal respiratory effort, no intercostal retractions, no accessory muscle use, and normal breath sounds.  Heart-- normal rate, regular rhythm, no murmur.  DIABETIC FEET EXAM: No lower extremity edema Normal pedal pulses bilaterally Skin normal, nails normal, no calluses Pinprick examination of the feet normal. Psych-- Cognition and judgment appear intact. Cooperative with normal attention span and concentration. No anxious or depressed appearing.        Assessment & Plan:   Skin lesion for a  few months, refer to dermatology

## 2014-12-03 ENCOUNTER — Encounter: Payer: Self-pay | Admitting: Internal Medicine

## 2014-12-25 ENCOUNTER — Other Ambulatory Visit: Payer: Self-pay | Admitting: Internal Medicine

## 2014-12-28 ENCOUNTER — Other Ambulatory Visit: Payer: Self-pay | Admitting: Internal Medicine

## 2015-01-23 ENCOUNTER — Other Ambulatory Visit: Payer: Self-pay | Admitting: Internal Medicine

## 2015-02-27 ENCOUNTER — Other Ambulatory Visit: Payer: Self-pay | Admitting: Internal Medicine

## 2015-03-03 ENCOUNTER — Other Ambulatory Visit: Payer: Self-pay | Admitting: Internal Medicine

## 2015-04-17 ENCOUNTER — Other Ambulatory Visit: Payer: Self-pay | Admitting: Family Medicine

## 2015-04-18 ENCOUNTER — Other Ambulatory Visit: Payer: Self-pay

## 2015-04-18 NOTE — Telephone Encounter (Signed)
Rx printed, awaiting MD signature.  

## 2015-04-18 NOTE — Telephone Encounter (Signed)
[name] 

## 2015-04-18 NOTE — Telephone Encounter (Signed)
Ok 30 and 5 

## 2015-04-18 NOTE — Telephone Encounter (Signed)
Pt is requesting refill on Ambien.  Last OV: 12/02/2014 Last Fill: 10/27/2014 # [phone] 5RF  Please advise.

## 2015-04-18 NOTE — Telephone Encounter (Signed)
Rx faxed to [facility] pharmacy.  

## 2015-05-03 ENCOUNTER — Encounter: Payer: Self-pay | Admitting: Internal Medicine

## 2015-05-03 ENCOUNTER — Ambulatory Visit (INDEPENDENT_AMBULATORY_CARE_PROVIDER_SITE_OTHER): Payer: 59 | Admitting: Internal Medicine

## 2015-05-03 VITALS — BP 128/76 | HR 77 | Temp 98.2°F | Ht 71.0 in | Wt 216.5 lb

## 2015-05-03 DIAGNOSIS — I10 Essential (primary) hypertension: Secondary | ICD-10-CM

## 2015-05-03 DIAGNOSIS — J4520 Mild intermittent asthma, uncomplicated: Secondary | ICD-10-CM | POA: Diagnosis not present

## 2015-05-03 DIAGNOSIS — R197 Diarrhea, unspecified: Secondary | ICD-10-CM | POA: Diagnosis not present

## 2015-05-03 DIAGNOSIS — E119 Type 2 diabetes mellitus without complications: Secondary | ICD-10-CM

## 2015-05-03 LAB — CBC WITH DIFFERENTIAL/PLATELET
Basophils Absolute: 0 10*3/uL (ref 0.0–0.1)
Basophils Relative: 0.5 % (ref 0.0–3.0)
Eosinophils Absolute: 0.4 10*3/uL (ref 0.0–0.7)
Eosinophils Relative: 5.2 % — ABNORMAL HIGH (ref 0.0–5.0)
HEMATOCRIT: 45.3 % (ref 39.0–52.0)
HEMOGLOBIN: 16 g/dL (ref 13.0–17.0)
LYMPHS PCT: 44.8 % (ref 12.0–46.0)
Lymphs Abs: 3.3 10*3/uL (ref 0.7–4.0)
MCHC: 35.3 g/dL (ref 30.0–36.0)
MCV: 85 fl (ref 78.0–100.0)
MONOS PCT: 5.2 % (ref 3.0–12.0)
Monocytes Absolute: 0.4 10*3/uL (ref 0.1–1.0)
NEUTROS ABS: 3.3 10*3/uL (ref 1.4–7.7)
Neutrophils Relative %: 44.3 % (ref 43.0–77.0)
Platelets: 169 10*3/uL (ref 150.0–400.0)
RBC: 5.32 Mil/uL (ref 4.22–5.81)
RDW: 13.5 % (ref 11.5–15.5)
WBC: 7.4 10*3/uL (ref 4.0–10.5)

## 2015-05-03 LAB — BASIC METABOLIC PANEL
BUN: 14 mg/dL (ref 6–23)
CO2: 25 mEq/L (ref 19–32)
CREATININE: 1.13 mg/dL (ref 0.40–1.50)
Calcium: 9.6 mg/dL (ref 8.4–10.5)
Chloride: 103 mEq/L (ref 96–112)
GFR: 88 mL/min (ref >60.00)
Glucose, Bld: 108 mg/dL — ABNORMAL HIGH (ref 70–99)
Potassium: 3.9 mEq/L (ref 3.5–5.1)
Sodium: 136 mEq/L (ref 135–145)

## 2015-05-03 LAB — HEMOGLOBIN A1C: HEMOGLOBIN A1C: 5.2 % (ref 4.6–6.5)

## 2015-05-03 MED ORDER — PREDNISONE 10 MG PO TABS: 10.0000 mg | ORAL_TABLET | Freq: Every day | ORAL | Status: DC

## 2015-05-03 MED ORDER — SIMVASTATIN 20 MG PO TABS: 20.0000 mg | ORAL_TABLET | Freq: Every day | ORAL | Status: DC

## 2015-05-03 MED ORDER — CARVEDILOL 6.25 MG PO TABS: 6.2500 mg | ORAL_TABLET | Freq: Two times a day (BID) | ORAL | Status: DC

## 2015-05-03 MED ORDER — METFORMIN HCL ER 500 MG PO TB24: 1000.0000 mg | ORAL_TABLET | Freq: Every day | ORAL | Status: DC

## 2015-05-03 MED ORDER — LOSARTAN POTASSIUM 100 MG PO TABS: 100.0000 mg | ORAL_TABLET | Freq: Every day | ORAL | Status: DC

## 2015-05-03 MED ORDER — HYDROCODONE-HOMATROPINE 5-1.5 MG/5ML PO SYRP: 5.0000 mL | ORAL_SOLUTION | Freq: Every evening | ORAL | Status: DC | PRN

## 2015-05-03 NOTE — Progress Notes (Signed)
Subjective:    Patient ID: Daniel [name], male    DOB: [birth year], 48 y.o.   MRN: [id number]  DOS:  05/03/2015 Type of visit - description : rov Interval history: 2 weeks history of cough, mostly at night, no sputum production, feels like "something is caught I throat". Unable to sleep. Also, 10 days history of diarrhea, watery, about 6 episodes a day. No sick contacts however somebody at work brought ham from [place] before the symptoms started. Diabetes, good compliance with metformin, ambulatory CBGs around 110 High cholesterol, ran out of medicines few weeks ago, needs a refill    Review of Systems No fever chills No chest pain or difficulty breathing No nausea, vomiting, blood in the stools or actual abdominal pain. GERD symptoms well controlled Some wheezing lately.   Past Medical History  Diagnosis Date  . Hypertension   . Hyperlipidemia   . Asthma   . Depression     h/o  . Insomnia   . Polyarthralgia 2009    blood work (-) CKs slightly elevated, bone san (-) saw rheumatology; continue w/ somptoms after holding zocor  . GERD (gastroesophageal reflux disease)   . Diabetes mellitus     Past Surgical History  Procedure Laterality Date  . Hand surgery Left 2006  . Nasal fracture surgery  2006  . Hemorrhoid surgery    . Knee surgery Right 2011     History   Social History  . Marital Status: Married    Spouse Name: N/A  . Number of Children: 1  . Years of Education: N/A   Occupational History  . 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 Smoking status: Passive Smoke Exposure - Never Smoker  . 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  . Alcohol Use: 3.6 oz/week    6 Cans of beer per week  . Drug Use: No  . Sexual Activity: Not on file   Other Topics Concern  . Not on file   Social History Narrative   Married, 1 adopted child                   Medication List       This list is accurate as of: 05/03/15  8:51 PM.  Always use your most recent med list.               albuterol 108 (90 BASE) MCG/ACT inhaler  Commonly known as:  VENTOLIN HFA  Inhale 2 puffs into the lungs every 6 (six) hours as needed for wheezing or shortness of breath.     budesonide-formoterol 80-4.5 MCG/ACT inhaler  Commonly known as:  SYMBICORT  Inhale 2 puffs into the lungs 2 (two) times daily.     carvedilol 6.25 MG tablet  Commonly known as:  COREG  Take 1 tablet (6.25 mg total) by mouth 2 (two) times daily.     fish oil-omega-3 fatty acids 1000 MG capsule  Take 1 g by mouth daily.     FLAX SEED OIL PO  Take 1 each by mouth at bedtime.     HYDROcodone-homatropine 5-1.5 MG/5ML syrup  Commonly known as:  HYCODAN  Take 5 mLs by mouth at bedtime as needed for cough.     losartan 100 MG tablet  Commonly known as:  COZAAR  Take 1 tablet (100 mg total) by mouth daily.  metFORMIN 500 MG 24 hr tablet  Commonly known as:  GLUCOPHAGE-XR  Take 2 tablets (1,000 mg total) by mouth daily with breakfast.     multivitamin,tx-minerals tablet  Take 1 tablet by mouth daily.     omeprazole-sodium bicarbonate 40-1100 MG per capsule  Commonly known as:  ZEGERID  take 1 capsule by mouth once daily ON AN EMPTY STOMACH     ONE TOUCH ULTRA TEST test strip  Generic drug:  glucose blood  TEST three times a day     ONETOUCH DELICA LANCETS 30N Misc  CHECK BLOOD SUGAR three times a day     predniSONE 10 MG tablet  Commonly known as:  DELTASONE  Take 1 tablet (10 mg total) by mouth daily. 2 tabs a day x 5 days     simvastatin 20 MG tablet  Commonly known as:  ZOCOR  Take 1 tablet (20 mg total) by mouth daily.     zolpidem 12.5 MG CR tablet  Commonly known as:  AMBIEN CR  Take 1 tablet (12.5 mg total) by mouth at bedtime.           Objective:   Physical Exam BP 128/76 mmHg  Pulse 77  Temp(Src) 98.2 F (36.8 C) (Oral)  Ht 5\' 11"   (1.803 m)  Wt 216 lb 8 oz (98.204 kg)  BMI 30.21 kg/m2  SpO2 97% General:   Well developed, well nourished . NAD, well appearing.  HEENT:  Normocephalic . Face symmetric, atraumatic; throat symmetric, sinuses not TTP, nose not congested Lungs:  Slightly increased expiratory time? No actual wheezes or crackles Normal respiratory effort, no intercostal retractions, no accessory muscle use. Heart: RRR,  no murmur.  no pretibial edema bilaterally  Abdomen:  Not distended, soft, non-tender. No rebound or rigidity. No mass,organomegaly Skin: Not pale. Not jaundice Neurologic:  alert & oriented X3.  Speech normal, gait appropriate for age and unassisted Psych--  Cognition and judgment appear intact.  Cooperative with normal attention span and concentration.  Behavior appropriate. No anxious or depressed appearing.      Assessment & Plan:    Diabetes,  good compliance with medication, ambulatory CBGs around 110, check A1c, RF medications  Hypertension,  seems well-controlled, check a BMP, refill medicines  High cholesterol,  ran out of medicines, will refill, recheck a FLP and return to the office  History of asthma,  presents with persisting nocturnal cough for 2 weeks. Exam is benign. Atypical infection? Mild asthma exacerbation?. Plan: Prednisone for 5 days, albuterol as needed for cough, If not better will call for antibiotics  Acute diarrhea, Watery diarrhea for 10 days, no fever chills, not toxic appearing, exam of the abdomen benign. Plan conservative treatment, see instructions

## 2015-05-03 NOTE — Patient Instructions (Signed)
Get your blood work before you leave   Cough: Prednisone for 5 days, stop prednisone if your blood sugars are more than 200 Albuterol 2 puffs at bedtime if you're coughing Hydrocodone, if needed for persisting cough, watch for drowsiness Mucinex DM as needed Call if not improving soon   Diarrhea Drink plenty of fluids Take a probiotic OTC daily Pepto-Bismol as needed Call if no better in 1 week      Come back to the office by 07-2015 for a physical exam  Please schedule an appointment at the front desk    Come back fasting       REMINDERS It is extremely important to know what medications you take, please bring all bottles with you (or an accurate medication list) for every visit  If we are ordering labs, XRs or referring you to a specialist : we will always communicate to you the results or the time of the appointment within few days. If you don't hear from [place] please call the office

## 2015-05-03 NOTE — Progress Notes (Signed)
Pre visit review using our clinic review tool, if applicable. No additional management support is needed unless otherwise documented below in the visit note. 

## 2015-06-13 ENCOUNTER — Other Ambulatory Visit: Payer: Self-pay

## 2015-07-03 ENCOUNTER — Encounter: Payer: Self-pay | Admitting: Internal Medicine

## 2015-07-04 ENCOUNTER — Telehealth: Payer: Self-pay | Admitting: Internal Medicine

## 2015-07-04 NOTE — Telephone Encounter (Signed)
LMOM at home number informing him that we received his MyChart message, instructed him to call or send another MyChart message to let us know how he has been taking his Metformin. Instructed him we may need to decrease his dosage due to his excellent A1c at his last check.

## 2015-07-05 MED ORDER — METFORMIN HCL ER 500 MG PO TB24: ORAL_TABLET | ORAL | Status: DC

## 2015-07-05 NOTE — Telephone Encounter (Signed)
Received MyChart message from Pt, he is currently taking Metformin 2 tablets twice daily. MyChart message sent to Dr. Larose Kells for recommendation on how Pt should take or if dosage should be lowered.

## 2015-07-05 NOTE — Telephone Encounter (Signed)
Metformin changed to 2 tablets in the morning and 1 tablet in the afternoon, #90 tablets or #270 for 3 month supply sent to [facility]. Pt informed of medication change via MyChart by Dr. Larose Kells. Med list updated.

## 2015-08-18 DIAGNOSIS — Z8619 Personal history of other infectious and parasitic diseases: Secondary | ICD-10-CM

## 2015-08-18 HISTORY — DX: Personal history of other infectious and parasitic diseases: Z86.19

## 2015-08-30 ENCOUNTER — Other Ambulatory Visit: Payer: Self-pay | Admitting: Internal Medicine

## 2015-09-12 ENCOUNTER — Encounter: Payer: Self-pay | Admitting: Internal Medicine

## 2015-09-12 ENCOUNTER — Ambulatory Visit (INDEPENDENT_AMBULATORY_CARE_PROVIDER_SITE_OTHER): Payer: 59 | Admitting: Internal Medicine

## 2015-09-12 VITALS — BP 118/78 | HR 59 | Temp 97.9°F | Ht 71.0 in | Wt 209.4 lb

## 2015-09-12 DIAGNOSIS — R197 Diarrhea, unspecified: Secondary | ICD-10-CM

## 2015-09-12 DIAGNOSIS — R7989 Other specified abnormal findings of blood chemistry: Secondary | ICD-10-CM

## 2015-09-12 DIAGNOSIS — Z0000 Encounter for general adult medical examination without abnormal findings: Secondary | ICD-10-CM | POA: Diagnosis not present

## 2015-09-12 DIAGNOSIS — E119 Type 2 diabetes mellitus without complications: Secondary | ICD-10-CM

## 2015-09-12 DIAGNOSIS — Z09 Encounter for follow-up examination after completed treatment for conditions other than malignant neoplasm: Secondary | ICD-10-CM | POA: Insufficient documentation

## 2015-09-12 DIAGNOSIS — Z114 Encounter for screening for human immunodeficiency virus [HIV]: Secondary | ICD-10-CM

## 2015-09-12 DIAGNOSIS — Z1159 Encounter for screening for other viral diseases: Secondary | ICD-10-CM

## 2015-09-12 LAB — BASIC METABOLIC PANEL
BUN: 13 mg/dL (ref 6–23)
CO2: 25 mEq/L (ref 19–32)
Calcium: 9.8 mg/dL (ref 8.4–10.5)
Chloride: 103 mEq/L (ref 96–112)
Creatinine, Ser: 0.99 mg/dL (ref 0.40–1.50)
GFR: 102.36 mL/min (ref >60.00)
Glucose, Bld: 91 mg/dL (ref 70–99)
POTASSIUM: 4.1 meq/L (ref 3.5–5.1)
Sodium: 137 mEq/L (ref 135–145)

## 2015-09-12 LAB — HEMOGLOBIN A1C: Hgb A1c MFr Bld: 4.9 % (ref 4.6–6.5)

## 2015-09-12 LAB — LIPID PANEL
CHOL/HDL RATIO: 5
CHOLESTEROL: 159 mg/dL (ref 0–200)
HDL: 33.5 mg/dL — AB (ref >39.00)
NonHDL: 125.46
TRIGLYCERIDES: 252 mg/dL — AB (ref 0.0–149.0)
VLDL: 50.4 mg/dL — ABNORMAL HIGH (ref 0.0–40.0)

## 2015-09-12 LAB — LDL CHOLESTEROL, DIRECT: LDL DIRECT: 90 mg/dL

## 2015-09-12 LAB — AST: AST: 19 U/L (ref 0–37)

## 2015-09-12 LAB — TSH: TSH: 1.9 u[IU]/mL (ref 0.35–4.50)

## 2015-09-12 LAB — ALT: ALT: 37 U/L (ref 0–53)

## 2015-09-12 NOTE — Patient Instructions (Addendum)
Get your blood work before you leave   Get containers from the lab for stool studies: Culture, WBCs, C. Difficile  Stop metformin  Next visit  for a checkup in 3 months      (15 minutes) Please schedule an appointment at the front desk No fasting

## 2015-09-12 NOTE — Progress Notes (Addendum)
Subjective:    Patient ID: Daniel [name], male    DOB: 1966-05-08, 49 y.o.   MRN: [id number]  DOS:  09/12/2015 Type of visit - description :  Interval history:    Review of Systems  Complaining of decreased appetite lately. Also diarrhea, "50% of the time" for the last several months. Stools are sometimes watery. No associated blood in the stools. We reviewed the chart, he used to weight 230 pounds, now is 209. + + Stress at work but no depression per se, he is now working first shift and very odd and longer hours  Constitutional: No fever. No chills.   No unusual sweats  HEENT: No dental problems, no ear discharge, no facial swelling, no voice changes. No eye discharge, no eye  redness , no  intolerance to light   Respiratory: No wheezing , no  difficulty breathing. No cough , no mucus production  Cardiovascular: No CP, no leg swelling , no  Palpitations  GI: no nausea, no vomiting, no  abdominal pain.  No blood in the stools. No dysphagia, no odynophagia    Endocrine: No polyphagia, no polyuria , no polydipsia  GU: No dysuria, gross hematuria, difficulty urinating. No urinary urgency, no frequency.  Musculoskeletal: No joint swellings or unusual aches or pains  Skin: No change in the color of the skin, palor , no  Rash  Allergic, immunologic: No environmental allergies , no  food allergies  Neurological: No dizziness no  syncope. No headaches. No diplopia, no slurred, no slurred speech, no motor deficits, no facial  Numbness  Hematological: No enlarged lymph nodes, no easy bruising , no unusual bleedings  Psychiatry: No suicidal ideas, no hallucinations, no beavior problems, no confusion.     Past Medical History  Diagnosis Date  . Hypertension   . Hyperlipidemia   . Asthma   . Depression     h/o  . Insomnia   . Polyarthralgia 2009    blood work (-) CKs slightly elevated, bone san (-) saw rheumatology; continue w/ somptoms after holding zocor  . GERD  (gastroesophageal reflux disease)   . Diabetes mellitus     Past Surgical History  Procedure Laterality Date  . Hand surgery Left 2006  . Nasal fracture surgery  2006  . Hemorrhoid surgery    . Knee surgery Right 2011     Social History   Social History  . Marital Status: Married    Spouse Name: N/A  . Number of Children: 1  . Years of Education: N/A   Occupational History  . 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 Smoking status: Passive Smoke Exposure - Never Smoker  . 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  . Alcohol Use: 3.6 oz/week    6 Cans of beer per week  . Drug Use: No  . Sexual Activity: Not on file   Other Topics Concern  . Not on file   Social History Narrative   Married, 1 adopted child                  Medication List       This list is accurate as of: 09/12/15 10:11 AM.  Always use your most recent med list.               albuterol 108 (90 BASE) MCG/ACT inhaler  Commonly  known as:  VENTOLIN HFA  Inhale 2 puffs into the lungs every 6 (six) hours as needed for wheezing or shortness of breath.     budesonide-formoterol 80-4.5 MCG/ACT inhaler  Commonly known as:  SYMBICORT  Inhale 2 puffs into the lungs 2 (two) times daily.     carvedilol 6.25 MG tablet  Commonly known as:  COREG  Take 1 tablet (6.25 mg total) by mouth 2 (two) times daily.     fish oil-omega-3 fatty acids 1000 MG capsule  Take 1 g by mouth daily.     FLAX SEED OIL PO  Take 1 each by mouth at bedtime.     HYDROcodone-homatropine 5-1.5 MG/5ML syrup  Commonly known as:  HYCODAN  Take 5 mLs by mouth at bedtime as needed for cough.     losartan 100 MG tablet  Commonly known as:  COZAAR  Take 1 tablet (100 mg total) by mouth daily.     metFORMIN 500 MG 24 hr tablet  Commonly known as:  GLUCOPHAGE-XR  Take 2 tablets in the morning and 1 tablet in the  afternoon.     multivitamin,tx-minerals tablet  Take 1 tablet by mouth daily.     omeprazole-sodium bicarbonate 40-1100 MG per capsule  Commonly known as:  ZEGERID  take 1 capsule by mouth once daily ON AN EMPTY STOMACH     ONE TOUCH ULTRA TEST test strip  Generic drug:  glucose blood  TEST three times a day     ONETOUCH DELICA LANCETS 36U Misc  CHECK BLOOD SUGAR three times a day     predniSONE 10 MG tablet  Commonly known as:  DELTASONE  Take 1 tablet (10 mg total) by mouth daily. 2 tabs a day x 5 days     simvastatin 20 MG tablet  Commonly known as:  ZOCOR  Take 1 tablet (20 mg total) by mouth daily.     zolpidem 12.5 MG CR tablet  Commonly known as:  AMBIEN CR  Take 1 tablet (12.5 mg total) by mouth at bedtime.           Objective:   Physical Exam BP 118/78 mmHg  Pulse 59  Temp(Src) 97.9 F (36.6 C) (Oral)  Ht 5\' 11"  (1.803 m)  Wt 209 lb 6 oz (94.972 kg)  BMI 29.21 kg/m2  SpO2 96% General:   Well developed, well nourished . NAD.  HEENT:  Normocephalic . Face symmetric, atraumatic Neck: No thyromegaly Lungs:  CTA B Normal respiratory effort, no intercostal retractions, no accessory muscle use. Heart: RRR,  no murmur.  no pretibial edema bilaterally  Abdomen:  Not distended, soft, non-tender. No rebound or rigidity. No mass,organomegaly Skin: Not pale. Not jaundice Neurologic:  alert & oriented X3.  Speech normal, gait appropriate for age and unassisted Psych--  Cognition and judgment appear intact.  Cooperative with normal attention span and concentration.  Behavior appropriate. No anxious or depressed appearing.    Assessment & Plan:   Assessment > DM Hyperlipidemia HTN Asthma GERD Insomnia Polyarthralgia: w/u and rheumatology eval neg 2009, stop zocor did not help  Plan: Weight loss, diarrhea: Appetite has definitely decreased, + objective weight loss, has diarrhea on and off. Admits to a lot of stress at work but no depression per se.  Colonoscopy 09-2014 colon polyps, per report no colitis.  Plan: dc metformin, check labs,stool studies, reassess in 3 months.  Diabetes: Last A1c below 6, stop metformin today GERD: On PPIs as needed only.

## 2015-09-12 NOTE — Assessment & Plan Note (Addendum)
Weight loss, diarrhea: Appetite has definitely decreased, + objective weight loss, has diarrhea on and off. Admits to a lot of stress at work but no depression per se. Colonoscopy 09-2014 colon polyps, per report no colitis.  Plan: dc metformin, check labs,stool studies, reassess in 3 months.  Diabetes: Last A1c below 6, stop metformin today GERD: On PPIs as needed only.

## 2015-09-12 NOTE — Progress Notes (Signed)
Pre visit review using our clinic review tool, if applicable. No additional management support is needed unless otherwise documented below in the visit note. 

## 2015-09-12 NOTE — Assessment & Plan Note (Addendum)
Td 2006 had a booster 2015 ? (per pt); pnm shot 2015; Flu shot -- at work Colonoscopy 09-2014, benign polyps, 10 years Prostate cancer screening: DRE normal 2015,  consistently normal PSAs  Labs : BMP, LFT, FLP, A1c, TSH Diet and exercise discussed

## 2015-09-13 LAB — HEPATITIS C ANTIBODY: HCV Ab: NEGATIVE

## 2015-09-13 LAB — HIV ANTIBODY (ROUTINE TESTING W REFLEX): HIV: NONREACTIVE

## 2015-09-13 NOTE — Addendum Note (Signed)
Addended by: Peggyann Shoals on: 09/13/2015 11:03 AM   Modules accepted: Orders

## 2015-09-13 NOTE — Addendum Note (Signed)
Addended by: Colon Branch on: 09/13/2015 09:37 AM   Modules accepted: Miquel Dunn

## 2015-09-14 ENCOUNTER — Telehealth: Payer: Self-pay

## 2015-09-14 LAB — C. DIFFICILE GDH AND TOXIN A/B
C. DIFF TOXIN A/B: NOT DETECTED
C. DIFFICILE GDH: DETECTED — AB

## 2015-09-14 NOTE — Telephone Encounter (Signed)
POSITIVE C. DIFF STUDY. Please advise.

## 2015-09-14 NOTE — Telephone Encounter (Signed)
Waiting for the toxin C. difficile by PCR

## 2015-09-15 LAB — CLOSTRIDIUM DIFFICILE BY PCR: Toxigenic C. Difficile by PCR: DETECTED — CR

## 2015-09-15 LAB — FECAL LACTOFERRIN, QUANT: LACTOFERRIN: NEGATIVE

## 2015-09-15 MED ORDER — METRONIDAZOLE 500 MG PO TABS: 500.0000 mg | ORAL_TABLET | Freq: Three times a day (TID) | ORAL | Status: DC

## 2015-09-15 NOTE — Telephone Encounter (Signed)
Received call from Langdon at [facility] with C. Diff confirmation.

## 2015-09-15 NOTE — Addendum Note (Signed)
Addended by: Wilfrid Lund on: 09/15/2015 01:48 PM   Modules accepted: Orders

## 2015-09-15 NOTE — Telephone Encounter (Signed)
See results, call the patient

## 2015-09-15 NOTE — Telephone Encounter (Signed)
LMOM informing Pt of stool culture results and positive C. Diff result. Informed him he is contagious, needs to take precautions such as Lysol, bleach, washing his hands before food preparations. Informed him to begin Flagyl 500 mg 1 tablet three times daily for 10 days. Rx sent to [facility]. Also recommend that he leave work and not return at least before Monday per Dr. Larose Kells. Instructed Pt to return call if he has any questions or concerns.

## 2015-09-17 LAB — STOOL CULTURE

## 2015-10-19 ENCOUNTER — Other Ambulatory Visit: Payer: Self-pay | Admitting: Family Medicine

## 2015-10-20 NOTE — Telephone Encounter (Signed)
Okay #30 and 5 refills 

## 2015-10-20 NOTE — Telephone Encounter (Signed)
Rx printed, awaiting MD signature.  

## 2015-10-20 NOTE — Telephone Encounter (Signed)
Pt is requesting refill on Ambien.  Last OV: 09/12/2015 Last Fill: 04/18/2015 #30 and 5RF UDS: Not needed   Please advise.

## 2015-10-20 NOTE — Telephone Encounter (Signed)
Rx faxed to [facility] pharmacy.  

## 2015-11-13 ENCOUNTER — Other Ambulatory Visit: Payer: Self-pay | Admitting: Internal Medicine

## 2015-12-07 ENCOUNTER — Encounter: Payer: Self-pay | Admitting: Internal Medicine

## 2015-12-07 ENCOUNTER — Ambulatory Visit (INDEPENDENT_AMBULATORY_CARE_PROVIDER_SITE_OTHER): Payer: Commercial Managed Care - HMO | Admitting: Internal Medicine

## 2015-12-07 VITALS — BP 122/66 | HR 68 | Temp 97.6°F | Ht 71.0 in | Wt 213.1 lb

## 2015-12-07 DIAGNOSIS — R197 Diarrhea, unspecified: Secondary | ICD-10-CM

## 2015-12-07 DIAGNOSIS — A09 Infectious gastroenteritis and colitis, unspecified: Secondary | ICD-10-CM | POA: Diagnosis not present

## 2015-12-07 DIAGNOSIS — E119 Type 2 diabetes mellitus without complications: Secondary | ICD-10-CM

## 2015-12-07 DIAGNOSIS — Z09 Encounter for follow-up examination after completed treatment for conditions other than malignant neoplasm: Secondary | ICD-10-CM

## 2015-12-07 DIAGNOSIS — Z8619 Personal history of other infectious and parasitic diseases: Secondary | ICD-10-CM | POA: Insufficient documentation

## 2015-12-07 LAB — HEMOGLOBIN A1C: Hgb A1c MFr Bld: 5.2 % (ref 4.6–6.5)

## 2015-12-07 MED ORDER — AZELASTINE HCL 0.1 % NA SOLN: 2.0000 | Freq: Every evening | NASAL | Status: DC | PRN

## 2015-12-07 NOTE — Patient Instructions (Signed)
Before you leave the office: Get your blood work at the lab  Schedule a routine office visit or check up to be done in  6 months  No fasting  Front desk:  15     After you leave the office: Rest, fluids , tylenol  For cough:  Take Mucinex DM twice a day as needed until better  For nasal congestion: Use OTC Nasocort or Flonase : 2 nasal sprays on each side of the nose in the morning until you feel better Use ASTELIN a prescribed spray : 2 nasal sprays on each side of the nose at night until you feel better   Avoid decongestants such as  Pseudoephedrine or phenylephrine    Call if not gradually better over the next  10 days  Call anytime if the symptoms are severe

## 2015-12-07 NOTE — Progress Notes (Signed)
Subjective:    Patient ID: Daniel [name], male    DOB: [birth year], 49 y.o.   MRN: [id number]  DOS:  12/07/2015 Type of visit - description : Routine checkup Interval history:  The last visit he complained of weight loss and diarrhea, stools show C. difficile, we couldn't contact the patient, he however reports that got our message and took antibiotics and now is essentially asymptomatic.  Wt Readings from Last 3 Encounters:  12/07/15 213 lb 2 oz (96.673 kg)  09/12/15 209 lb 6 oz (94.972 kg)  05/03/15 216 lb 8 oz (98.204 kg)   Diabetes: We discontinue metformin given diarrhea however there may not have been a  relationship. See above. CBGs are slightly higher than previous months in the 120s.  Developed sinus congestion frontal headache, taking OTCs.    Review of Systems   denies fever chills + Nasal discharge, slightly off: No nausea, vomiting, blood in the stools. Mild cough with minimal sputum production for the last few days    Past Medical History  Diagnosis Date  . Hypertension   . Hyperlipidemia   . Asthma   . Depression     h/o  . Insomnia   . Polyarthralgia 2009    blood work (-) CKs slightly elevated, bone san (-) saw rheumatology; continue w/ somptoms after holding zocor  . GERD (gastroesophageal reflux disease)   . Diabetes mellitus   . H/O Clostridium difficile infection 08/2015    Past Surgical History  Procedure Laterality Date  . Hand surgery Left 2006  . Nasal fracture surgery  2006  . Hemorrhoid surgery    . Knee surgery Right 2011     Social History   Social History  . Marital Status: Married    Spouse Name: N/A  . Number of Children: 1  . Years of Education: N/A   Occupational History  . 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 Smoking status: Passive Smoke Exposure - Never Smoker  . 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   . Alcohol Use: 3.6 oz/week    6 Cans of beer per week  . Drug Use: No  . Sexual Activity: Not on file   Other Topics Concern  . Not on file   Social History Narrative   Married, 1 adopted child                  Medication List       This list is accurate as of: 12/07/15  5:09 PM.  Always use your most recent med list.               albuterol 108 (90 BASE) MCG/ACT inhaler  Commonly known as:  VENTOLIN HFA  Inhale 2 puffs into the lungs every 6 (six) hours as needed for wheezing or shortness of breath.     azelastine 0.1 % nasal spray  Commonly known as:  ASTELIN  Place 2 sprays into both nostrils at bedtime as needed for rhinitis. Use in each nostril as directed     budesonide-formoterol 80-4.5 MCG/ACT inhaler  Commonly known as:  SYMBICORT  Inhale 2 puffs into the lungs 2 (two) times daily.     carvedilol 6.25 MG tablet  Commonly known as:  COREG  Take 1 tablet (6.25 mg total) by mouth 2 (two) times daily.     fish oil-omega-3  fatty acids 1000 MG capsule  Take 1 g by mouth daily.     FLAX SEED OIL PO  Take 1 each by mouth at bedtime.     losartan 100 MG tablet  Commonly known as:  COZAAR  Take 1 tablet (100 mg total) by mouth daily.     multivitamin,tx-minerals tablet  Take 1 tablet by mouth daily.     omeprazole-sodium bicarbonate 40-1100 MG capsule  Commonly known as:  ZEGERID  take 1 capsule by mouth once daily ON AN EMPTY STOMACH     ONE TOUCH ULTRA TEST test strip  Generic drug:  glucose blood  TEST three times a day     ONETOUCH DELICA LANCETS 99991111 Misc  CHECK BLOOD SUGAR three times a day     simvastatin 20 MG tablet  Commonly known as:  ZOCOR  Take 1 tablet (20 mg total) by mouth daily.     zolpidem 12.5 MG CR tablet  Commonly known as:  AMBIEN CR  Take 1 tablet (12.5 mg total) by mouth at bedtime as needed for sleep.           Objective:   Physical Exam BP 122/66 mmHg  Pulse 68  Temp(Src) 97.6 F (36.4 C) (Oral)  Ht 5\' 11"   (1.803 m)  Wt 213 lb 2 oz (96.673 kg)  BMI 29.74 kg/m2  SpO2 98% General:   Well developed, well nourished . NAD.  HEENT:  Normocephalic . Face symmetric, atraumatic. Nose congested, sinuses is slightly TTP at both maxillary areas TMs normal Lungs:  CTA B Normal respiratory effort, no intercostal retractions, no accessory muscle use. Heart: RRR,  no murmur.  No pretibial edema bilaterally  Skin: Not pale. Not jaundice Neurologic:  alert & oriented X3.  Speech normal, gait appropriate for age and unassisted Psych--  Cognition and judgment appear intact.  Cooperative with normal attention span and concentration.  Behavior appropriate. No anxious or depressed appearing.      Assessment & Plan:  Assessment > DM - dc metformin 08-2015, had diarrhea but likely dt cdiff, ok to restart if needed  Hyperlipidemia HTN Asthma GERD Insomnia Polyarthralgia: w/u and rheumatology eval neg 2009, stop zocor did not help Diarrhea, + c diff 08-2015, s/p abx   Plan: Weight loss, diarrhea: Found to have C. difficile, took antibiotics and discontinue metformin. Symptoms resolve. He is gaining weight DM: Now off metformin, check A1c. Restart metformin if necessary URI: Conservative treatment, C instructions, try to avoid antibiotics if possible given h/o  C. difficile. Call if no better in few days.  RTC 6 months

## 2015-12-07 NOTE — Progress Notes (Signed)
Pre visit review using our clinic review tool, if applicable. No additional management support is needed unless otherwise documented below in the visit note. 

## 2015-12-07 NOTE — Assessment & Plan Note (Signed)
Weight loss, diarrhea: Found to have C. difficile, took antibiotics and discontinue metformin. Symptoms resolve. He is gaining weight DM: Now off metformin, check A1c. Restart metformin if necessary URI: Conservative treatment, C instructions, try to avoid antibiotics if possible given h/o  C. difficile. Call if no better in few days.  RTC 6 months

## 2015-12-21 ENCOUNTER — Encounter: Payer: Self-pay | Admitting: Internal Medicine

## 2015-12-22 ENCOUNTER — Ambulatory Visit (HOSPITAL_BASED_OUTPATIENT_CLINIC_OR_DEPARTMENT_OTHER)
Admission: RE | Admit: 2015-12-22 | Discharge: 2015-12-22 | Disposition: A | Discharge status: Home / Self Care | Payer: Commercial Managed Care - HMO | Source: Ambulatory Visit | Attending: Medical | Admitting: Medical

## 2015-12-22 ENCOUNTER — Encounter: Payer: Self-pay | Admitting: Medical

## 2015-12-22 ENCOUNTER — Ambulatory Visit (INDEPENDENT_AMBULATORY_CARE_PROVIDER_SITE_OTHER): Payer: Commercial Managed Care - HMO | Admitting: Medical

## 2015-12-22 VITALS — BP 126/76 | HR 71 | Temp 97.8°F | Ht 71.0 in | Wt 221.4 lb

## 2015-12-22 DIAGNOSIS — J189 Pneumonia, unspecified organism: Secondary | ICD-10-CM | POA: Diagnosis not present

## 2015-12-22 DIAGNOSIS — R918 Other nonspecific abnormal finding of lung field: Secondary | ICD-10-CM | POA: Insufficient documentation

## 2015-12-22 DIAGNOSIS — R062 Wheezing: Secondary | ICD-10-CM | POA: Insufficient documentation

## 2015-12-22 DIAGNOSIS — J4541 Moderate persistent asthma with (acute) exacerbation: Secondary | ICD-10-CM | POA: Diagnosis not present

## 2015-12-22 MED ORDER — IPRATROPIUM-ALBUTEROL 0.5-2.5 (3) MG/3ML IN SOLN: 3.0000 mL | Freq: Four times a day (QID) | RESPIRATORY_TRACT | Status: DC | PRN

## 2015-12-22 MED ORDER — PREDNISONE 20 MG PO TABS: ORAL_TABLET | ORAL | Status: DC

## 2015-12-22 MED FILL — IPRAT-ALBUT 0.5-3(2.5) MG/3: 0.5-2.5 (3) | 20 days supply | Qty: 360 | Fill #0

## 2015-12-22 NOTE — Progress Notes (Signed)
Subjective:    Patient ID: Daniel [name], male    DOB: September 08, 1966, 50 y.o.   MRN: [id number]  HPI  Pt in with some recent pnuemonia by cxr on December 31st. Had by cxr LLL pneumonia. Pt was given z-pack antibiotic and vantin antibiotic.   Pt is still wheezing. But states when he went to UC he had severe symptoms. He has albuterol inhaler at home. He has been using this every 4 hours. Pt is also symbicort 2 inhalation twice a day. Pt states compared to day went to UC he is about 50% better.   Pt has history of diabetes. No meds for diabetes. Today bs 100-105. This was one hour after eating. Pt in past was on metformin but he did not have side effects. In past started to diet and exercise. Then diabetes improved.  Last breathing treatment 2 pm.  Pt in past states never had any hospitalizations for asthma.  Pt works in very dusty environment.     Review of Systems  Constitutional: Negative for fever, chills and fatigue.  HENT: Negative for congestion and drooling.   Respiratory: Positive for cough, shortness of breath and wheezing. Negative for chest tightness.   Cardiovascular: Negative for chest pain and palpitations.  Gastrointestinal: Negative for abdominal pain.  Musculoskeletal: Negative for back pain.  Neurological: Negative for dizziness and headaches.  Hematological: Negative for adenopathy. Does not bruise/bleed easily.  Psychiatric/Behavioral: Negative for behavioral problems and confusion.   Past Medical History  Diagnosis Date  . Hypertension   . Hyperlipidemia   . Asthma   . Depression     h/o  . Insomnia   . Polyarthralgia 2009    blood work (-) CKs slightly elevated, bone san (-) saw rheumatology; continue w/ somptoms after holding zocor  . GERD (gastroesophageal reflux disease)   . Diabetes mellitus   . H/O Clostridium difficile infection 08/2015    Social History   Social History  . Marital Status: Married    Spouse Name: N/A  . Number of Children:  1  . Years of Education: N/A   Occupational History  . 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 Smoking status: Passive Smoke Exposure - Never Smoker  . 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  . Alcohol Use: 3.6 oz/week    6 Cans of beer per week  . Drug Use: No  . Sexual Activity: Not on file   Other Topics Concern  . Not on file   Social History Narrative   Married, 1 adopted child              Past Surgical History  Procedure Laterality Date  . Hand surgery Left 2006  . Nasal fracture surgery  2006  . Hemorrhoid surgery    . Knee surgery Right 2011     Family History  Problem Relation Age of Onset  . Diabetes Mother     ??  . Diabetes Father   . Hypertension Mother   . Prostate cancer Neg Hx   . Colon cancer Neg Hx   . Coronary artery disease Neg Hx   . Stroke Neg Hx     Allergies  Allergen Reactions  . Levofloxacin Other (See Comments)    Tendon rupture at wrist    Current Outpatient Prescriptions on File Prior to Visit  Medication Sig Dispense Refill  .  albuterol (VENTOLIN HFA) 108 (90 BASE) MCG/ACT inhaler Inhale 2 puffs into the lungs every 6 (six) hours as needed for wheezing or shortness of breath. 18 g 12  . azelastine (ASTELIN) 0.1 % nasal spray Place 2 sprays into both nostrils at bedtime as needed for rhinitis. Use in each nostril as directed 30 mL 3  . budesonide-formoterol (SYMBICORT) 80-4.5 MCG/ACT inhaler Inhale 2 puffs into the lungs 2 (two) times daily. 1 Inhaler 12  . carvedilol (COREG) 6.25 MG tablet Take 1 tablet (6.25 mg total) by mouth 2 (two) times daily. 180 tablet 2  . fish oil-omega-3 fatty acids 1000 MG capsule Take 1 g by mouth daily.      . Flaxseed, Linseed, (FLAX SEED OIL PO) Take 1 each by mouth at bedtime.      Marland Kitchen losartan (COZAAR) 100 MG tablet Take 1 tablet (100 mg total) by mouth daily. 90 tablet 2  . Multiple  Vitamins-Minerals (MULTIVITAMIN,TX-MINERALS) tablet Take 1 tablet by mouth daily.      Marland Kitchen omeprazole-sodium bicarbonate (ZEGERID) 40-1100 MG per capsule take 1 capsule by mouth once daily ON AN EMPTY STOMACH 90 capsule 1  . ONE TOUCH ULTRA TEST test strip TEST three times a day 100 each 12  . ONETOUCH DELICA LANCETS 99991111 MISC CHECK BLOOD SUGAR three times a day 100 each 11  . simvastatin (ZOCOR) 20 MG tablet Take 1 tablet (20 mg total) by mouth daily. 90 tablet 2  . zolpidem (AMBIEN CR) 12.5 MG CR tablet Take 1 tablet (12.5 mg total) by mouth at bedtime as needed for sleep. 30 tablet 5   No current facility-administered medications on file prior to visit.    BP 126/76 mmHg  Pulse 71  Temp(Src) 97.8 F (36.6 C) (Oral)  Ht 5\' 11"  (1.803 m)  Wt 221 lb 6.4 oz (100.426 kg)  BMI 30.89 kg/m2  SpO2 96%       Objective:   Physical Exam  General  Mental Status - Alert. General Appearance - Well groomed. Not in acute distress.  Skin Rashes- No Rashes.  HEENT Head- Normal. Ear Auditory Canal - Left- Normal. Right - Normal.Tympanic Membrane- Left- Normal. Right- Normal. Eye Sclera/Conjunctiva- Left- Normal. Right- Normal. Nose & Sinuses Nasal Mucosa- Left-  Boggy and Congested. Right-  Boggy and  Congested.Bilateral maxillary and frontal sinus pressure. Mouth & Throat Lips: Upper Lip- Normal: no dryness, cracking, pallor, cyanosis, or vesicular eruption. Lower Lip-Normal: no dryness, cracking, pallor, cyanosis or vesicular eruption. Buccal Mucosa- Bilateral- No Aphthous ulcers. Oropharynx- No Discharge or Erythema. Tonsils: Characteristics- Bilateral- No Erythema or Congestion. Size/Enlargement- Bilateral- No enlargement. Discharge- bilateral-None.  Neck Neck- Supple. No Masses.   Chest and Lung Exam Auscultation: Breath Sounds:- even and unlabored. But shallow and expiraratory wheeze.  Cardiovascular Auscultation:Rythm- Regular, rate and rhythm. Murmurs & Other Heart  Sounds:Ausculatation of the heart reveal- No Murmurs.  Lymphatic Head & Neck General Head & Neck Lymphatics: Bilateral: Description- No Localized lymphadenopathy.       Assessment & Plan:  Work excuse. Very important not to work in light of very dusty and hot environment.  Use duoneb tx every 4-6 hours for next 2 days then as needed.  Will rx new taper dose of prednisone and start at high mg dosing.   Continue symbicort.  If breathing worsens then recommend ED eval.  cxr today to assess if persisting infection. Continue vantin antibiotic.  Follow up this coming Monday or as needed. If you symptoms area not resolving completely then refer  to pulmonologist.  Watch sugar intake while on above prednisone. Benefit of med exceeds side effects. Keep checking sugar daily.

## 2015-12-22 NOTE — Patient Instructions (Addendum)
Work excuse. Very important not to work in light of very dusty and hot environment.  Use duoneb tx every 4-6 hours for next 2 days then as needed.  Will rx new taper dose of prednisone and start at high mg dosing.   Continue symbicort.  If breathing worsens then recommend ED eval.  cxr today to assess if persisting infection. Continue vantin antibiotic.  Follow up this coming Monday or as needed. If you symptoms area not resolving completely then refer to pulmonologist.  Watch sugar intake while on above prednisone. Benefit of med exceeds side effects. Keep checking sugar daily.

## 2015-12-22 NOTE — Progress Notes (Signed)
Pre visit review using our clinic review tool, if applicable. No additional management support is needed unless otherwise documented below in the visit note. 

## 2016-02-23 ENCOUNTER — Other Ambulatory Visit: Payer: Self-pay

## 2016-02-23 MED ORDER — OMEPRAZOLE-SODIUM BICARBONATE 40-1100 MG PO CAPS: 1.0000 | ORAL_CAPSULE | Freq: Every day | ORAL | Status: DC

## 2016-02-23 MED ORDER — ONETOUCH DELICA LANCETS 33G MISC: Status: DC

## 2016-04-11 ENCOUNTER — Other Ambulatory Visit: Payer: Self-pay | Admitting: Family Medicine

## 2016-04-13 ENCOUNTER — Encounter: Payer: Self-pay | Admitting: Internal Medicine

## 2016-04-16 NOTE — Telephone Encounter (Signed)
rx printed, #30, 1 RF

## 2016-04-16 NOTE — Telephone Encounter (Signed)
Last OV: 12/22/15 Last filled: 10/20/15, #30, 5 RF Sig: Take 1 tablet (12.5 mg total) by mouth at bedtime as needed for sleep. UDS: 09/09/13

## 2016-05-23 ENCOUNTER — Other Ambulatory Visit: Payer: Self-pay

## 2016-05-23 MED ORDER — CARVEDILOL 6.25 MG PO TABS: 6.2500 mg | ORAL_TABLET | Freq: Two times a day (BID) | ORAL | Status: DC

## 2016-05-23 MED ORDER — LOSARTAN POTASSIUM 100 MG PO TABS: 100.0000 mg | ORAL_TABLET | Freq: Every day | ORAL | Status: DC

## 2016-06-07 ENCOUNTER — Encounter: Payer: Self-pay | Admitting: Internal Medicine

## 2016-06-07 ENCOUNTER — Ambulatory Visit (INDEPENDENT_AMBULATORY_CARE_PROVIDER_SITE_OTHER): Payer: Commercial Managed Care - HMO | Admitting: Internal Medicine

## 2016-06-07 VITALS — BP 118/74 | HR 60 | Temp 97.7°F | Ht 71.0 in | Wt 220.2 lb

## 2016-06-07 DIAGNOSIS — I10 Essential (primary) hypertension: Secondary | ICD-10-CM | POA: Diagnosis not present

## 2016-06-07 DIAGNOSIS — E785 Hyperlipidemia, unspecified: Secondary | ICD-10-CM | POA: Diagnosis not present

## 2016-06-07 DIAGNOSIS — Z23 Encounter for immunization: Secondary | ICD-10-CM | POA: Diagnosis not present

## 2016-06-07 DIAGNOSIS — E118 Type 2 diabetes mellitus with unspecified complications: Secondary | ICD-10-CM | POA: Diagnosis not present

## 2016-06-07 LAB — LIPID PANEL
Cholesterol: 182 mg/dL (ref 0–200)
HDL: 38.2 mg/dL — ABNORMAL LOW (ref >39.00)
LDL CALC: 106 mg/dL — AB (ref 0–99)
NONHDL: 144.2
Total CHOL/HDL Ratio: 5
Triglycerides: 192 mg/dL — ABNORMAL HIGH (ref 0.0–149.0)
VLDL: 38.4 mg/dL (ref 0.0–40.0)

## 2016-06-07 LAB — BASIC METABOLIC PANEL
BUN: 10 mg/dL (ref 6–23)
CHLORIDE: 103 meq/L (ref 96–112)
CO2: 31 meq/L (ref 19–32)
Calcium: 9.6 mg/dL (ref 8.4–10.5)
Creatinine, Ser: 1.1 mg/dL (ref 0.40–1.50)
GFR: 90.38 mL/min (ref >60.00)
GLUCOSE: 139 mg/dL — AB (ref 70–99)
POTASSIUM: 3.9 meq/L (ref 3.5–5.1)
Sodium: 137 mEq/L (ref 135–145)

## 2016-06-07 LAB — HEMOGLOBIN A1C: HEMOGLOBIN A1C: 5.4 % (ref 4.6–6.5)

## 2016-06-07 NOTE — Patient Instructions (Signed)
GO TO THE LAB : Get the blood work     GO TO THE FRONT DESK Schedule your next appointment for a  physical exam in 4 months.  Aspirin 81 mg once a day

## 2016-06-07 NOTE — Progress Notes (Signed)
Subjective:    Patient ID: Daniel [name], male    DOB: [birth year], 50 y.o.   MRN: [id number]  DOS:  06/07/2016 Type of visit - description : Routine checkup Interval history: DM: On no medications, he has been very busy, has been unable to exercise. Postprandial CBGs 130, 160-- slightly higher than before. HTN: Good med compliance, ambulatory BPs within normal Cholesterol: Off simvastatin for months, he simply ran out.   Review of Systems No chest pain or difficulty breathing No nausea vomiting or diarrhea No lower extremity paresthesias  Past Medical History  Diagnosis Date  . Hypertension   . Hyperlipidemia   . Asthma   . Depression     h/o  . Insomnia   . Polyarthralgia 2009    blood work (-) CKs slightly elevated, bone san (-) saw rheumatology; continue w/ somptoms after holding zocor  . GERD (gastroesophageal reflux disease)   . Diabetes mellitus   . H/O Clostridium difficile infection 08/2015    Past Surgical History  Procedure Laterality Date  . Hand surgery Left 2006  . Nasal fracture surgery  2006  . Hemorrhoid surgery    . Knee surgery Right 2011     Social History   Social History  . Marital Status: Married    Spouse Name: N/A  . Number of Children: 1  . Years of Education: N/A   Occupational History  . 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 Smoking status: Passive Smoke Exposure - Never Smoker  . 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  . Alcohol Use: 3.6 oz/week    6 Cans of beer per week  . Drug Use: No  . Sexual Activity: Not on file   Other Topics Concern  . Not on file   Social History Narrative   Married, 1 adopted child                  Medication List       This list is accurate as of: 06/07/16 11:59 PM.  Always use your most recent med list.               aspirin 81 MG tablet  Take 81 mg by mouth daily.       azelastine 0.1 % nasal spray  Commonly known as:  ASTELIN  Place 2 sprays into both nostrils at bedtime as needed for rhinitis. Use in each nostril as directed     carvedilol 6.25 MG tablet  Commonly known as:  COREG  Take 1 tablet (6.25 mg total) by mouth 2 (two) times daily.     fish oil-omega-3 fatty acids 1000 MG capsule  Take 1 g by mouth daily.     FLAX SEED OIL PO  Take 1 each by mouth at bedtime.     losartan 100 MG tablet  Commonly known as:  COZAAR  Take 1 tablet (100 mg total) by mouth daily.     multivitamin,tx-minerals tablet  Take 1 tablet by mouth daily.     omeprazole-sodium bicarbonate 40-1100 MG capsule  Commonly known as:  ZEGERID  Take 1 capsule by mouth daily before breakfast.     ONE TOUCH ULTRA TEST test strip  Generic drug:  glucose blood  TEST three times a day     ONETOUCH DELICA LANCETS 99991111 Misc  Check blood sugar no more  than three times daily     zolpidem 12.5 MG CR tablet  Commonly known as:  AMBIEN CR  take 1 tablet by mouth at bedtime if needed for sleep           Objective:   Physical Exam BP 118/74 mmHg  Pulse 60  Temp(Src) 97.7 F (36.5 C) (Oral)  Ht 5\' 11"  (1.803 m)  Wt 220 lb 4 oz (99.905 kg)  BMI 30.73 kg/m2  SpO2 99% General:   Well developed, well nourished . NAD.  HEENT:  Normocephalic . Face symmetric, atraumatic Lungs:  CTA B Normal respiratory effort, no intercostal retractions, no accessory muscle use. Heart: RRR,  no murmur.  No pretibial edema bilaterally  DIABETIC FEET EXAM: No lower extremity edema Normal pedal pulses bilaterally Skin normal, nails normal, no calluses Pinprick examination of the feet normal. Neurologic:  alert & oriented X3.  Speech normal, gait appropriate for age and unassisted Psych--  Cognition and judgment appear intact.  Cooperative with normal attention span and concentration.  Behavior appropriate. No anxious or depressed appearing.      Assessment & Plan:    Assessment  DM - dc metformin 08-2015, had diarrhea but likely dt cdiff, ok to restart if needed  HTN Hyperlipidemia Asthma GERD Insomnia Polyarthralgia: w/u and rheumatology eval neg 2009, stopping zocor did not help Diarrhea, + c diff 08-2015, s/p abx   PLAN: DM: On diet control only, unable to exercise lately, postprandial CBGs 130, 160. Check A1c; feet exam normal, recommend eye check HTN: Continue losartan, carvedilol. Check BMP Hyperlipidemia: Run out of simvastatin several months ago, recheck a FLP, restart simva? Rx Lipitor ? Primary care: Prevnar today, start aspirin.  RTC 4 months

## 2016-06-07 NOTE — Progress Notes (Signed)
Pre visit review using our clinic review tool, if applicable. No additional management support is needed unless otherwise documented below in the visit note. 

## 2016-06-08 NOTE — Assessment & Plan Note (Signed)
DM: On diet control only, unable to exercise lately, postprandial CBGs 130, 160. Check A1c; feet exam normal, recommend eye check HTN: Continue losartan, carvedilol. Check BMP Hyperlipidemia: Run out of simvastatin several months ago, recheck a FLP, restart simva? Rx Lipitor ? Primary care: Prevnar today, start aspirin.  RTC 4 months

## 2016-06-10 ENCOUNTER — Other Ambulatory Visit: Payer: Self-pay | Admitting: Internal Medicine

## 2016-06-11 NOTE — Telephone Encounter (Signed)
Rx faxed to [facility] pharmacy.  

## 2016-06-11 NOTE — Telephone Encounter (Signed)
Okay 30 and 2 refills 

## 2016-06-11 NOTE — Telephone Encounter (Signed)
Rx printed, awaiting MD signature.  

## 2016-06-11 NOTE — Telephone Encounter (Signed)
Pt is requesting refill on Ambien.  Last OV: 06/07/2016 Last Fill: 04/16/2016 #30 and 1RF UDS: Not needed  Please advise.

## 2016-07-26 ENCOUNTER — Other Ambulatory Visit: Payer: Self-pay | Admitting: *Deleted

## 2016-07-26 ENCOUNTER — Ambulatory Visit
Admission: RE | Admit: 2016-07-26 | Discharge: 2016-07-26 | Disposition: A | Discharge status: Home / Self Care | Payer: Commercial Managed Care - HMO | Source: Ambulatory Visit | Attending: *Deleted | Admitting: *Deleted

## 2016-07-26 DIAGNOSIS — R52 Pain, unspecified: Secondary | ICD-10-CM

## 2016-08-20 ENCOUNTER — Other Ambulatory Visit: Payer: Self-pay | Admitting: Internal Medicine

## 2016-09-04 ENCOUNTER — Other Ambulatory Visit: Payer: Self-pay | Admitting: Internal Medicine

## 2016-09-05 NOTE — Telephone Encounter (Signed)
Pt is requesting refill on Ambien.  Last OV: 06/07/2016, CPE scheduled 11/19/2016 Last Fill: 06/11/2016 #30 and 2RF UDS: Not needed for Ambien  Please advise.

## 2016-09-05 NOTE — Telephone Encounter (Signed)
Rx faxed to [facility] pharmacy.  

## 2016-09-05 NOTE — Telephone Encounter (Signed)
Ok 30 and 3 RF 

## 2016-09-05 NOTE — Telephone Encounter (Signed)
Rx printed, awaiting MD signature.  

## 2016-09-17 ENCOUNTER — Other Ambulatory Visit: Payer: Self-pay | Admitting: Internal Medicine

## 2016-11-17 ENCOUNTER — Other Ambulatory Visit: Payer: Self-pay | Admitting: Internal Medicine

## 2016-11-19 ENCOUNTER — Encounter: Payer: Self-pay | Admitting: Internal Medicine

## 2016-11-19 ENCOUNTER — Ambulatory Visit (INDEPENDENT_AMBULATORY_CARE_PROVIDER_SITE_OTHER): Payer: Commercial Managed Care - HMO | Admitting: Internal Medicine

## 2016-11-19 VITALS — BP 122/78 | HR 67 | Temp 98.0°F | Resp 14 | Ht 71.0 in | Wt 223.2 lb

## 2016-11-19 DIAGNOSIS — Z0000 Encounter for general adult medical examination without abnormal findings: Secondary | ICD-10-CM | POA: Diagnosis not present

## 2016-11-19 DIAGNOSIS — R739 Hyperglycemia, unspecified: Secondary | ICD-10-CM | POA: Diagnosis not present

## 2016-11-19 LAB — ALT: ALT: 40 U/L (ref 0–53)

## 2016-11-19 LAB — LIPID PANEL
CHOL/HDL RATIO: 5
Cholesterol: 188 mg/dL (ref 0–200)
HDL: 40.9 mg/dL (ref >39.00)
LDL CALC: 109 mg/dL — AB (ref 0–99)
NONHDL: 146.85
Triglycerides: 191 mg/dL — ABNORMAL HIGH (ref 0.0–149.0)
VLDL: 38.2 mg/dL (ref 0.0–40.0)

## 2016-11-19 LAB — CBC WITH DIFFERENTIAL/PLATELET
BASOS ABS: 0.1 10*3/uL (ref 0.0–0.1)
BASOS PCT: 0.7 % (ref 0.0–3.0)
EOS ABS: 0.4 10*3/uL (ref 0.0–0.7)
Eosinophils Relative: 4.9 % (ref 0.0–5.0)
HCT: 44.1 % (ref 39.0–52.0)
HEMOGLOBIN: 15.7 g/dL (ref 13.0–17.0)
LYMPHS PCT: 39.1 % (ref 12.0–46.0)
Lymphs Abs: 3.5 10*3/uL (ref 0.7–4.0)
MCHC: 35.6 g/dL (ref 30.0–36.0)
MCV: 85.6 fl (ref 78.0–100.0)
MONO ABS: 0.6 10*3/uL (ref 0.1–1.0)
Monocytes Relative: 6.7 % (ref 3.0–12.0)
Neutro Abs: 4.4 10*3/uL (ref 1.4–7.7)
Neutrophils Relative %: 48.6 % (ref 43.0–77.0)
Platelets: 145 10*3/uL — ABNORMAL LOW (ref 150.0–400.0)
RBC: 5.15 Mil/uL (ref 4.22–5.81)
RDW: 12.6 % (ref 11.5–15.5)
WBC: 9 10*3/uL (ref 4.0–10.5)

## 2016-11-19 LAB — BASIC METABOLIC PANEL
BUN: 9 mg/dL (ref 6–23)
CO2: 26 mEq/L (ref 19–32)
CREATININE: 1 mg/dL (ref 0.40–1.50)
Calcium: 9.7 mg/dL (ref 8.4–10.5)
Chloride: 103 mEq/L (ref 96–112)
GFR: 100.71 mL/min (ref >60.00)
GLUCOSE: 189 mg/dL — AB (ref 70–99)
Potassium: 4.1 mEq/L (ref 3.5–5.1)
Sodium: 138 mEq/L (ref 135–145)

## 2016-11-19 LAB — AST: AST: 21 U/L (ref 0–37)

## 2016-11-19 LAB — HEMOGLOBIN A1C: HEMOGLOBIN A1C: 6.6 % — AB (ref 4.6–6.5)

## 2016-11-19 MED ORDER — CYCLOBENZAPRINE HCL 10 MG PO TABS: 10.0000 mg | ORAL_TABLET | Freq: Every evening | ORAL | 0 refills | Status: DC | PRN

## 2016-11-19 NOTE — Patient Instructions (Signed)
GO TO THE LAB : Get the blood work     GO TO THE FRONT DESK Schedule your next appointment for a check up in 4 months      If you need more information about a healthy diet, heart disease, diabetes, hypertension visit: The American Heart Association, http://www.heart.org  The American diabetes Association  Http://www.diabetes.org

## 2016-11-19 NOTE — Assessment & Plan Note (Addendum)
Td 2006 had a booster 2015 ? (per pt); pnm shot 2015; prevnar 2017 Flu shot -- at work Colonoscopy 09-2014, benign polyps, 10 years Prostate cancer screening: DRE normal 2015,  consistently normal PSAs  Labs: BMP, AST, ALT, FLP, CBC, A1c Diet and exercise discussed. Sources of information provided

## 2016-11-19 NOTE — Assessment & Plan Note (Signed)
Here for a CPX. + Stress, wife had a CABG, she is getting better. DM: Diet has not been the best lately, CBGs usually 150 check. Due for A1c. HTN: Reports good  medication compliance, BP today is very good. Upper back pain: Described as a "tension". I agreed to prescribe Flexeril, also stretching exercises discussed. Call if not better RTC 4 months

## 2016-11-19 NOTE — Progress Notes (Signed)
Subjective:    Patient ID: Daniel [name], male    DOB: 22-Mar-1966, 50 y.o.   MRN: [id number]  DOS:  11/19/2016 Type of visit - description : CPX Interval history: His wife had a CABG, has been stressed out, helping her on her recovery. Hasn't been able to go to work but plans to go back this week. Diet has not been the best He is developing "headaches" when he describes the pain is mostly between the shoulder blades like a "tension". Rx a muscle relaxant?    Review of Systems Constitutional: No fever. No chills. No unexplained wt changes. No unusual sweats  HEENT: No dental problems, no ear discharge, no facial swelling, no voice changes. No eye discharge, no eye  redness , no  intolerance to light   Respiratory: No wheezing , no  difficulty breathing. No cough , no mucus production  Cardiovascular: No CP, no leg swelling , no  Palpitations  GI: no nausea, no vomiting, no diarrhea , no  abdominal pain.  No blood in the stools. No dysphagia, no odynophagia    Endocrine: No polyphagia, no polyuria , no polydipsia  GU: No dysuria, gross hematuria, difficulty urinating. No urinary urgency, no frequency.  Musculoskeletal: No joint swellings or unusual aches or pains  Skin: No change in the color of the skin, palor , no  Rash  Allergic, immunologic: No environmental allergies , no  food allergies  Neurological: No dizziness no  syncope. No headaches per se . No diplopia, no slurred, no slurred speech, no motor deficits, no facial  Numbness  Hematological: No enlarged lymph nodes, no easy bruising , no unusual bleedings  Psychiatry: No suicidal ideas, no hallucinations, no beavior problems, no confusion.  + Stress, see above    Past Medical History:  Diagnosis Date  . Asthma   . Depression    h/o  . Diabetes mellitus   . GERD (gastroesophageal reflux disease)   . H/O Clostridium difficile infection 08/2015  . Hyperlipidemia   . Hypertension   . Insomnia   .  Polyarthralgia 2009   blood work (-) CKs slightly elevated, bone san (-) saw rheumatology; continue w/ somptoms after holding zocor    Past Surgical History:  Procedure Laterality Date  . HAND SURGERY Left 2006  . HEMORRHOID SURGERY    . KNEE SURGERY Right 2011   . NASAL FRACTURE SURGERY  2006    Social History   Social History  . Marital status: Married    Spouse name: N/A  . Number of children: 1  . Years of education: N/A   Occupational History  . 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 Smoking status: Passive Smoke Exposure - Never Smoker  . 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  . Alcohol use 3.0 oz/week    5 Cans of beer per week  . Drug use: No  . Sexual activity: Not on file   Other Topics Concern  . Not on file   Social History Narrative   Married, 1 adopted child               Family History  Problem Relation Age of Onset  . Diabetes Mother     ??  . Hypertension Mother   . Diabetes Father   . Prostate cancer Neg Hx   . Colon cancer Neg Hx   .  Coronary artery disease Neg Hx   . Stroke Neg Hx       Medication List       Accurate as of 11/19/16  9:07 PM. Always use your most recent med list.          aspirin 81 MG tablet Take 81 mg by mouth daily.   azelastine 0.1 % nasal spray Commonly known as:  ASTELIN Place 2 sprays into both nostrils at bedtime as needed for rhinitis. Use in each nostril as directed   carvedilol 6.25 MG tablet Commonly known as:  COREG Take 1 tablet (6.25 mg total) by mouth 2 (two) times daily.   cyclobenzaprine 10 MG tablet Commonly known as:  FLEXERIL Take 1 tablet (10 mg total) by mouth at bedtime as needed for muscle spasms.   fish oil-omega-3 fatty acids 1000 MG capsule Take 1 g by mouth daily.   FLAX SEED OIL PO Take 1 each by mouth at bedtime.   losartan 100 MG tablet Commonly known as:   COZAAR Take 1 tablet (100 mg total) by mouth daily.   multivitamin,tx-minerals tablet Take 1 tablet by mouth daily.   niacin 500 MG tablet Take 500 mg by mouth at bedtime.   omeprazole-sodium bicarbonate 40-1100 MG capsule Commonly known as:  ZEGERID Take 1 capsule by mouth daily before breakfast.   ONE TOUCH ULTRA TEST test strip Generic drug:  glucose blood TEST three times a day   ONETOUCH DELICA LANCETS 99991111 Misc Check blood sugar no more than three times daily   PROBIOTIC DAILY PO Take by mouth.   Red Yeast Rice 600 MG Caps Take 600 mg by mouth daily.   zolpidem 12.5 MG CR tablet Commonly known as:  AMBIEN CR Take 1 tablet (12.5 mg total) by mouth at bedtime as needed for sleep.          Objective:   Physical Exam BP 122/78 (BP Location: Left Arm, Patient Position: Sitting, Cuff Size: Normal)   Pulse 67   Temp 98 F (36.7 C) (Oral)   Resp 14   Ht 5\' 11"  (1.803 m)   Wt 223 lb 4 oz (101.3 kg)   SpO2 98%   BMI 31.14 kg/m   General:   Well developed, well nourished . NAD.  Neck: No  thyromegaly  HEENT:  Normocephalic . Face symmetric, atraumatic Lungs:  CTA B Normal respiratory effort, no intercostal retractions, no accessory muscle use. Heart: RRR,  no murmur.  No pretibial edema bilaterally  Abdomen:  Not distended, soft, non-tender. No rebound or rigidity.   Skin: Exposed areas without rash. Not pale. Not jaundice Neurologic:  alert & oriented X3.  Speech normal, gait appropriate for age and unassisted Strength symmetric and appropriate for age.  Psych: Cognition and judgment appear intact.  Cooperative with normal attention span and concentration.  Behavior appropriate. No anxious or depressed appearing.    Assessment & Plan:   Assessment  DM - dc metformin 08-2015, had diarrhea but likely dt cdiff, ok to restart if needed  HTN Hyperlipidemia Asthma GERD Insomnia Polyarthralgia: w/u and rheumatology eval neg 2009, stopping zocor did not  help Diarrhea, + c diff 08-2015, s/p abx   PLAN: Here for a CPX. + Stress, wife had a CABG, she is getting better. DM: Diet has not been the best lately, CBGs usually 150 check. Due for A1c. HTN: Reports good  medication compliance, BP today is very good. Upper back pain: Described as a "tension". I agreed to prescribe Flexeril,  also stretching exercises discussed. Call if not better RTC 4 months

## 2016-11-19 NOTE — Progress Notes (Signed)
Pre visit review using our clinic review tool, if applicable. No additional management support is needed unless otherwise documented below in the visit note. 

## 2016-12-17 ENCOUNTER — Other Ambulatory Visit: Payer: Self-pay | Admitting: Internal Medicine

## 2016-12-31 ENCOUNTER — Other Ambulatory Visit: Payer: Self-pay | Admitting: Internal Medicine

## 2016-12-31 NOTE — Telephone Encounter (Signed)
Pt is requesting refill on Ambien.  Last OV: 11/19/2016 Last Fill: 09/05/2016 #30 and 3RF UDS: None  Please advise.

## 2016-12-31 NOTE — Telephone Encounter (Signed)
Ok 30 and 5 RF 

## 2016-12-31 NOTE — Telephone Encounter (Signed)
Rx printed, awaiting MD signature.  

## 2016-12-31 NOTE — Telephone Encounter (Signed)
Rx faxed to [facility] pharmacy.  

## 2017-02-22 ENCOUNTER — Encounter: Payer: Self-pay | Admitting: Internal Medicine

## 2017-02-22 ENCOUNTER — Ambulatory Visit (INDEPENDENT_AMBULATORY_CARE_PROVIDER_SITE_OTHER): Payer: Commercial Managed Care - HMO | Admitting: Internal Medicine

## 2017-02-22 VITALS — BP 128/64 | HR 66 | Temp 97.7°F | Resp 14 | Ht 71.0 in | Wt 216.1 lb

## 2017-02-22 DIAGNOSIS — B009 Herpesviral infection, unspecified: Secondary | ICD-10-CM | POA: Diagnosis not present

## 2017-02-22 DIAGNOSIS — M2550 Pain in unspecified joint: Secondary | ICD-10-CM

## 2017-02-22 DIAGNOSIS — F419 Anxiety disorder, unspecified: Secondary | ICD-10-CM

## 2017-02-22 LAB — CBC WITH DIFFERENTIAL/PLATELET
BASOS PCT: 0.7 % (ref 0.0–3.0)
Basophils Absolute: 0 10*3/uL (ref 0.0–0.1)
EOS PCT: 3.3 % (ref 0.0–5.0)
Eosinophils Absolute: 0.2 10*3/uL (ref 0.0–0.7)
HCT: 45.1 % (ref 39.0–52.0)
Hemoglobin: 15.9 g/dL (ref 13.0–17.0)
LYMPHS ABS: 2.2 10*3/uL (ref 0.7–4.0)
Lymphocytes Relative: 34.3 % (ref 12.0–46.0)
MCHC: 35.2 g/dL (ref 30.0–36.0)
MCV: 85.2 fl (ref 78.0–100.0)
MONOS PCT: 8 % (ref 3.0–12.0)
Monocytes Absolute: 0.5 10*3/uL (ref 0.1–1.0)
NEUTROS ABS: 3.5 10*3/uL (ref 1.4–7.7)
NEUTROS PCT: 53.7 % (ref 43.0–77.0)
PLATELETS: 141 10*3/uL — AB (ref 150.0–400.0)
RBC: 5.3 Mil/uL (ref 4.22–5.81)
RDW: 12.8 % (ref 11.5–15.5)
WBC: 6.5 10*3/uL (ref 4.0–10.5)

## 2017-02-22 LAB — HIGH SENSITIVITY CRP: CRP, High Sensitivity: 3.15 mg/L (ref 0.000–5.000)

## 2017-02-22 LAB — SEDIMENTATION RATE: Sed Rate: 4 mm/hr (ref 0–20)

## 2017-02-22 LAB — CK: Total CK: 121 U/L (ref 7–232)

## 2017-02-22 MED ORDER — CLONAZEPAM 0.5 MG PO TABS: 0.5000 mg | ORAL_TABLET | Freq: Every evening | ORAL | 0 refills | Status: DC | PRN

## 2017-02-22 MED ORDER — VALACYCLOVIR HCL 500 MG PO TABS: 500.0000 mg | ORAL_TABLET | Freq: Every day | ORAL | 3 refills | Status: DC

## 2017-02-22 NOTE — Progress Notes (Signed)
Subjective:    Patient ID: Daniel [name], male    DOB: 11-25-1966, 51 y.o.   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 Very busy. Since the wife had heart surgery, he has become concerned about his own heart.  Physically, he complains of back, knees, elbows, shoulder pains. Denies any redness, puffiness or swelling on the hands or wrists.  Emotionally, he feels anxious all the time. Denies depression, suicidal or homicidal ideas. "I feel like I'm in a constant state of fight". "I feel like I need to run away but I can't  let people down".  Also, having penile blisters on and off for the last 2 months, history of herpes, issue has been inactive since the 90s.   Review of Systems Denies fever chills No headaches He is very active at work, no chest pain, difficulty breathing, DOE. Occasional palpitations. + Myalgias, mostly at the back.  Past Medical History:  Diagnosis Date  . Asthma   . Depression    h/o  . Diabetes mellitus   . GERD (gastroesophageal reflux disease)   . H/O Clostridium difficile infection 08/2015  . Hyperlipidemia   . Hypertension   . Insomnia   . Polyarthralgia 2009   blood work (-) CKs slightly elevated, bone san (-) saw rheumatology; continue w/ somptoms after holding zocor    Past Surgical History:  Procedure Laterality Date  . HAND SURGERY Left 2006  . HEMORRHOID SURGERY    . KNEE SURGERY Right 2011   . NASAL FRACTURE SURGERY  2006    Social History   Social History  . Marital status: Married    Spouse name: N/A  . Number of children: 1  . Years of education: N/A   Occupational History  . 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 Smoking status: Passive Smoke Exposure - Never Smoker  . 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  . Alcohol use 3.0 oz/week    5 Cans of beer per week  . Drug use: No  . Sexual activity: Not on file   Other Topics Concern  . Not on file   Social History Narrative   Married, 1 adopted child                Allergies as of 02/22/2017      Reactions   Levofloxacin Other (See Comments)   Tendon rupture at wrist      Medication List       Accurate as of 02/22/17 11:59 PM. Always use your most recent med list.          aspirin 81 MG tablet Take 81 mg by mouth daily.   azelastine 0.1 % nasal spray Commonly known as:  ASTELIN Place 2 sprays into both nostrils at bedtime as needed for rhinitis. Use in each nostril as directed   carvedilol 6.25 MG tablet Commonly known as:  COREG Take 1 tablet (6.25 mg total) by mouth 2 (two) times daily.   clonazePAM 0.5 MG tablet Commonly known as:  KLONOPIN Take 1 tablet (0.5 mg total) by mouth at bedtime as needed for anxiety.   cyclobenzaprine 10 MG tablet Commonly known as:  FLEXERIL Take 1 tablet (  10 mg total) by mouth at bedtime as needed for muscle spasms.   fish oil-omega-3 fatty acids 1000 MG capsule Take 1 g by mouth daily.   FLAX SEED OIL PO Take 1 each by mouth at bedtime.   losartan 100 MG tablet Commonly known as:  COZAAR Take 1 tablet (100 mg total) by mouth daily.   multivitamin,tx-minerals tablet Take 1 tablet by mouth daily.   niacin 500 MG tablet Take 500 mg by mouth at bedtime.   omeprazole-sodium bicarbonate 40-1100 MG capsule Commonly known as:  ZEGERID Take 1 capsule by mouth daily before breakfast.   ONE TOUCH ULTRA TEST test strip Generic drug:  glucose blood TEST three times a day   ONETOUCH DELICA LANCETS 62V Misc Check blood sugar no more than three times daily   PROBIOTIC DAILY PO Take by mouth.   Red Yeast Rice 600 MG Caps Take 600 mg by mouth daily.   valACYclovir 500 MG tablet Commonly known as:  VALTREX Take  1 tablet (500 mg total) by mouth daily.   zolpidem 12.5 MG CR tablet Commonly known as:  AMBIEN CR Take 1 tablet (12.5 mg total) by mouth at bedtime as needed for sleep.          Objective:   Physical Exam BP 128/64 (BP Location: Left Arm, Patient Position: Sitting, Cuff Size: Normal)   Pulse 66   Temp 97.7 F (36.5 C) (Oral)   Resp 14   Ht 5\' 11"  (1.803 m)   Wt 216 lb 2 oz (98 kg)   SpO2 98%   BMI 30.14 kg/m  General:   Well developed, well nourished . NAD.  HEENT:  Normocephalic . Face symmetric, atraumatic Lungs:  CTA B Normal respiratory effort, no intercostal retractions, no accessory muscle use. Heart: RRR,  no murmur.  No pretibial edema bilaterally  Abdomen: Nontender, nondistended. MSK: Hands and wrists without synovitis. Shoulders with good range of motion  Skin: Base of the foreskin with few dry up blisters Neurologic:  alert & oriented X3.  Speech normal, gait appropriate for age and unassisted Psych--  Cognition and judgment appear intact.  Cooperative with normal attention span and concentration.  Behavior appropriate. No anxious or depressed appearing.      Assessment & Plan:   Assessment  DM - dc metformin 08-2015, had diarrhea but likely dt cdiff, ok to restart if needed  HTN Hyperlipidemia Asthma GERD Insomnia Polyarthralgia: w/u and rheumatology eval neg 2009, stopping zocor did not help Diarrhea, + c diff 08-2015, s/p abx   PLAN: Anxiety : anxiety w/o depression, PHQ 9 scored 3, neg. We talk about his anxiety rx options, in the past couldn't take Prozac b/c  "it may me like to hurt people". I recommend stress management, strongly recommend to see a counselor, information provided; also we agreed on clonazepam at bedtime to help with stress. Arthralgias, myalgias: no obvious etiology. Similar sx a few years ago, stopping statins did not help. Will check CBC, sedimentation rate, CKs, CRP Genital herpes: On and off for 2 months, start  Valtrex daily. Patient concerned about his heart, he is very active without symptoms. We'll reassess at the next visit RTC 4 - 5 weeks

## 2017-02-22 NOTE — Progress Notes (Signed)
Pre visit review using our clinic review tool, if applicable. No additional management support is needed unless otherwise documented below in the visit note. 

## 2017-02-22 NOTE — Patient Instructions (Signed)
GO TO THE LAB : Get the blood work     GO TO THE FRONT DESK Schedule your next appointment for a   Check up in 4-5 weeks   Start valtrex daily  Take clonazepam at bedtime as needed for anxiety  Consider see  counselor

## 2017-02-24 DIAGNOSIS — F419 Anxiety disorder, unspecified: Secondary | ICD-10-CM | POA: Insufficient documentation

## 2017-02-24 DIAGNOSIS — F32A Depression, unspecified: Secondary | ICD-10-CM | POA: Insufficient documentation

## 2017-02-24 DIAGNOSIS — B009 Herpesviral infection, unspecified: Secondary | ICD-10-CM | POA: Insufficient documentation

## 2017-02-24 NOTE — Assessment & Plan Note (Signed)
Anxiety : anxiety w/o depression, PHQ 9 scored 3, neg. We talk about his anxiety rx options, in the past couldn't take Prozac b/c  "it may me like to hurt people". I recommend stress management, strongly recommend to see a counselor, information provided; also we agreed on clonazepam at bedtime to help with stress. Arthralgias, myalgias: no obvious etiology. Similar sx a few years ago, stopping statins did not help. Will check CBC, sedimentation rate, CKs, CRP Genital herpes: On and off for 2 months, start Valtrex daily. Patient concerned about his heart, he is very active without symptoms. We'll reassess at the next visit RTC 4 - 5 weeks

## 2017-03-19 DIAGNOSIS — J0100 Acute maxillary sinusitis, unspecified: Secondary | ICD-10-CM | POA: Diagnosis not present

## 2017-03-19 DIAGNOSIS — R040 Epistaxis: Secondary | ICD-10-CM | POA: Diagnosis not present

## 2017-03-27 ENCOUNTER — Ambulatory Visit (INDEPENDENT_AMBULATORY_CARE_PROVIDER_SITE_OTHER): Payer: Commercial Managed Care - HMO | Admitting: Internal Medicine

## 2017-03-27 ENCOUNTER — Encounter: Payer: Self-pay | Admitting: Internal Medicine

## 2017-03-27 VITALS — BP 116/72 | HR 71 | Temp 98.1°F | Resp 14 | Ht 71.0 in | Wt 212.5 lb

## 2017-03-27 DIAGNOSIS — E118 Type 2 diabetes mellitus with unspecified complications: Secondary | ICD-10-CM

## 2017-03-27 DIAGNOSIS — I10 Essential (primary) hypertension: Secondary | ICD-10-CM | POA: Diagnosis not present

## 2017-03-27 DIAGNOSIS — Z136 Encounter for screening for cardiovascular disorders: Secondary | ICD-10-CM | POA: Diagnosis not present

## 2017-03-27 LAB — BASIC METABOLIC PANEL
BUN: 15 mg/dL (ref 6–23)
CALCIUM: 9.9 mg/dL (ref 8.4–10.5)
CHLORIDE: 102 meq/L (ref 96–112)
CO2: 25 meq/L (ref 19–32)
Creatinine, Ser: 1.13 mg/dL (ref 0.40–1.50)
GFR: 87.35 mL/min (ref >60.00)
Glucose, Bld: 124 mg/dL — ABNORMAL HIGH (ref 70–99)
Potassium: 4.2 mEq/L (ref 3.5–5.1)
SODIUM: 136 meq/L (ref 135–145)

## 2017-03-27 LAB — HEMOGLOBIN A1C: HEMOGLOBIN A1C: 6.7 % — AB (ref 4.6–6.5)

## 2017-03-27 NOTE — Progress Notes (Signed)
Pre visit review using our clinic review tool, if applicable. No additional management support is needed unless otherwise documented below in the visit note. 

## 2017-03-27 NOTE — Progress Notes (Signed)
Subjective:    Patient ID: Daniel [name], male    DOB: May 28, 1966, 51 y.o.   MRN: [id number]  DOS:  03/27/2017 Type of visit - description : f/u Interval history: Anxiety: Since the last office visit, he make some arrangements and decrease his workload, he is feeling much better, require clonazepam only 2 times and does not think he needs it  long-term. Joint  pains: Improved. He continued to be quite concerned about his heart, wife had a CABG. A couple of friends at work had heart attacks.   Review of Systems He is very active at work, does also exercise  Denies chest pain, difficulty breathing or edema.   Past Medical History:  Diagnosis Date  . Asthma   . Depression    h/o  . Diabetes mellitus   . GERD (gastroesophageal reflux disease)   . H/O Clostridium difficile infection 08/2015  . Hyperlipidemia   . Hypertension   . Insomnia   . Polyarthralgia 2009   blood work (-) CKs slightly elevated, bone san (-) saw rheumatology; continue w/ somptoms after holding zocor    Past Surgical History:  Procedure Laterality Date  . HAND SURGERY Left 2006  . HEMORRHOID SURGERY    . KNEE SURGERY Right 2011   . NASAL FRACTURE SURGERY  2006    Social History   Social History  . Marital status: Married    Spouse name: N/A  . Number of children: 1  . Years of education: N/A   Occupational History  . 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 Smoking status: Passive Smoke Exposure - Never Smoker  . 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  . Alcohol use 3.0 oz/week    5 Cans of beer per week  . Drug use: No  . Sexual activity: Not on file   Other Topics Concern  . Not on file   Social History Narrative   Married, 1 adopted child                Allergies as of 03/27/2017      Reactions   Levofloxacin Other (See Comments)   Tendon rupture at wrist        Medication List       Accurate as of 03/27/17 11:59 PM. Always use your most recent med list.          aspirin 81 MG tablet Take 81 mg by mouth daily.   azelastine 0.1 % nasal spray Commonly known as:  ASTELIN Place 2 sprays into both nostrils at bedtime as needed for rhinitis. Use in each nostril as directed   carvedilol 6.25 MG tablet Commonly known as:  COREG Take 1 tablet (6.25 mg total) by mouth 2 (two) times daily.   clonazePAM 0.5 MG tablet Commonly known as:  KLONOPIN Take 1 tablet (0.5 mg total) by mouth at bedtime as needed for anxiety.   cyclobenzaprine 10 MG tablet Commonly known as:  FLEXERIL Take 1 tablet (10 mg total) by mouth at bedtime as needed for muscle spasms.   fish oil-omega-3 fatty acids 1000 MG capsule Take 1 g by mouth daily.   FLAX SEED OIL PO Take 1 each by mouth at bedtime.   losartan 100 MG tablet Commonly known as:  COZAAR Take 1 tablet (100 mg total) by mouth daily.   multivitamin,tx-minerals tablet Take 1  tablet by mouth daily.   niacin 500 MG tablet Take 500 mg by mouth at bedtime.   omeprazole-sodium bicarbonate 40-1100 MG capsule Commonly known as:  ZEGERID Take 1 capsule by mouth daily before breakfast.   ONE TOUCH ULTRA TEST test strip Generic drug:  glucose blood TEST three times a day   ONETOUCH DELICA LANCETS 10U Misc Check blood sugar no more than three times daily   PROBIOTIC DAILY PO Take by mouth.   Red Yeast Rice 600 MG Caps Take 600 mg by mouth daily.   valACYclovir 500 MG tablet Commonly known as:  VALTREX Take 1 tablet (500 mg total) by mouth daily.   zolpidem 12.5 MG CR tablet Commonly known as:  AMBIEN CR Take 1 tablet (12.5 mg total) by mouth at bedtime as needed for sleep.          Objective:   Physical Exam BP 116/72 (BP Location: Left Arm, Patient Position: Sitting, Cuff Size: Normal)   Pulse 71   Temp 98.1 F (36.7 C) (Oral)   Resp 14   Ht 5\' 11"  (1.803 m)   Wt 212 lb 8 oz (96.4  kg)   SpO2 97%   BMI 29.64 kg/m  General:   Well developed, well nourished . NAD.  HEENT:  Normocephalic . Face symmetric, atraumatic Lungs:  CTA B Normal respiratory effort, no intercostal retractions, no accessory muscle use. Heart: RRR,  no murmur.  No pretibial edema bilaterally  Skin: Not pale. Not jaundice Neurologic:  alert & oriented X3.  Speech normal, gait appropriate for age and unassisted Psych--  Cognition and judgment appear intact.  Cooperative with normal attention span and concentration.  Behavior appropriate. No anxious or depressed appearing.      Assessment & Plan:  Assessment  DM - dc metformin 08-2015, had diarrhea but likely dt cdiff, ok to restart if needed  HTN Hyperlipidemia Anxiety: History of Prozac intolerance "it may me like to hurt people".  Asthma GERD Insomnia Polyarthralgia: w/u and rheumatology eval neg 2009, stopping zocor did not help Diarrhea, + c diff 08-2015, s/p abx   PLAN: Anxiety:  Improved, make some arrangement and his work load has decrease  , just took  Clonazepam twice,  since the last visit. Will leave that in his med list for now. DM: Diet control, check A1c HTN: Seems well-controlled, check a BMP;   EKG- nsr, no acute  CAD screening: Quite concerned about his heart health, + passive smoker, + HTN and DM. Schedule a treadmill test. Arthralgias: Labs negative, sxs improved. RTC 4 -5 months

## 2017-03-27 NOTE — Patient Instructions (Addendum)
GO TO THE LAB : Get the blood work     GO TO THE FRONT DESK Schedule your next appointment for a  routine checkup in 4-5 months  We'll schedule a stress test  -

## 2017-03-28 NOTE — Assessment & Plan Note (Signed)
Anxiety:  Improved, make some arrangement and his work load has decrease  , just took  Clonazepam twice,  since the last visit. Will leave that in his med list for now. DM: Diet control, check A1c HTN: Seems well-controlled, check a BMP;   EKG- nsr, no acute  CAD screening: Quite concerned about his heart health, + passive smoker, + HTN and DM. Schedule a treadmill test. Arthralgias: Labs negative, sxs improved. RTC 4 -5 months

## 2017-04-04 ENCOUNTER — Ambulatory Visit: Payer: Commercial Managed Care - HMO | Admitting: Internal Medicine

## 2017-04-30 DIAGNOSIS — J019 Acute sinusitis, unspecified: Secondary | ICD-10-CM | POA: Diagnosis not present

## 2017-04-30 DIAGNOSIS — R040 Epistaxis: Secondary | ICD-10-CM | POA: Diagnosis not present

## 2017-05-07 ENCOUNTER — Other Ambulatory Visit: Payer: Self-pay | Admitting: Internal Medicine

## 2017-06-20 ENCOUNTER — Other Ambulatory Visit: Payer: Self-pay | Admitting: Internal Medicine

## 2017-06-21 NOTE — Telephone Encounter (Signed)
Rx printed, awaiting MD signature.  

## 2017-06-21 NOTE — Telephone Encounter (Signed)
Okay #30 and 5 refills 

## 2017-06-21 NOTE — Telephone Encounter (Signed)
Pt is requesting refill on Ambien 12.5mg  tabs.  Last OV: 03/27/2017 Last Fill: 12/31/2016 #30 and 5RF UDS: Not needed for Ambien per PCP   [place] Controlled Substance Database printed.  Please advise.

## 2017-06-21 NOTE — Telephone Encounter (Signed)
Rx faxed to [facility] pharmacy.  

## 2017-07-30 DIAGNOSIS — J029 Acute pharyngitis, unspecified: Secondary | ICD-10-CM | POA: Diagnosis not present

## 2017-08-15 ENCOUNTER — Ambulatory Visit: Payer: Commercial Managed Care - HMO | Admitting: Internal Medicine

## 2017-08-30 ENCOUNTER — Ambulatory Visit (INDEPENDENT_AMBULATORY_CARE_PROVIDER_SITE_OTHER): Payer: 59 | Admitting: Internal Medicine

## 2017-08-30 ENCOUNTER — Encounter: Payer: Self-pay | Admitting: Internal Medicine

## 2017-08-30 ENCOUNTER — Telehealth: Payer: Self-pay

## 2017-08-30 VITALS — BP 134/78 | HR 76 | Temp 97.6°F | Resp 14 | Ht 71.0 in | Wt 218.1 lb

## 2017-08-30 DIAGNOSIS — I10 Essential (primary) hypertension: Secondary | ICD-10-CM | POA: Diagnosis not present

## 2017-08-30 DIAGNOSIS — F419 Anxiety disorder, unspecified: Secondary | ICD-10-CM

## 2017-08-30 DIAGNOSIS — E785 Hyperlipidemia, unspecified: Secondary | ICD-10-CM

## 2017-08-30 DIAGNOSIS — E119 Type 2 diabetes mellitus without complications: Secondary | ICD-10-CM | POA: Diagnosis not present

## 2017-08-30 NOTE — Progress Notes (Signed)
Subjective:    Patient ID: Daniel [name], male    DOB: [birth year], 51 y.o.   MRN: [id number]  DOS:  08/30/2017 Type of visit - description : f/u Interval history: No major concerns. Check his blood sugars from time to time, the highest he has seen his 120. Emotionally doing great, has not needed clonazepam    Review of Systems Denies lower extremity edema. No lower extremity paresthesias. Occasionally has a cough   Past Medical History:  Diagnosis Date  . Asthma   . Depression    h/o  . Diabetes mellitus   . GERD (gastroesophageal reflux disease)   . H/O Clostridium difficile infection 08/2015  . Hyperlipidemia   . Hypertension   . Insomnia   . Polyarthralgia 2009   blood work (-) CKs slightly elevated, bone san (-) saw rheumatology; continue w/ somptoms after holding zocor    Past Surgical History:  Procedure Laterality Date  . HAND SURGERY Left 2006  . HEMORRHOID SURGERY    . KNEE SURGERY Right 2011   . NASAL FRACTURE SURGERY  2006    Social History   Social History  . Marital status: Married    Spouse name: N/A  . Number of children: 1  . Years of education: N/A   Occupational History  . 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 Smoking status: Passive Smoke Exposure - Never Smoker  . 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  . Alcohol use 3.0 oz/week    5 Cans of beer per week  . Drug use: No  . Sexual activity: Not on file   Other Topics Concern  . Not on file   Social History Narrative   Married, 1 adopted child                Allergies as of 08/30/2017      Reactions   Levofloxacin Other (See Comments)   Tendon rupture at wrist      Medication List       Accurate as of 08/30/17 11:59 PM. Always use your most recent med list.          aspirin 81 MG tablet Take 81 mg by mouth daily.   azelastine 0.1 % nasal  spray Commonly known as:  ASTELIN Place 2 sprays into both nostrils at bedtime as needed for rhinitis. Use in each nostril as directed   carvedilol 6.25 MG tablet Commonly known as:  COREG Take 1 tablet (6.25 mg total) by mouth 2 (two) times daily.   clonazePAM 0.5 MG tablet Commonly known as:  KLONOPIN Take 1 tablet (0.5 mg total) by mouth at bedtime as needed for anxiety.   fish oil-omega-3 fatty acids 1000 MG capsule Take 1 g by mouth daily.   FLAX SEED OIL PO Take 1 each by mouth at bedtime.   losartan 100 MG tablet Commonly known as:  COZAAR Take 1 tablet (100 mg total) by mouth daily.   multivitamin,tx-minerals tablet Take 1 tablet by mouth daily.   niacin 500 MG tablet Take 500 mg by mouth at bedtime.   omeprazole-sodium bicarbonate 40-1100 MG capsule Commonly known as:  ZEGERID Take 1 capsule by mouth daily before breakfast.   ONE TOUCH ULTRA TEST test strip Generic drug:  glucose blood TEST three times a day   ONETOUCH DELICA LANCETS 54Y Misc Check blood sugar  no more than three times daily   PROBIOTIC DAILY PO Take by mouth.   Red Yeast Rice 600 MG Caps Take 600 mg by mouth daily.   valACYclovir 500 MG tablet Commonly known as:  VALTREX Take 1 tablet (500 mg total) by mouth daily.   zolpidem 12.5 MG CR tablet Commonly known as:  AMBIEN CR Take 1 tablet (12.5 mg total) by mouth at bedtime as needed for sleep.            Discharge Care Instructions        Start     Ordered   08/30/17 0100  Basic metabolic panel     71/21/97 0913   08/30/17 0000  Lipid panel     08/30/17 0913   08/30/17 0000  Hemoglobin A1c     08/30/17 0913         Objective:   Physical Exam BP 134/78 (BP Location: Left Arm, Patient Position: Sitting, Cuff Size: Small)   Pulse 76   Temp 97.6 F (36.4 C) (Oral)   Resp 14   Ht 5\' 11"  (1.803 m)   Wt 218 lb 2 oz (98.9 kg)   SpO2 98%   BMI 30.42 kg/m  General:   Well developed, well nourished . NAD.  HEENT:   Normocephalic . Face symmetric, atraumatic Lungs:  CTA B Normal respiratory effort, no intercostal retractions, no accessory muscle use. Heart: RRR,  no murmur.  No pretibial edema bilaterally  DIABETIC FEET EXAM: No lower extremity edema Normal pedal pulses bilaterally Skin normal, nails normal, no calluses Pinprick examination of the feet normal. Neurologic:  alert & oriented X3.  Speech normal, gait appropriate for age and unassisted Psych--  Cognition and judgment appear intact.  Cooperative with normal attention span and concentration.  Behavior appropriate. No anxious or depressed appearing.      Assessment & Plan:  Assessment  DM - dc metformin 08-2015, had diarrhea but likely dt cdiff, ok to restart if needed  HTN Hyperlipidemia Anxiety: History of Prozac intolerance "it may me like to hurt people".  Asthma GERD Insomnia Polyarthralgia: w/u and rheumatology eval neg 2009, stopping zocor did not help Diarrhea, + c diff 08-2015, s/p abx   PLAN: DM: Diet control, last A1c was 6.7, check A1c and FLP, diet and exercise discussed. Feet exam negative today. Recommend regular eye exams HTN: BP seems well-controlled, continue carvedilol, losartan.  Check a BMP Anxiety: Controlled, has not needed clonazepam. CAD screening:  treadmill test was Rx but is pending. Will try to set up. We'll get a flu shot at work. RTC, CPX 3-4 months

## 2017-08-30 NOTE — Patient Instructions (Signed)
GO TO THE LAB : Get the blood work     GO TO THE FRONT DESK Schedule your next appointment for a  physical exam in 3-4 months.

## 2017-08-30 NOTE — Progress Notes (Signed)
Pre visit review using our clinic review tool, if applicable. No additional management support is needed unless otherwise documented below in the visit note. 

## 2017-08-30 NOTE — Telephone Encounter (Signed)
Pt needed exercise tolerance test (stress test)- order was placed in April 2018- and was sent to cardiology for scheduling. Per Pt- he never received call and PCP is still needing procedure. Can we f/u w/ cardiology to make sure this is done? Thank you.

## 2017-08-31 LAB — BASIC METABOLIC PANEL
BUN: 15 mg/dL (ref 7–25)
CO2: 22 mmol/L (ref 20–32)
CREATININE: 1.06 mg/dL (ref 0.70–1.33)
Calcium: 9.2 mg/dL (ref 8.6–10.3)
Chloride: 103 mmol/L (ref 98–110)
GLUCOSE: 140 mg/dL — AB (ref 65–99)
Potassium: 4 mmol/L (ref 3.5–5.3)
Sodium: 136 mmol/L (ref 135–146)

## 2017-08-31 LAB — HEMOGLOBIN A1C
HEMOGLOBIN A1C: 5.8 %{Hb} — AB (ref <5.7)
Mean Plasma Glucose: 120 (calc)
eAG (mmol/L): 6.6 (calc)

## 2017-08-31 LAB — LIPID PANEL
CHOL/HDL RATIO: 5.1 (calc) — AB (ref <5.0)
CHOLESTEROL: 193 mg/dL (ref <200)
HDL: 38 mg/dL — AB (ref >40)
LDL CHOLESTEROL (CALC): 123 mg/dL — AB
NON-HDL CHOLESTEROL (CALC): 155 mg/dL — AB (ref <130)
TRIGLYCERIDES: 204 mg/dL — AB (ref <150)

## 2017-08-31 NOTE — Assessment & Plan Note (Signed)
DM: Diet control, last A1c was 6.7, check A1c and FLP, diet and exercise discussed. Feet exam negative today. Recommend regular eye exams HTN: BP seems well-controlled, continue carvedilol, losartan.  Check a BMP Anxiety: Controlled, has not needed clonazepam. CAD screening:  treadmill test was Rx but is pending. Will try to set up. We'll get a flu shot at work. RTC, CPX 3-4 months

## 2017-09-02 NOTE — Telephone Encounter (Signed)
Order resent, awaiting appt

## 2017-09-06 NOTE — Telephone Encounter (Signed)
Pt scheduled 09/11/2017.

## 2017-09-11 ENCOUNTER — Ambulatory Visit (INDEPENDENT_AMBULATORY_CARE_PROVIDER_SITE_OTHER): Payer: 59

## 2017-09-11 DIAGNOSIS — Z136 Encounter for screening for cardiovascular disorders: Secondary | ICD-10-CM | POA: Diagnosis not present

## 2017-09-11 LAB — EXERCISE TOLERANCE TEST
Estimated workload: 11.7 METS
Exercise duration (min): 10 min
Exercise duration (sec): 0 s
MPHR: 167 {beats}/min
Peak HR: 155 {beats}/min
Percent HR: 92 %
RPE: 15
Rest HR: 79 {beats}/min

## 2017-09-13 ENCOUNTER — Other Ambulatory Visit: Payer: Self-pay

## 2017-09-13 MED ORDER — CARVEDILOL 12.5 MG PO TABS: 12.5000 mg | ORAL_TABLET | Freq: Two times a day (BID) | ORAL | 5 refills | Status: DC

## 2017-10-17 ENCOUNTER — Other Ambulatory Visit: Payer: Self-pay | Admitting: Internal Medicine

## 2017-11-04 DIAGNOSIS — R102 Pelvic and perineal pain: Secondary | ICD-10-CM | POA: Diagnosis not present

## 2017-11-04 DIAGNOSIS — N400 Enlarged prostate without lower urinary tract symptoms: Secondary | ICD-10-CM | POA: Diagnosis not present

## 2017-11-26 ENCOUNTER — Other Ambulatory Visit: Payer: Self-pay | Admitting: Internal Medicine

## 2017-12-12 ENCOUNTER — Other Ambulatory Visit: Payer: Self-pay | Admitting: Internal Medicine

## 2017-12-12 NOTE — Telephone Encounter (Signed)
30 and 1 RF sent

## 2017-12-12 NOTE — Telephone Encounter (Signed)
Pt is requesting refill on Ambien.  Last OV: 08/30/2017 Last Fill: 06/21/2017 #30 and 5RF UDS: Not needed for Ambien.    Please advise.

## 2018-01-02 ENCOUNTER — Encounter: Payer: Self-pay | Admitting: Internal Medicine

## 2018-01-02 ENCOUNTER — Other Ambulatory Visit: Payer: Self-pay | Admitting: Internal Medicine

## 2018-01-02 ENCOUNTER — Ambulatory Visit (INDEPENDENT_AMBULATORY_CARE_PROVIDER_SITE_OTHER): Payer: 59 | Admitting: Internal Medicine

## 2018-01-02 VITALS — BP 126/68 | HR 68 | Temp 97.8°F | Resp 14 | Ht 71.0 in | Wt 215.5 lb

## 2018-01-02 DIAGNOSIS — E1165 Type 2 diabetes mellitus with hyperglycemia: Secondary | ICD-10-CM | POA: Diagnosis not present

## 2018-01-02 DIAGNOSIS — Z0000 Encounter for general adult medical examination without abnormal findings: Secondary | ICD-10-CM

## 2018-01-02 LAB — COMPREHENSIVE METABOLIC PANEL
ALBUMIN: 4.3 g/dL (ref 3.5–5.2)
ALT: 27 U/L (ref 0–53)
AST: 19 U/L (ref 0–37)
Alkaline Phosphatase: 100 U/L (ref 39–117)
BUN: 13 mg/dL (ref 6–23)
CALCIUM: 9.4 mg/dL (ref 8.4–10.5)
CHLORIDE: 101 meq/L (ref 96–112)
CO2: 27 mEq/L (ref 19–32)
CREATININE: 0.94 mg/dL (ref 0.40–1.50)
GFR: 107.7 mL/min (ref >60.00)
Glucose, Bld: 209 mg/dL — ABNORMAL HIGH (ref 70–99)
POTASSIUM: 3.9 meq/L (ref 3.5–5.1)
Sodium: 137 mEq/L (ref 135–145)
Total Bilirubin: 1.1 mg/dL (ref 0.2–1.2)
Total Protein: 6.7 g/dL (ref 6.0–8.3)

## 2018-01-02 LAB — LIPID PANEL
CHOL/HDL RATIO: 5
CHOLESTEROL: 171 mg/dL (ref 0–200)
HDL: 36.5 mg/dL — ABNORMAL LOW (ref >39.00)
NonHDL: 134.03
TRIGLYCERIDES: 202 mg/dL — AB (ref 0.0–149.0)
VLDL: 40.4 mg/dL — AB (ref 0.0–40.0)

## 2018-01-02 LAB — LDL CHOLESTEROL, DIRECT: Direct LDL: 103 mg/dL

## 2018-01-02 LAB — PSA: PSA: 0.77 ng/mL (ref 0.10–4.00)

## 2018-01-02 LAB — HEMOGLOBIN A1C: Hgb A1c MFr Bld: 8.1 % — ABNORMAL HIGH (ref 4.6–6.5)

## 2018-01-02 NOTE — Assessment & Plan Note (Signed)
DM: Diet controlled, due to stress he has been overeating; he is very active at work.   He anticipates A1c is going to be higher than before plans to go back to a healthier diet. HTN: Well-controlled, continue losartan, carvedilol. Hyperlipidemia diet controlled, his LDL high, consider statins. Anxiety- insomnia: Increased stressors in life.  On Ambien most nights, occasional clonazepam. RTC 6 months

## 2018-01-02 NOTE — Progress Notes (Signed)
Pre visit review using our clinic review tool, if applicable. No additional management support is needed unless otherwise documented below in the visit note. 

## 2018-01-02 NOTE — Assessment & Plan Note (Signed)
Td 2014; ; pnm shot 2015; prevnar 2017; had a flu shot -- at work -CCS: Colonoscopy 09-2014, benign polyps, 10 years -Prostate cancer screening: DRE today limited but negative.  Check a PSA -Labs: CMP, FLP, A1c, PSA  -Diet has not been healthy lately but plans to improve.  He remains active at work

## 2018-01-02 NOTE — Patient Instructions (Signed)
GO TO THE LAB : Get the blood work     GO TO THE FRONT DESK Schedule your next appointment for a checkup in 6 months   Check the  blood pressure  monthly   Be sure your blood pressure is between 110/65 and  135/85. If it is consistently higher or lower, let me know    Back Exercises If you have pain in your back, do these exercises 2-3 times each day or as told by your doctor. When the pain goes away, do the exercises once each day, but repeat the steps more times for each exercise (do more repetitions). If you do not have pain in your back, do these exercises once each day or as told by your doctor. Exercises Single Knee to Chest  Do these steps 3-5 times in a row for each leg: 1. Lie on your back on a firm bed or the floor with your legs stretched out. 2. Bring one knee to your chest. 3. Hold your knee to your chest by grabbing your knee or thigh. 4. Pull on your knee until you feel a gentle stretch in your lower back. 5. Keep doing the stretch for 10-30 seconds. 6. Slowly let go of your leg and straighten it.  Pelvic Tilt  Do these steps 5-10 times in a row: 1. Lie on your back on a firm bed or the floor with your legs stretched out. 2. Bend your knees so they point up to the ceiling. Your feet should be flat on the floor. 3. Tighten your lower belly (abdomen) muscles to press your lower back against the floor. This will make your tailbone point up to the ceiling instead of pointing down to your feet or the floor. 4. Stay in this position for 5-10 seconds while you gently tighten your muscles and breathe evenly.  Cat-Cow  Do these steps until your lower back bends more easily: 1. Get on your hands and knees on a firm surface. Keep your hands under your shoulders, and keep your knees under your hips. You may put padding under your knees. 2. Let your head hang down, and make your tailbone point down to the floor so your lower back is round like the back of a cat. 3. Stay in  this position for 5 seconds. 4. Slowly lift your head and make your tailbone point up to the ceiling so your back hangs low (sags) like the back of a cow. 5. Stay in this position for 5 seconds.  Press-Ups  Do these steps 5-10 times in a row: 1. Lie on your belly (face-down) on the floor. 2. Place your hands near your head, about shoulder-width apart. 3. While you keep your back relaxed and keep your hips on the floor, slowly straighten your arms to raise the top half of your body and lift your shoulders. Do not use your back muscles. To make yourself more comfortable, you may change where you place your hands. 4. Stay in this position for 5 seconds. 5. Slowly return to lying flat on the floor.  Bridges  Do these steps 10 times in a row: 1. Lie on your back on a firm surface. 2. Bend your knees so they point up to the ceiling. Your feet should be flat on the floor. 3. Tighten your butt muscles and lift your butt off of the floor until your waist is almost as high as your knees. If you do not feel the muscles working in your butt and  the back of your thighs, slide your feet 1-2 inches farther away from your butt. 4. Stay in this position for 3-5 seconds. 5. Slowly lower your butt to the floor, and let your butt muscles relax.  If this exercise is too easy, try doing it with your arms crossed over your chest. Belly Crunches  Do these steps 5-10 times in a row: 1. Lie on your back on a firm bed or the floor with your legs stretched out. 2. Bend your knees so they point up to the ceiling. Your feet should be flat on the floor. 3. Cross your arms over your chest. 4. Tip your chin a little bit toward your chest but do not bend your neck. 5. Tighten your belly muscles and slowly raise your chest just enough to lift your shoulder blades a tiny bit off of the floor. 6. Slowly lower your chest and your head to the floor.  Back Lifts Do these steps 5-10 times in a row: 1. Lie on your belly  (face-down) with your arms at your sides, and rest your forehead on the floor. 2. Tighten the muscles in your legs and your butt. 3. Slowly lift your chest off of the floor while you keep your hips on the floor. Keep the back of your head in line with the curve in your back. Look at the floor while you do this. 4. Stay in this position for 3-5 seconds. 5. Slowly lower your chest and your face to the floor.  Contact a doctor if:  Your back pain gets a lot worse when you do an exercise.  Your back pain does not lessen 2 hours after you exercise. If you have any of these problems, stop doing the exercises. Do not do them again unless your doctor says it is okay. Get help right away if:  You have sudden, very bad back pain. If this happens, stop doing the exercises. Do not do them again unless your doctor says it is okay. This information is not intended to replace advice given to you by your health care provider. Make sure you discuss any questions you have with your health care provider. Document Released: 01/05/2011 Document Revised: 05/10/2016 Document Reviewed: 01/27/2015 Elsevier Interactive Patient Education  [employer].

## 2018-01-02 NOTE — Progress Notes (Signed)
Subjective:    Patient ID: Daniel [name], male    DOB: 1966-01-29, 52 y.o.   MRN: [id number]  DOS:  01/02/2018 Type of visit - description : cpx Interval history: Here for CPX.   Review of Systems A lot of  stress, job and finances related. Had a fall, landed on his buttocks ~09-2017, since then having some back pain with certain movements.  Other than above, a 14 point review of systems is negative     Past Medical History:  Diagnosis Date  . Asthma   . Depression    h/o  . Diabetes mellitus   . GERD (gastroesophageal reflux disease)   . H/O Clostridium difficile infection 08/2015  . Hyperlipidemia   . Hypertension   . Insomnia   . Polyarthralgia 2009   blood work (-) CKs slightly elevated, bone san (-) saw rheumatology; continue w/ somptoms after holding zocor    Past Surgical History:  Procedure Laterality Date  . HAND SURGERY Left 2006  . HEMORRHOID SURGERY    . KNEE SURGERY Right 2011   . NASAL FRACTURE SURGERY  2006    Social History   Socioeconomic History  . Marital status: Married    Spouse name: Not on file  . Number of children: 1  . Years of education: Not on file  . Highest education level: Not on file  Social Needs  . Financial resource strain: Not on file  . Food insecurity - worry: Not on file  . Food insecurity - inability: Not on file  . Transportation needs - medical: Not on file  . Transportation needs - non-medical: Not on file  Occupational History  . Occupation: Glass blower/designer at [employer]  . Occupation: retired Firefighter  Tobacco Use  . Smoking status: Passive Smoke Exposure - Never Smoker  . Smokeless tobacco: Never Used  . 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  Substance and Sexual Activity  . Alcohol use: Yes    Alcohol/week: 3.0 oz    Types: 5 Cans of beer per week  . Drug use: No  . Sexual activity: Not on file  Other Topics Concern  . Not on file  Social History Narrative   Married, 1 adopted child               Family History  Problem Relation Age of Onset  . Diabetes Mother        ??  . Hypertension Mother   . Diabetes Father   . Prostate cancer Neg Hx   . Colon cancer Neg Hx   . Coronary artery disease Neg Hx   . Stroke Neg Hx      Allergies as of 01/02/2018      Reactions   Levofloxacin Other (See Comments)   Tendon rupture at wrist      Medication List        Accurate as of 01/02/18  1:06 PM. Always use your most recent med list.          aspirin 81 MG tablet Take 81 mg by mouth daily.   carvedilol 12.5 MG tablet Commonly known as:  COREG Take 1 tablet (12.5 mg total) by mouth 2 (two) times daily with a meal.   clonazePAM 0.5 MG tablet Commonly known as:  KLONOPIN Take 1 tablet (0.5 mg total) by mouth at bedtime as needed for anxiety.   fish oil-omega-3 fatty acids 1000 MG capsule Take 1 g by mouth daily.  FLAX SEED OIL PO Take 1 each by mouth at bedtime.   losartan 100 MG tablet Commonly known as:  COZAAR Take 1 tablet (100 mg total) by mouth daily.   multivitamin,tx-minerals tablet Take 1 tablet by mouth daily.   niacin 500 MG tablet Take 500 mg by mouth at bedtime.   omeprazole-sodium bicarbonate 40-1100 MG capsule Commonly known as:  ZEGERID Take 1 capsule by mouth daily before breakfast.   ONE TOUCH ULTRA TEST test strip Generic drug:  glucose blood TEST three times a day   ONETOUCH DELICA LANCETS 49Q Misc CHECK BLOOD SUGAR NO MORE THAN 3 TIMES A DAY   PROBIOTIC DAILY PO Take by mouth.   Red Yeast Rice 600 MG Caps Take 600 mg by mouth daily.   valACYclovir 500 MG tablet Commonly known as:  VALTREX Take 1 tablet (500 mg total) by mouth daily.   zolpidem 12.5 MG CR tablet Commonly known as:  AMBIEN CR take 1 tablet by mouth at bedtime if needed for sleep          Objective:   Physical Exam BP 126/68 (BP Location: Left Arm, Patient Position: Sitting, Cuff Size: Small)   Pulse 68   Temp  97.8 F (36.6 C) (Oral)   Resp 14   Ht 5\' 11"  (1.803 m)   Wt 215 lb 8 oz (97.8 kg)   SpO2 97%   BMI 30.06 kg/m  General:   Well developed, well nourished . NAD.  Neck: No  thyromegaly  HEENT:  Normocephalic . Face symmetric, atraumatic Lungs:  CTA B Normal respiratory effort, no intercostal retractions, no accessory muscle use. Heart: RRR,  no murmur.  No pretibial edema bilaterally  Abdomen:  Not distended, soft, non-tender. No rebound or rigidity.   Skin: Exposed areas without rash. Not pale. Not jaundice Rectal:  External abnormalities: Skin tags . Normal sphincter tone. No rectal masses or tenderness.  Stool brown  Prostate: Limited exam, hard to reach the prostate but seems normal. Neurologic:  alert & oriented X3.  Speech normal, gait appropriate for age and unassisted Strength symmetric and appropriate for age.  Psych: Cognition and judgment appear intact.  Cooperative with normal attention span and concentration.  Behavior appropriate. No anxious or depressed appearing.     Assessment & Plan:   Assessment  DM - dc metformin 08-2015, had diarrhea but likely dt cdiff, ok to restart if needed  HTN Hyperlipidemia Anxiety: History of Prozac intolerance "it may me like to hurt people".  Asthma GERD Insomnia Polyarthralgia: w/u and rheumatology eval neg 2009, stopping zocor did not help Diarrhea, + c diff 08-2015, s/p abx   PLAN: DM: Diet controlled, due to stress he has been overeating; he is very active at work.   He anticipates A1c is going to be higher than before plans to go back to a healthier diet. HTN: Well-controlled, continue losartan, carvedilol. Hyperlipidemia diet controlled, his LDL high, consider statins. Anxiety- insomnia: Increased stressors in life.  On Ambien most nights, occasional clonazepam. RTC 6 months

## 2018-01-03 NOTE — Telephone Encounter (Signed)
Okay to refill clonazepam.  He reported back pain yesterday @ the OV and stated muscle relaxant help.  Refill Flexeril.

## 2018-01-03 NOTE — Telephone Encounter (Signed)
Pt is requesting refill on Flexeril 10mg  (no longer on med list) and clonazepam 0.5mg .   Last OV: 01/03/2018 Last Fill: 02/22/2017 #30 and 0RF UDS: None   Please advise.

## 2018-01-06 MED ORDER — METFORMIN HCL 500 MG PO TABS: 500.0000 mg | ORAL_TABLET | Freq: Two times a day (BID) | ORAL | 6 refills | Status: DC

## 2018-01-06 NOTE — Addendum Note (Signed)
Addended byDamita Dunnings D on: 01/06/2018 08:49 AM   Modules accepted: Orders

## 2018-02-06 ENCOUNTER — Telehealth: Payer: Self-pay | Admitting: Internal Medicine

## 2018-02-07 NOTE — Telephone Encounter (Signed)
RX sent, agree w/ future UDS if in long term clonazepam

## 2018-02-07 NOTE — Telephone Encounter (Signed)
Pt is requesting refill on Ambien CR 12.5mg .   Last OV: 01/02/2018 Last Fill: 12/12/2017 #30 and 1RF UDS: None (not needed for Ambien only per PCP, however Pt now on Klonopin 0.5mg  and Ambien- will get UDS at next OV)  [place] printed- no issues noted  Please advise.

## 2018-02-11 ENCOUNTER — Other Ambulatory Visit: Payer: Self-pay | Admitting: Internal Medicine

## 2018-04-06 ENCOUNTER — Other Ambulatory Visit: Payer: Self-pay | Admitting: Internal Medicine

## 2018-04-11 ENCOUNTER — Encounter: Payer: Self-pay | Admitting: Internal Medicine

## 2018-04-11 ENCOUNTER — Other Ambulatory Visit (INDEPENDENT_AMBULATORY_CARE_PROVIDER_SITE_OTHER): Payer: 59

## 2018-04-11 DIAGNOSIS — E1165 Type 2 diabetes mellitus with hyperglycemia: Secondary | ICD-10-CM

## 2018-04-11 LAB — HEMOGLOBIN A1C: HEMOGLOBIN A1C: 5.3 % (ref 4.6–6.5)

## 2018-04-11 LAB — ALT: ALT: 21 U/L (ref 0–53)

## 2018-04-11 LAB — AST: AST: 15 U/L (ref 0–37)

## 2018-04-15 MED ORDER — METFORMIN HCL 500 MG PO TABS: 500.0000 mg | ORAL_TABLET | Freq: Two times a day (BID) | ORAL | 6 refills | Status: DC

## 2018-04-15 NOTE — Addendum Note (Signed)
Addended byDamita Dunnings D on: 04/15/2018 07:33 AM   Modules accepted: Orders

## 2018-04-22 ENCOUNTER — Other Ambulatory Visit: Payer: Self-pay | Admitting: Internal Medicine

## 2018-05-03 ENCOUNTER — Other Ambulatory Visit: Payer: Self-pay | Admitting: Internal Medicine

## 2018-05-17 HISTORY — PX: HERNIA REPAIR: SHX51

## 2018-05-26 DIAGNOSIS — R05 Cough: Secondary | ICD-10-CM | POA: Diagnosis not present

## 2018-05-26 DIAGNOSIS — J019 Acute sinusitis, unspecified: Secondary | ICD-10-CM | POA: Diagnosis not present

## 2018-06-01 ENCOUNTER — Telehealth: Payer: Self-pay | Admitting: Internal Medicine

## 2018-06-02 NOTE — Telephone Encounter (Signed)
Sent!

## 2018-06-02 NOTE — Telephone Encounter (Signed)
Pt is requesting refill on Ambien  Last OV: 01/02/2018 Last Fill: 02/07/2018 #30 and 3rf UDS: Not needed for Ambien per PCP  NCCR in media from 02/07/2018- no discrepancies noted   Please advise.

## 2018-07-28 ENCOUNTER — Other Ambulatory Visit: Payer: Self-pay | Admitting: Internal Medicine

## 2018-07-28 NOTE — Telephone Encounter (Signed)
[name] is requesting refill on Ambien CR 12.5mg . Daniel [name].   Last OV: 01/02/2018 Last Fill: 06/02/2018 #30 and 1RF UDS: Not needed for Ambien per PCP   Please advise in PCP absence.

## 2018-07-29 ENCOUNTER — Telehealth: Payer: Self-pay

## 2018-07-29 NOTE — Telephone Encounter (Signed)
PA initiated via Covermymeds; KEY: [id number]. Informed by Covermymeds to inform dispensing pharmacy to call help line at [phone]. Information faxed to [place].

## 2018-08-28 ENCOUNTER — Other Ambulatory Visit: Payer: Self-pay | Admitting: Family Medicine

## 2018-08-29 ENCOUNTER — Telehealth: Payer: Self-pay | Admitting: Internal Medicine

## 2018-08-29 MED ORDER — ZOLPIDEM TARTRATE ER 12.5 MG PO TBCR: 12.5000 mg | EXTENDED_RELEASE_TABLET | Freq: Every evening | ORAL | 1 refills | Status: DC | PRN

## 2018-08-29 NOTE — Telephone Encounter (Signed)
Requesting: Ambien 12.5mg  HS prn Contract: 2014 UDS: none found Last OV: 01/02/18 Next Ov: 01/09/2019 Last refill: 07/28/18 #30, 0RF Database: none found  Please advise.

## 2018-08-29 NOTE — Telephone Encounter (Signed)
Copied from [place] [id number]. Topic: Quick Communication - Rx Refill/Question >> Aug 29, 2018 11:49 AM Blase Mess A wrote: Medication: zolpidem (Ambien CR) 12.5mg  CR  Has the patient contacted their pharmacy?Yes Pharmacy said that they contacted the office as well (Agent: If no, request that the patient contact the pharmacy for the refill.) (Agent: If yes, when and what did the pharmacy advise?)  Preferred Pharmacy (with phone number or street name):[id number] [place], [place] [address] [place] [address] Phone: [phone] Fax: [phone]    Agent: Please be advised that RX refills may take up to 3 business days. We ask that you follow-up with your pharmacy.

## 2018-08-29 NOTE — Telephone Encounter (Signed)
RX Sent. 

## 2018-09-01 NOTE — Telephone Encounter (Signed)
Medication already sent by PCP on 08/29/18

## 2018-10-24 ENCOUNTER — Telehealth: Payer: Self-pay | Admitting: Internal Medicine

## 2018-10-24 NOTE — Telephone Encounter (Signed)
Pt has appt scheduled 01/09/2019.

## 2018-10-24 NOTE — Telephone Encounter (Signed)
Pt is requesting refill on Ambien CR 12.5mg    Last OV: 01/02/2018 Last Fill: 08/29/2018 #30 and 1RF UDS: Not needed for Ambien per PCP  NCCR printed- Pt received: -  -hydrocodone-acetaminophen 5-325mg  on 06/12/2018 #10 tablets and 06/14/2018 #10 tablets from Dr. Remo Lipps Muscoreil which is consistent with Pt having surgery on 04/23/7198 for umbilical hernia repair.  -Guaifenesin-codeine syrup #127mL from Dr. Molli Barrows on 05/26/2018  No other discrepancies noted- NCCR sent for scanning

## 2018-10-24 NOTE — Telephone Encounter (Signed)
Ok RF #15, advise pt: Unable to refill more, he is overdue for routine office visit

## 2018-10-24 NOTE — Telephone Encounter (Signed)
thx

## 2018-10-30 ENCOUNTER — Ambulatory Visit: Payer: 59 | Admitting: Internal Medicine

## 2018-10-30 ENCOUNTER — Encounter: Payer: Self-pay | Admitting: Internal Medicine

## 2018-10-30 VITALS — BP 128/78 | HR 73 | Temp 98.0°F | Resp 16 | Ht 71.0 in | Wt 224.0 lb

## 2018-10-30 DIAGNOSIS — E1165 Type 2 diabetes mellitus with hyperglycemia: Secondary | ICD-10-CM

## 2018-10-30 DIAGNOSIS — I10 Essential (primary) hypertension: Secondary | ICD-10-CM | POA: Diagnosis not present

## 2018-10-30 DIAGNOSIS — Z23 Encounter for immunization: Secondary | ICD-10-CM | POA: Diagnosis not present

## 2018-10-30 DIAGNOSIS — M722 Plantar fascial fibromatosis: Secondary | ICD-10-CM | POA: Diagnosis not present

## 2018-10-30 DIAGNOSIS — G4700 Insomnia, unspecified: Secondary | ICD-10-CM

## 2018-10-30 MED ORDER — ZOLPIDEM TARTRATE ER 12.5 MG PO TBCR: 12.5000 mg | EXTENDED_RELEASE_TABLET | Freq: Every evening | ORAL | 1 refills | Status: DC | PRN

## 2018-10-30 NOTE — Progress Notes (Signed)
Pre visit review using our clinic review tool, if applicable. No additional management support is needed unless otherwise documented below in the visit note. 

## 2018-10-30 NOTE — Patient Instructions (Addendum)
Get heel support  Stretch your tendons twice a day  ICE once or twice a day  Call if not gradually better   Heel Spur A heel spur is a bony growth that forms on the bottom of your heel bone (calcaneus). Heel spurs are common and do not always cause pain. However, heel spurs often cause inflammation in the strong band of tissue that runs underneath the bone of your foot (plantar fascia). When this happens, you may feel pain on the bottom of your foot, near your heel. What are the causes? The cause of heel spurs is not completely understood. They may be caused by pressure on the heel. Or, they may stem from the muscle attachments (tendons) near the spur pulling on the heel. What increases the risk? You may be at risk for a heel spur if you:  Are older than 40.  Are overweight.  Have wear and tear arthritis (osteoarthritis).  Have plantar fascia inflammation.  What are the signs or symptoms? Some people have heel spurs but no symptoms. If you do have symptoms, they may include:  Pain in the bottom of your heel.  Pain that is worse when you first get out of bed.  Pain that gets worse after walking or standing.  How is this diagnosed? Your health care provider may diagnose a heel spur based on your symptoms and a physical exam. You may also have an X-ray of your foot to check for a bony growth coming from the calcaneus. How is this treated? Treatment aims to relieve the pain from the heel spur. This may include:  Stretching exercises.  Losing weight.  Wearing specific shoes, inserts, or orthotics for comfort and support.  Wearing splints at night to properly position your feet.  Taking over-the-counter medicine to relieve pain.  Being treated with high-intensity sound waves to break up the heel spur (extracorporeal shock wave therapy).  Getting steroid injections in your heel to reduce swelling and ease pain.  Having surgery if your heel spur causes long-term (chronic)  pain.  Follow these instructions at home:  Take medicines only as directed by your health care provider.  Ask your health care provider if you should use ice or cold packs on the painful areas of your heel or foot.  Avoid activities that cause you pain until you recover or as directed by your health care provider.  Stretch before exercising or being physically active.  Wear supportive shoes that fit well as directed by your health care provider. You might need to buy new shoes. Wearing old shoes or shoes that do not fit correctly may not provide the support that you need.  Lose weight if your health care provider thinks you should. This can relieve pressure on your foot that may be causing pain and discomfort. Contact a health care provider if:  Your pain continues or gets worse. This information is not intended to replace advice given to you by your health care provider. Make sure you discuss any questions you have with your health care provider. Document Released: 01/09/2006 Document Revised: 05/10/2016 Document Reviewed: 02/03/2014 Elsevier Interactive Patient Education  [employer].

## 2018-10-30 NOTE — Progress Notes (Signed)
Subjective:    Patient ID: Daniel [name], male    DOB: 08-Apr-1966, 52 y.o.   MRN: [id number]  DOS:  10/30/2018 Type of visit - description : acute  Interval history: Sx started 4 weeks ago: Intense pain at the plantar aspect of the left heel. Feels like he has a "rock" in the shoe. He is very active at work, do a lot of walking and lifting. Denies injury per se No swelling. No similar problems before.   Review of Systems   Past Medical History:  Diagnosis Date  . Asthma   . Depression    h/o  . Diabetes mellitus   . GERD (gastroesophageal reflux disease)   . H/O Clostridium difficile infection 08/2015  . Hyperlipidemia   . Hypertension   . Insomnia   . Polyarthralgia 2009   blood work (-) CKs slightly elevated, bone san (-) saw rheumatology; continue w/ somptoms after holding zocor    Past Surgical History:  Procedure Laterality Date  . HAND SURGERY Left 2006  . HEMORRHOID SURGERY    . HERNIA REPAIR  74/9449   umbilical  . KNEE SURGERY Right 2011   . NASAL FRACTURE SURGERY  2006    Social History   Socioeconomic History  . Marital status: Married    Spouse name: Not on file  . Number of children: 1  . Years of education: Not on file  . Highest education level: Not on file  Occupational History  . Occupation: Glass blower/designer at [employer]  . Occupation: retired Firefighter  Social Needs  . Financial resource strain: Not on file  . Food insecurity:    Worry: Not on file    Inability: Not on file  . Transportation needs:    Medical: Not on file    Non-medical: Not on file  Tobacco Use  . Smoking status: Passive Smoke Exposure - Never Smoker  . Smokeless tobacco: Never Used  . 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  Substance and Sexual Activity  . Alcohol use: Yes    Alcohol/week: 5.0 standard drinks    Types: 5 Cans of beer per week  . Drug use: No  . Sexual activity: Not on file  Lifestyle  . Physical  activity:    Days per week: Not on file    Minutes per session: Not on file  . Stress: Not on file  Relationships  . Social connections:    Talks on phone: Not on file    Gets together: Not on file    Attends religious service: Not on file    Active member of club or organization: Not on file    Attends meetings of clubs or organizations: Not on file    Relationship status: Not on file  . Intimate partner violence:    Fear of current or ex partner: Not on file    Emotionally abused: Not on file    Physically abused: Not on file    Forced sexual activity: Not on file  Other Topics Concern  . Not on file  Social History Narrative   Married, 1 adopted child                Allergies as of 10/30/2018      Reactions   Levofloxacin Other (See Comments)   Tendon rupture at wrist      Medication List        Accurate as of 10/30/18 11:59 PM. Always use your  most recent med list.          aspirin 81 MG tablet Take 81 mg by mouth daily.   carvedilol 12.5 MG tablet Commonly known as:  COREG Take 1 tablet (12.5 mg total) by mouth 2 (two) times daily with a meal.   clonazePAM 0.5 MG tablet Commonly known as:  KLONOPIN take 1 tablet by mouth at bedtime if needed for anxiety   cyclobenzaprine 10 MG tablet Commonly known as:  FLEXERIL Take 1 tablet (10 mg total) by mouth at bedtime as needed for muscle spasms.   fish oil-omega-3 fatty acids 1000 MG capsule Take 1 g by mouth daily.   FLAX SEED OIL PO Take 1 each by mouth at bedtime.   losartan 100 MG tablet Commonly known as:  COZAAR Take 1 tablet (100 mg total) by mouth daily.   metFORMIN 500 MG tablet Commonly known as:  GLUCOPHAGE Take 1 tablet (500 mg total) by mouth 2 (two) times daily with a meal. For the first week take only 1 tablet by mouth daily   multivitamin,tx-minerals tablet Take 1 tablet by mouth daily.   niacin 500 MG tablet Take 500 mg by mouth at bedtime.   omeprazole-sodium bicarbonate  40-1100 MG capsule Commonly known as:  ZEGERID Take 1 capsule by mouth daily before breakfast.   ONE TOUCH ULTRA TEST test strip Generic drug:  glucose blood TEST three times a day   ONETOUCH DELICA LANCETS 32G Misc CHECK BLOOD SUGAR NO MORE THAN 3 TIMES A DAY   PROBIOTIC DAILY PO Take by mouth.   Red Yeast Rice 600 MG Caps Take 600 mg by mouth daily.   valACYclovir 500 MG tablet Commonly known as:  VALTREX Take 1 tablet (500 mg total) by mouth daily.   zolpidem 12.5 MG CR tablet Commonly known as:  AMBIEN CR Take 1 tablet (12.5 mg total) by mouth at bedtime as needed for sleep.          Objective:   Physical Exam BP 128/78 (BP Location: Left Arm, Patient Position: Sitting, Cuff Size: Small)   Pulse 73   Temp 98 F (36.7 C) (Oral)   Resp 16   Ht 5\' 11"  (1.803 m)   Wt 224 lb (101.6 kg)   SpO2 98%   BMI 31.24 kg/m  General:   Well developed, NAD, BMI noted. HEENT:  Normocephalic . Face symmetric, atraumatic DIABETIC FEET EXAM: No lower extremity edema Normal pedal pulses bilaterally Skin normal, nails normal, no calluses Pinprick examination of the feet normal. MSK: Slightly TTP at the plantar heel on the left.  No mass, swelling. Skin: Not pale. Not jaundice Neurologic:  alert & oriented X3.  Speech normal, gait appropriate for age and unassisted Psych--  Cognition and judgment appear intact.  Cooperative with normal attention span and concentration.  Behavior appropriate. No anxious or depressed appearing.      Assessment & Plan:    Assessment  DM - dc metformin 08-2015, had diarrhea but likely dt cdiff, ok to restart if needed  HTN Hyperlipidemia Anxiety: History of Prozac intolerance "it may me like to hurt people".  Asthma GERD Insomnia Polyarthralgia: w/u and rheumatology eval neg 2009, stopping zocor did not help Diarrhea, + c diff 08-2015, s/p abx   PLAN: Plantar fasciitis: Rx ice once or twice a day, told the patient how to stretch the  area, use heel inserts, Tylenol as needed.  He has been using ibuprofen, recommend to switch to Tylenol. Call if not gradually  better, sports med referral?. Insomnia: Refill Ambien DM: Negative foot exam RTC : has a f/u  in few weeks.

## 2018-11-02 NOTE — Assessment & Plan Note (Signed)
Plantar fasciitis: Rx ice once or twice a day, told the patient how to stretch the area, use heel inserts, Tylenol as needed.  He has been using ibuprofen, recommend to switch to Tylenol. Call if not gradually better, sports med referral?. Insomnia: Refill Ambien DM: Negative foot exam RTC : has a f/u  in few weeks.

## 2018-11-20 ENCOUNTER — Other Ambulatory Visit: Payer: Self-pay

## 2018-11-20 MED ORDER — LOSARTAN POTASSIUM 100 MG PO TABS: 100.0000 mg | ORAL_TABLET | Freq: Every day | ORAL | 2 refills | Status: DC

## 2018-11-20 MED ORDER — VALACYCLOVIR HCL 500 MG PO TABS: 500.0000 mg | ORAL_TABLET | Freq: Every day | ORAL | 3 refills | Status: DC

## 2018-11-21 DIAGNOSIS — J4521 Mild intermittent asthma with (acute) exacerbation: Secondary | ICD-10-CM | POA: Diagnosis not present

## 2018-11-27 DIAGNOSIS — N50811 Right testicular pain: Secondary | ICD-10-CM | POA: Diagnosis not present

## 2018-11-27 DIAGNOSIS — N400 Enlarged prostate without lower urinary tract symptoms: Secondary | ICD-10-CM | POA: Diagnosis not present

## 2018-11-27 DIAGNOSIS — N50812 Left testicular pain: Secondary | ICD-10-CM | POA: Diagnosis not present

## 2018-11-27 LAB — PSA: PSA: 0.53

## 2018-11-27 LAB — HM DIABETES EYE EXAM

## 2018-12-04 ENCOUNTER — Encounter: Payer: Self-pay | Admitting: Internal Medicine

## 2018-12-17 ENCOUNTER — Other Ambulatory Visit: Payer: Self-pay | Admitting: Internal Medicine

## 2019-01-04 ENCOUNTER — Telehealth: Payer: Self-pay | Admitting: Internal Medicine

## 2019-01-05 NOTE — Telephone Encounter (Signed)
Sent  Has an appoint 01/09/2019

## 2019-01-05 NOTE — Telephone Encounter (Signed)
Pt is requesting refill on Ambien CR.   Last OV: 10/30/2018 Last Fill: 10/30/2018 #30 and 1rf UDS: None

## 2019-01-09 ENCOUNTER — Ambulatory Visit (INDEPENDENT_AMBULATORY_CARE_PROVIDER_SITE_OTHER): Payer: 59 | Admitting: Internal Medicine

## 2019-01-09 ENCOUNTER — Encounter: Payer: Self-pay | Admitting: Internal Medicine

## 2019-01-09 VITALS — BP 124/72 | HR 72 | Temp 97.7°F | Resp 16 | Ht 71.0 in | Wt 222.4 lb

## 2019-01-09 DIAGNOSIS — E1165 Type 2 diabetes mellitus with hyperglycemia: Secondary | ICD-10-CM | POA: Diagnosis not present

## 2019-01-09 DIAGNOSIS — Z23 Encounter for immunization: Secondary | ICD-10-CM

## 2019-01-09 DIAGNOSIS — Z0000 Encounter for general adult medical examination without abnormal findings: Secondary | ICD-10-CM | POA: Diagnosis not present

## 2019-01-09 LAB — COMPREHENSIVE METABOLIC PANEL
ALK PHOS: 71 U/L (ref 39–117)
ALT: 32 U/L (ref 0–53)
AST: 23 U/L (ref 0–37)
Albumin: 4.3 g/dL (ref 3.5–5.2)
BILIRUBIN TOTAL: 1 mg/dL (ref 0.2–1.2)
BUN: 14 mg/dL (ref 6–23)
CALCIUM: 9.3 mg/dL (ref 8.4–10.5)
CO2: 28 meq/L (ref 19–32)
Chloride: 102 mEq/L (ref 96–112)
Creatinine, Ser: 0.99 mg/dL (ref 0.40–1.50)
GFR: 95.09 mL/min (ref >60.00)
Glucose, Bld: 116 mg/dL — ABNORMAL HIGH (ref 70–99)
POTASSIUM: 4.1 meq/L (ref 3.5–5.1)
Sodium: 138 mEq/L (ref 135–145)
Total Protein: 6.4 g/dL (ref 6.0–8.3)

## 2019-01-09 LAB — CBC WITH DIFFERENTIAL/PLATELET
BASOS PCT: 0.5 % (ref 0.0–3.0)
Basophils Absolute: 0 10*3/uL (ref 0.0–0.1)
EOS PCT: 5.5 % — AB (ref 0.0–5.0)
Eosinophils Absolute: 0.4 10*3/uL (ref 0.0–0.7)
HEMATOCRIT: 41.6 % (ref 39.0–52.0)
HEMOGLOBIN: 14.9 g/dL (ref 13.0–17.0)
LYMPHS PCT: 34.3 % (ref 12.0–46.0)
Lymphs Abs: 2.5 10*3/uL (ref 0.7–4.0)
MCHC: 35.7 g/dL (ref 30.0–36.0)
MCV: 88.1 fl (ref 78.0–100.0)
Monocytes Absolute: 0.7 10*3/uL (ref 0.1–1.0)
Monocytes Relative: 9.7 % (ref 3.0–12.0)
Neutro Abs: 3.7 10*3/uL (ref 1.4–7.7)
Neutrophils Relative %: 50 % (ref 43.0–77.0)
Platelets: 130 10*3/uL — ABNORMAL LOW (ref 150.0–400.0)
RBC: 4.73 Mil/uL (ref 4.22–5.81)
RDW: 13.1 % (ref 11.5–15.5)
WBC: 7.3 10*3/uL (ref 4.0–10.5)

## 2019-01-09 LAB — LIPID PANEL
CHOL/HDL RATIO: 4
Cholesterol: 166 mg/dL (ref 0–200)
HDL: 37.1 mg/dL — AB (ref >39.00)
LDL Cholesterol: 94 mg/dL (ref 0–99)
NonHDL: 128.7
TRIGLYCERIDES: 176 mg/dL — AB (ref 0.0–149.0)
VLDL: 35.2 mg/dL (ref 0.0–40.0)

## 2019-01-09 LAB — TSH: TSH: 1.51 u[IU]/mL (ref 0.35–4.50)

## 2019-01-09 LAB — HEMOGLOBIN A1C: Hgb A1c MFr Bld: 7 % — ABNORMAL HIGH (ref 4.6–6.5)

## 2019-01-09 MED ORDER — PANTOPRAZOLE SODIUM 40 MG PO TBEC: 40.0000 mg | DELAYED_RELEASE_TABLET | Freq: Every day | ORAL | 3 refills | Status: DC

## 2019-01-09 NOTE — Progress Notes (Signed)
Subjective:    Patient ID: Daniel [name], male    DOB: Dec 21, 1965, 53 y.o.   MRN: [id number]  DOS:  01/09/2019 Type of visit - description: CPX In general feels well, no major concerns  Review of Systems Had an asthma exacerbation last month, was prescribed prednisone, his sugars typically run 80- 90 but while on prednisone therapy CBGs went as high as 200. Currently doing better, CBGs in the 160s.  Other than above, a 14 point review of systems is negative    Past Medical History:  Diagnosis Date  . Asthma   . Depression    h/o  . Diabetes mellitus   . GERD (gastroesophageal reflux disease)   . H/O Clostridium difficile infection 08/2015  . Hyperlipidemia   . Hypertension   . Insomnia   . Polyarthralgia 2009   blood work (-) CKs slightly elevated, bone san (-) saw rheumatology; continue w/ somptoms after holding zocor    Past Surgical History:  Procedure Laterality Date  . HAND SURGERY Left 2006  . HEMORRHOID SURGERY    . HERNIA REPAIR  71/6967   umbilical  . KNEE SURGERY Right 2011   . NASAL FRACTURE SURGERY  2006    Social History   Socioeconomic History  . Marital status: Married    Spouse name: Not on file  . Number of children: 1  . Years of education: Not on file  . Highest education level: Not on file  Occupational History  . Occupation: Glass blower/designer at [employer]  . Occupation: retired Firefighter  Social Needs  . Financial resource strain: Not on file  . Food insecurity:    Worry: Not on file    Inability: Not on file  . Transportation needs:    Medical: Not on file    Non-medical: Not on file  Tobacco Use  . Smoking status: Passive Smoke Exposure - Never Smoker  . Smokeless tobacco: Never Used  . 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  Substance and Sexual Activity  . Alcohol use: Yes    Alcohol/week: 5.0 standard drinks    Types: 5 Cans of beer per week  . Drug use: No  . Sexual activity: Not on  file  Lifestyle  . Physical activity:    Days per week: Not on file    Minutes per session: Not on file  . Stress: Not on file  Relationships  . Social connections:    Talks on phone: Not on file    Gets together: Not on file    Attends religious service: Not on file    Active member of club or organization: Not on file    Attends meetings of clubs or organizations: Not on file    Relationship status: Not on file  . Intimate partner violence:    Fear of current or ex partner: Not on file    Emotionally abused: Not on file    Physically abused: Not on file    Forced sexual activity: Not on file  Other Topics Concern  . Not on file  Social History Narrative   Married, 1 adopted child               Family History  Problem Relation Age of Onset  . Diabetes Mother        ??  . Hypertension Mother   . Diabetes Father   . Prostate cancer Neg Hx   . Colon cancer Neg Hx   .  Coronary artery disease Neg Hx   . Stroke Neg Hx      Allergies as of 01/09/2019      Reactions   Levofloxacin Other (See Comments)   Tendon rupture at wrist      Medication List       Accurate as of January 09, 2019 11:59 PM. Always use your most recent med list.        aspirin 81 MG tablet Take 81 mg by mouth daily.   carvedilol 12.5 MG tablet Commonly known as:  COREG Take 1 tablet (12.5 mg total) by mouth 2 (two) times daily with a meal.   clonazePAM 0.5 MG tablet Commonly known as:  KLONOPIN take 1 tablet by mouth at bedtime if needed for anxiety   cyclobenzaprine 10 MG tablet Commonly known as:  FLEXERIL Take 1 tablet (10 mg total) by mouth at bedtime as needed for muscle spasms.   fish oil-omega-3 fatty acids 1000 MG capsule Take 1 g by mouth daily.   FLAX SEED OIL PO Take 1 each by mouth at bedtime.   losartan 100 MG tablet Commonly known as:  COZAAR Take 1 tablet (100 mg total) by mouth daily.   metFORMIN 500 MG tablet Commonly known as:  GLUCOPHAGE Take 1 tablet (500  mg total) by mouth 2 (two) times daily with a meal. For the first week take only 1 tablet by mouth daily   multivitamin,tx-minerals tablet Take 1 tablet by mouth daily.   niacin 500 MG tablet Take 500 mg by mouth at bedtime.   ONE TOUCH ULTRA TEST test strip Generic drug:  glucose blood TEST three times a day   ONETOUCH DELICA LANCETS 15V Misc CHECK BLOOD SUGAR NO MORE THAN 3 TIMES A DAY   pantoprazole 40 MG tablet Commonly known as:  PROTONIX Take 1 tablet (40 mg total) by mouth daily before breakfast.   PROBIOTIC DAILY PO Take by mouth.   Red Yeast Rice 600 MG Caps Take 600 mg by mouth daily.   valACYclovir 500 MG tablet Commonly known as:  VALTREX Take 1 tablet (500 mg total) by mouth daily.   zolpidem 12.5 MG CR tablet Commonly known as:  AMBIEN CR TAKE 1 TABLET(12.5 MG) BY MOUTH AT BEDTIME AS NEEDED FOR SLEEP           Objective:   Physical Exam BP 124/72 (BP Location: Left Arm, Patient Position: Sitting, Cuff Size: Normal)   Pulse 72   Temp 97.7 F (36.5 C) (Oral)   Resp 16   Ht 5\' 11"  (1.803 m)   Wt 222 lb 6 oz (100.9 kg)   SpO2 97%   BMI 31.02 kg/m  General: Well developed, NAD, BMI noted Neck: No  thyromegaly  HEENT:  Normocephalic . Face symmetric, atraumatic Lungs:  CTA B Normal respiratory effort, no intercostal retractions, no accessory muscle use. Heart: RRR,  no murmur.  No pretibial edema bilaterally  Abdomen:  Not distended, soft, non-tender. No rebound or rigidity.   Skin: Exposed areas without rash. Not pale. Not jaundice Neurologic:  alert & oriented X3.  Speech normal, gait appropriate for age and unassisted Strength symmetric and appropriate for age.  Psych: Cognition and judgment appear intact.  Cooperative with normal attention span and concentration.  Behavior appropriate. No anxious or depressed appearing.     Assessment      Assessment  DM - dc metformin 08-2015, had diarrhea but likely dt cdiff, ok to restart if  needed  HTN Hyperlipidemia Anxiety,  insomnia, History of Prozac intolerance "it may me like to hurt people".  Asthma GERD Insomnia Polyarthralgia: w/u and rheumatology eval neg 2009, stopping zocor did not help Diarrhea, + c diff 08-2015, s/p abx   PLAN: DM: Doing well with diet and exercise.  Check A1c, continue metformin.  (Lately CBGs were elevated due to prednisone therapy, they are going back to normal) HTN: Seems well controlled on carvedilol, losartan, checking labs. Hyperlipidemia: On multiple OTCs, his CV risk in 10 years is 18.9.  Advised patient that we are checking his cholesterol and even if the numbers look good he would benefit from statins.  He is willing to give it a try. Anxiety, insomnia: On Ambien, get a contract.  Hardly ever uses clonazepam but will leave it in the medication list. RTC 6 months

## 2019-01-09 NOTE — Progress Notes (Signed)
Pre visit review using our clinic review tool, if applicable. No additional management support is needed unless otherwise documented below in the visit note. 

## 2019-01-09 NOTE — Assessment & Plan Note (Addendum)
-   Td 2014; ; pnm shot 2015; prevnar 2017; had a flu shot  at work Shingrix discussed, proceed with #1 today. -CCS: Colonoscopy 09-2014, benign polyps, 10 years -Prostate cancer screening: DRE-PSA 2019 neg -Labs: CMP, FLP, CBC, A1c, TSH -Remains very active at work, diet is satisfactory.

## 2019-01-09 NOTE — Patient Instructions (Signed)
GO TO THE LAB : Get the blood work     GO TO THE FRONT DESK Schedule your next appointment   checkup in 6 months  Come back in 2 months for your second round of Shingrix

## 2019-01-10 ENCOUNTER — Encounter: Payer: Self-pay | Admitting: Internal Medicine

## 2019-01-10 NOTE — Assessment & Plan Note (Signed)
DM: Doing well with diet and exercise.  Check A1c, continue metformin.  (Lately CBGs were elevated due to prednisone therapy, they are going back to normal) HTN: Seems well controlled on carvedilol, losartan, checking labs. Hyperlipidemia: On multiple OTCs, his CV risk in 10 years is 18.9.  Advised patient that we are checking his cholesterol and even if the numbers look good he would benefit from statins.  He is willing to give it a try. Anxiety, insomnia: On Ambien, get a contract.  Hardly ever uses clonazepam but will leave it in the medication list. RTC 6 months

## 2019-01-11 ENCOUNTER — Other Ambulatory Visit: Payer: Self-pay | Admitting: Internal Medicine

## 2019-01-12 MED ORDER — METFORMIN HCL 850 MG PO TABS: 850.0000 mg | ORAL_TABLET | Freq: Two times a day (BID) | ORAL | 6 refills | Status: DC

## 2019-01-12 NOTE — Addendum Note (Signed)
Addended byDamita Dunnings D on: 01/12/2019 04:48 PM   Modules accepted: Orders

## 2019-02-09 ENCOUNTER — Encounter (HOSPITAL_COMMUNITY): Payer: Self-pay | Admitting: Emergency Medicine

## 2019-02-09 ENCOUNTER — Inpatient Hospital Stay (HOSPITAL_COMMUNITY)
Admission: EM | Admit: 2019-02-09 | Discharge: 2019-02-13 | DRG: 964 | Disposition: A | Discharge status: Home Health | Payer: No Typology Code available for payment source | Attending: General Surgery | Admitting: General Surgery

## 2019-02-09 ENCOUNTER — Emergency Department (HOSPITAL_COMMUNITY): Payer: No Typology Code available for payment source

## 2019-02-09 DIAGNOSIS — S2241XA Multiple fractures of ribs, right side, initial encounter for closed fracture: Secondary | ICD-10-CM | POA: Diagnosis not present

## 2019-02-09 DIAGNOSIS — S32029A Unspecified fracture of second lumbar vertebra, initial encounter for closed fracture: Secondary | ICD-10-CM | POA: Diagnosis present

## 2019-02-09 DIAGNOSIS — S32019A Unspecified fracture of first lumbar vertebra, initial encounter for closed fracture: Secondary | ICD-10-CM | POA: Diagnosis present

## 2019-02-09 DIAGNOSIS — Z8249 Family history of ischemic heart disease and other diseases of the circulatory system: Secondary | ICD-10-CM

## 2019-02-09 DIAGNOSIS — E119 Type 2 diabetes mellitus without complications: Secondary | ICD-10-CM | POA: Diagnosis present

## 2019-02-09 DIAGNOSIS — M4856XA Collapsed vertebra, not elsewhere classified, lumbar region, initial encounter for fracture: Secondary | ICD-10-CM | POA: Diagnosis present

## 2019-02-09 DIAGNOSIS — S22081A Stable burst fracture of T11-T12 vertebra, initial encounter for closed fracture: Secondary | ICD-10-CM | POA: Diagnosis present

## 2019-02-09 DIAGNOSIS — M549 Dorsalgia, unspecified: Secondary | ICD-10-CM

## 2019-02-09 DIAGNOSIS — Z79899 Other long term (current) drug therapy: Secondary | ICD-10-CM

## 2019-02-09 DIAGNOSIS — S81812A Laceration without foreign body, left lower leg, initial encounter: Secondary | ICD-10-CM | POA: Diagnosis not present

## 2019-02-09 DIAGNOSIS — S22089A Unspecified fracture of T11-T12 vertebra, initial encounter for closed fracture: Secondary | ICD-10-CM | POA: Diagnosis present

## 2019-02-09 DIAGNOSIS — Y9389 Activity, other specified: Secondary | ICD-10-CM

## 2019-02-09 DIAGNOSIS — Z7984 Long term (current) use of oral hypoglycemic drugs: Secondary | ICD-10-CM

## 2019-02-09 DIAGNOSIS — S32039A Unspecified fracture of third lumbar vertebra, initial encounter for closed fracture: Secondary | ICD-10-CM | POA: Diagnosis present

## 2019-02-09 DIAGNOSIS — S280XXA Crushed chest, initial encounter: Principal | ICD-10-CM | POA: Diagnosis present

## 2019-02-09 DIAGNOSIS — Z7982 Long term (current) use of aspirin: Secondary | ICD-10-CM

## 2019-02-09 DIAGNOSIS — S381XXA Crushing injury of abdomen, lower back, and pelvis, initial encounter: Secondary | ICD-10-CM | POA: Diagnosis present

## 2019-02-09 DIAGNOSIS — Z833 Family history of diabetes mellitus: Secondary | ICD-10-CM

## 2019-02-09 DIAGNOSIS — S32009A Unspecified fracture of unspecified lumbar vertebra, initial encounter for closed fracture: Secondary | ICD-10-CM

## 2019-02-09 DIAGNOSIS — X58XXXA Exposure to other specified factors, initial encounter: Secondary | ICD-10-CM

## 2019-02-09 DIAGNOSIS — S270XXA Traumatic pneumothorax, initial encounter: Secondary | ICD-10-CM

## 2019-02-09 DIAGNOSIS — Y9269 Other specified industrial and construction area as the place of occurrence of the external cause: Secondary | ICD-10-CM

## 2019-02-09 DIAGNOSIS — S32040A Wedge compression fracture of fourth lumbar vertebra, initial encounter for closed fracture: Secondary | ICD-10-CM | POA: Diagnosis not present

## 2019-02-09 DIAGNOSIS — S22050A Wedge compression fracture of T5-T6 vertebra, initial encounter for closed fracture: Secondary | ICD-10-CM | POA: Diagnosis not present

## 2019-02-09 DIAGNOSIS — G4700 Insomnia, unspecified: Secondary | ICD-10-CM | POA: Diagnosis present

## 2019-02-09 DIAGNOSIS — R1110 Vomiting, unspecified: Secondary | ICD-10-CM | POA: Diagnosis not present

## 2019-02-09 DIAGNOSIS — W230XXA Caught, crushed, jammed, or pinched between moving objects, initial encounter: Secondary | ICD-10-CM | POA: Diagnosis present

## 2019-02-09 DIAGNOSIS — Z881 Allergy status to other antibiotic agents status: Secondary | ICD-10-CM

## 2019-02-09 DIAGNOSIS — Z7722 Contact with and (suspected) exposure to environmental tobacco smoke (acute) (chronic): Secondary | ICD-10-CM | POA: Diagnosis present

## 2019-02-09 DIAGNOSIS — K219 Gastro-esophageal reflux disease without esophagitis: Secondary | ICD-10-CM | POA: Diagnosis present

## 2019-02-09 DIAGNOSIS — S32049A Unspecified fracture of fourth lumbar vertebra, initial encounter for closed fracture: Secondary | ICD-10-CM | POA: Diagnosis present

## 2019-02-09 DIAGNOSIS — S22059A Unspecified fracture of T5-T6 vertebra, initial encounter for closed fracture: Secondary | ICD-10-CM | POA: Diagnosis present

## 2019-02-09 DIAGNOSIS — I10 Essential (primary) hypertension: Secondary | ICD-10-CM | POA: Diagnosis present

## 2019-02-09 DIAGNOSIS — S22009A Unspecified fracture of unspecified thoracic vertebra, initial encounter for closed fracture: Secondary | ICD-10-CM

## 2019-02-09 DIAGNOSIS — Y990 Civilian activity done for income or pay: Secondary | ICD-10-CM

## 2019-02-09 DIAGNOSIS — S2249XA Multiple fractures of ribs, unspecified side, initial encounter for closed fracture: Secondary | ICD-10-CM

## 2019-02-09 DIAGNOSIS — S22071A Stable burst fracture of T9-T10 vertebra, initial encounter for closed fracture: Secondary | ICD-10-CM | POA: Diagnosis present

## 2019-02-09 DIAGNOSIS — E785 Hyperlipidemia, unspecified: Secondary | ICD-10-CM | POA: Diagnosis present

## 2019-02-09 DIAGNOSIS — M4854XA Collapsed vertebra, not elsewhere classified, thoracic region, initial encounter for fracture: Secondary | ICD-10-CM | POA: Diagnosis present

## 2019-02-09 DIAGNOSIS — J939 Pneumothorax, unspecified: Secondary | ICD-10-CM

## 2019-02-09 LAB — CBC
HCT: 43.2 % (ref 39.0–52.0)
Hemoglobin: 15.4 g/dL (ref 13.0–17.0)
MCH: 31 pg (ref 26.0–34.0)
MCHC: 35.6 g/dL (ref 30.0–36.0)
MCV: 86.9 fL (ref 80.0–100.0)
Platelets: 134 10*3/uL — ABNORMAL LOW (ref 150–400)
RBC: 4.97 MIL/uL (ref 4.22–5.81)
RDW: 13.4 % (ref 11.5–15.5)
WBC: 13.6 10*3/uL — ABNORMAL HIGH (ref 4.0–10.5)
nRBC: 0 % (ref 0.0–0.2)

## 2019-02-09 LAB — COMPREHENSIVE METABOLIC PANEL
ALT: 70 U/L — ABNORMAL HIGH (ref 0–44)
ANION GAP: 11 (ref 5–15)
AST: 74 U/L — ABNORMAL HIGH (ref 15–41)
Albumin: 4.6 g/dL (ref 3.5–5.0)
Alkaline Phosphatase: 79 U/L (ref 38–126)
BUN: 15 mg/dL (ref 6–20)
CO2: 24 mmol/L (ref 22–32)
Calcium: 9.5 mg/dL (ref 8.9–10.3)
Chloride: 101 mmol/L (ref 98–111)
Creatinine, Ser: 0.98 mg/dL (ref 0.61–1.24)
GFR calc Af Amer: >60 mL/min (ref >60)
GFR calc non Af Amer: >60 mL/min (ref >60)
Glucose, Bld: 120 mg/dL — ABNORMAL HIGH (ref 70–99)
Potassium: 4.7 mmol/L (ref 3.5–5.1)
Sodium: 136 mmol/L (ref 135–145)
Total Bilirubin: 1.1 mg/dL (ref 0.3–1.2)
Total Protein: 7.2 g/dL (ref 6.5–8.1)

## 2019-02-09 MED ORDER — OXYCODONE-ACETAMINOPHEN 5-325 MG PO TABS
2.0000 | ORAL_TABLET | Freq: Once | ORAL | Status: AC
  Administered 2019-02-09: 2 via ORAL
  Filled 2019-02-09: qty 2

## 2019-02-09 MED ORDER — FENTANYL CITRATE (PF) 100 MCG/2ML IJ SOLN
100.0000 ug | Freq: Once | INTRAMUSCULAR | Status: AC
  Administered 2019-02-10: 100 ug via INTRAVENOUS

## 2019-02-09 MED ORDER — FENTANYL CITRATE (PF) 100 MCG/2ML IJ SOLN: 100.0000 ug | Freq: Once | INTRAMUSCULAR | Status: DC

## 2019-02-09 MED ORDER — METHOCARBAMOL 1000 MG/10ML IJ SOLN
1000.0000 mg | Freq: Once | INTRAVENOUS | Status: AC
  Administered 2019-02-09: 1000 mg via INTRAVENOUS
  Filled 2019-02-09: qty 10

## 2019-02-09 MED ORDER — FENTANYL CITRATE (PF) 100 MCG/2ML IJ SOLN
100.0000 ug | Freq: Once | INTRAMUSCULAR | Status: AC
  Administered 2019-02-09: 100 ug via INTRAVENOUS
  Filled 2019-02-09: qty 2

## 2019-02-09 MED ORDER — DIAZEPAM 5 MG PO TABS
5.0000 mg | ORAL_TABLET | Freq: Once | ORAL | Status: AC
  Administered 2019-02-09: 5 mg via ORAL
  Filled 2019-02-09: qty 1

## 2019-02-09 MED ORDER — HYDROMORPHONE HCL 1 MG/ML IJ SOLN
1.0000 mg | Freq: Once | INTRAMUSCULAR | Status: DC
  Filled 2019-02-09: qty 1

## 2019-02-09 MED ORDER — METHOCARBAMOL 1000 MG/10ML IJ SOLN: 1000.0000 mg | Freq: Once | INTRAMUSCULAR | Status: DC

## 2019-02-09 MED ORDER — IOHEXOL 300 MG/ML  SOLN
100.0000 mL | Freq: Once | INTRAMUSCULAR | Status: AC | PRN
  Administered 2019-02-09: 100 mL via INTRAVENOUS

## 2019-02-09 MED ORDER — OXYCODONE-ACETAMINOPHEN 5-325 MG PO TABS
1.0000 | ORAL_TABLET | ORAL | Status: DC | PRN
  Administered 2019-02-09: 1 via ORAL
  Filled 2019-02-09: qty 1

## 2019-02-09 NOTE — ED Triage Notes (Signed)
Pain medication given in Triage. Patient advised about side effects of medications and  to avoid driving for a minimum of 4 hours.  Pt verbalized understanding.

## 2019-02-09 NOTE — ED Notes (Signed)
RN informed Pt can receive 2 visitors 

## 2019-02-09 NOTE — ED Triage Notes (Signed)
Pt arrives via POV from work where pt was standing beside a conveyor belt. A piece of equipment got hold of the pt and crushed his shoulder and part of his head. Denies any LOC. Pt states the machine folded him in half. Endorses back pain and some leg pain.

## 2019-02-10 ENCOUNTER — Encounter (HOSPITAL_COMMUNITY): Payer: Self-pay | Admitting: General Practice

## 2019-02-10 ENCOUNTER — Inpatient Hospital Stay (HOSPITAL_COMMUNITY): Payer: No Typology Code available for payment source

## 2019-02-10 ENCOUNTER — Other Ambulatory Visit: Payer: Self-pay

## 2019-02-10 DIAGNOSIS — M549 Dorsalgia, unspecified: Secondary | ICD-10-CM | POA: Diagnosis present

## 2019-02-10 DIAGNOSIS — S22071A Stable burst fracture of T9-T10 vertebra, initial encounter for closed fracture: Secondary | ICD-10-CM | POA: Diagnosis present

## 2019-02-10 DIAGNOSIS — S32019A Unspecified fracture of first lumbar vertebra, initial encounter for closed fracture: Secondary | ICD-10-CM | POA: Diagnosis present

## 2019-02-10 DIAGNOSIS — Z7984 Long term (current) use of oral hypoglycemic drugs: Secondary | ICD-10-CM | POA: Diagnosis not present

## 2019-02-10 DIAGNOSIS — Y990 Civilian activity done for income or pay: Secondary | ICD-10-CM | POA: Diagnosis not present

## 2019-02-10 DIAGNOSIS — S32029A Unspecified fracture of second lumbar vertebra, initial encounter for closed fracture: Secondary | ICD-10-CM | POA: Diagnosis present

## 2019-02-10 DIAGNOSIS — S22089A Unspecified fracture of T11-T12 vertebra, initial encounter for closed fracture: Secondary | ICD-10-CM | POA: Diagnosis present

## 2019-02-10 DIAGNOSIS — X58XXXA Exposure to other specified factors, initial encounter: Secondary | ICD-10-CM

## 2019-02-10 DIAGNOSIS — S32039A Unspecified fracture of third lumbar vertebra, initial encounter for closed fracture: Secondary | ICD-10-CM | POA: Diagnosis present

## 2019-02-10 DIAGNOSIS — M4856XA Collapsed vertebra, not elsewhere classified, lumbar region, initial encounter for fracture: Secondary | ICD-10-CM | POA: Diagnosis present

## 2019-02-10 DIAGNOSIS — Y9269 Other specified industrial and construction area as the place of occurrence of the external cause: Secondary | ICD-10-CM | POA: Diagnosis not present

## 2019-02-10 DIAGNOSIS — E785 Hyperlipidemia, unspecified: Secondary | ICD-10-CM | POA: Diagnosis present

## 2019-02-10 DIAGNOSIS — E119 Type 2 diabetes mellitus without complications: Secondary | ICD-10-CM | POA: Diagnosis present

## 2019-02-10 DIAGNOSIS — S22059A Unspecified fracture of T5-T6 vertebra, initial encounter for closed fracture: Secondary | ICD-10-CM | POA: Diagnosis present

## 2019-02-10 DIAGNOSIS — I10 Essential (primary) hypertension: Secondary | ICD-10-CM | POA: Diagnosis present

## 2019-02-10 DIAGNOSIS — S22081A Stable burst fracture of T11-T12 vertebra, initial encounter for closed fracture: Secondary | ICD-10-CM | POA: Diagnosis present

## 2019-02-10 DIAGNOSIS — S81812A Laceration without foreign body, left lower leg, initial encounter: Secondary | ICD-10-CM | POA: Diagnosis present

## 2019-02-10 DIAGNOSIS — Y9389 Activity, other specified: Secondary | ICD-10-CM | POA: Diagnosis not present

## 2019-02-10 DIAGNOSIS — G4700 Insomnia, unspecified: Secondary | ICD-10-CM | POA: Diagnosis present

## 2019-02-10 DIAGNOSIS — Z79899 Other long term (current) drug therapy: Secondary | ICD-10-CM | POA: Diagnosis not present

## 2019-02-10 DIAGNOSIS — S280XXA Crushed chest, initial encounter: Secondary | ICD-10-CM | POA: Diagnosis present

## 2019-02-10 DIAGNOSIS — R1110 Vomiting, unspecified: Secondary | ICD-10-CM | POA: Diagnosis not present

## 2019-02-10 DIAGNOSIS — K219 Gastro-esophageal reflux disease without esophagitis: Secondary | ICD-10-CM | POA: Diagnosis present

## 2019-02-10 DIAGNOSIS — S381XXA Crushing injury of abdomen, lower back, and pelvis, initial encounter: Secondary | ICD-10-CM | POA: Diagnosis present

## 2019-02-10 DIAGNOSIS — S2241XA Multiple fractures of ribs, right side, initial encounter for closed fracture: Secondary | ICD-10-CM | POA: Diagnosis present

## 2019-02-10 DIAGNOSIS — S270XXA Traumatic pneumothorax, initial encounter: Secondary | ICD-10-CM | POA: Diagnosis present

## 2019-02-10 DIAGNOSIS — M4854XA Collapsed vertebra, not elsewhere classified, thoracic region, initial encounter for fracture: Secondary | ICD-10-CM | POA: Diagnosis present

## 2019-02-10 DIAGNOSIS — Z7722 Contact with and (suspected) exposure to environmental tobacco smoke (acute) (chronic): Secondary | ICD-10-CM | POA: Diagnosis present

## 2019-02-10 DIAGNOSIS — S32049A Unspecified fracture of fourth lumbar vertebra, initial encounter for closed fracture: Secondary | ICD-10-CM | POA: Diagnosis present

## 2019-02-10 DIAGNOSIS — W230XXA Caught, crushed, jammed, or pinched between moving objects, initial encounter: Secondary | ICD-10-CM | POA: Diagnosis present

## 2019-02-10 LAB — BASIC METABOLIC PANEL
Anion gap: 12 (ref 5–15)
BUN: 12 mg/dL (ref 6–20)
CO2: 23 mmol/L (ref 22–32)
CREATININE: 1.04 mg/dL (ref 0.61–1.24)
Calcium: 9 mg/dL (ref 8.9–10.3)
Chloride: 100 mmol/L (ref 98–111)
GFR calc Af Amer: >60 mL/min (ref >60)
GFR calc non Af Amer: >60 mL/min (ref >60)
Glucose, Bld: 128 mg/dL — ABNORMAL HIGH (ref 70–99)
Potassium: 4 mmol/L (ref 3.5–5.1)
Sodium: 135 mmol/L (ref 135–145)

## 2019-02-10 LAB — GLUCOSE, CAPILLARY
GLUCOSE-CAPILLARY: 164 mg/dL — AB (ref 70–99)
Glucose-Capillary: 166 mg/dL — ABNORMAL HIGH (ref 70–99)
Glucose-Capillary: 152 mg/dL — ABNORMAL HIGH (ref 70–99)
Glucose-Capillary: 97 mg/dL (ref 70–99)

## 2019-02-10 LAB — CBC
HCT: 38.3 % — ABNORMAL LOW (ref 39.0–52.0)
Hemoglobin: 13 g/dL (ref 13.0–17.0)
MCH: 30.4 pg (ref 26.0–34.0)
MCHC: 33.9 g/dL (ref 30.0–36.0)
MCV: 89.5 fL (ref 80.0–100.0)
PLATELETS: 194 10*3/uL (ref 150–400)
RBC: 4.28 MIL/uL (ref 4.22–5.81)
RDW: 12.1 % (ref 11.5–15.5)
WBC: 6.4 10*3/uL (ref 4.0–10.5)
nRBC: 0 % (ref 0.0–0.2)

## 2019-02-10 LAB — MRSA PCR SCREENING: MRSA by PCR: NEGATIVE

## 2019-02-10 LAB — HIV ANTIBODY (ROUTINE TESTING W REFLEX): HIV Screen 4th Generation wRfx: NONREACTIVE

## 2019-02-10 MED ORDER — OXYCODONE HCL 5 MG PO TABS
5.0000 mg | ORAL_TABLET | ORAL | Status: DC | PRN
  Administered 2019-02-10 – 2019-02-13 (×12): 10 mg via ORAL
  Filled 2019-02-10 (×12): qty 2

## 2019-02-10 MED ORDER — METHOCARBAMOL 1000 MG/10ML IJ SOLN
1000.0000 mg | Freq: Three times a day (TID) | INTRAVENOUS | Status: DC | PRN
  Administered 2019-02-10: 1000 mg via INTRAVENOUS
  Filled 2019-02-10 (×2): qty 10

## 2019-02-10 MED ORDER — LACTATED RINGERS IV SOLN: INTRAVENOUS | Status: AC

## 2019-02-10 MED ORDER — FENTANYL CITRATE (PF) 100 MCG/2ML IJ SOLN
INTRAMUSCULAR | Status: AC
  Filled 2019-02-10: qty 2

## 2019-02-10 MED ORDER — ONDANSETRON HCL 4 MG/2ML IJ SOLN: 4.0000 mg | Freq: Four times a day (QID) | INTRAMUSCULAR | Status: DC | PRN

## 2019-02-10 MED ORDER — ORAL CARE MOUTH RINSE
15.0000 mL | Freq: Two times a day (BID) | OROMUCOSAL | Status: DC
  Administered 2019-02-10 – 2019-02-12 (×6): 15 mL via OROMUCOSAL

## 2019-02-10 MED ORDER — SODIUM CHLORIDE 0.9% FLUSH
9.0000 mL | INTRAVENOUS | Status: DC | PRN
  Administered 2019-02-12: 9 mL via INTRAVENOUS
  Filled 2019-02-10: qty 9

## 2019-02-10 MED ORDER — HYDROMORPHONE 1 MG/ML IV SOLN
INTRAVENOUS | Status: DC
  Administered 2019-02-10: 3.8 mg via INTRAVENOUS
  Administered 2019-02-10: 3.6 mg via INTRAVENOUS
  Administered 2019-02-10: 25 mg via INTRAVENOUS
  Filled 2019-02-10: qty 25

## 2019-02-10 MED ORDER — DIPHENHYDRAMINE HCL 12.5 MG/5ML PO ELIX: 12.5000 mg | ORAL_SOLUTION | Freq: Four times a day (QID) | ORAL | Status: DC | PRN

## 2019-02-10 MED ORDER — LOSARTAN POTASSIUM 50 MG PO TABS
100.0000 mg | ORAL_TABLET | Freq: Every day | ORAL | Status: DC
  Administered 2019-02-10 – 2019-02-13 (×4): 100 mg via ORAL
  Filled 2019-02-10 (×4): qty 2

## 2019-02-10 MED ORDER — ZOLPIDEM TARTRATE 5 MG PO TABS
5.0000 mg | ORAL_TABLET | Freq: Every day | ORAL | Status: DC
  Administered 2019-02-10 – 2019-02-12 (×3): 5 mg via ORAL
  Filled 2019-02-10 (×3): qty 1

## 2019-02-10 MED ORDER — ENOXAPARIN SODIUM 40 MG/0.4ML ~~LOC~~ SOLN
40.0000 mg | SUBCUTANEOUS | Status: DC
  Administered 2019-02-10 – 2019-02-12 (×3): 40 mg via SUBCUTANEOUS
  Filled 2019-02-10 (×3): qty 0.4

## 2019-02-10 MED ORDER — PANTOPRAZOLE SODIUM 40 MG PO TBEC
40.0000 mg | DELAYED_RELEASE_TABLET | Freq: Every day | ORAL | Status: DC
  Administered 2019-02-11 – 2019-02-13 (×3): 40 mg via ORAL
  Filled 2019-02-10 (×4): qty 1

## 2019-02-10 MED ORDER — SODIUM CHLORIDE 0.9 % IV SOLN
INTRAVENOUS | Status: DC | PRN
  Administered 2019-02-10: 250 mL via INTRAVENOUS

## 2019-02-10 MED ORDER — HYDROMORPHONE HCL 1 MG/ML IJ SOLN
1.0000 mg | INTRAMUSCULAR | Status: DC | PRN
  Administered 2019-02-10 – 2019-02-13 (×4): 1 mg via INTRAVENOUS
  Filled 2019-02-10 (×4): qty 1

## 2019-02-10 MED ORDER — DIPHENHYDRAMINE HCL 50 MG/ML IJ SOLN: 12.5000 mg | Freq: Four times a day (QID) | INTRAMUSCULAR | Status: DC | PRN

## 2019-02-10 MED ORDER — DEXTROSE-NACL 5-0.9 % IV SOLN
INTRAVENOUS | Status: DC
  Administered 2019-02-10 (×2): via INTRAVENOUS

## 2019-02-10 MED ORDER — FENTANYL CITRATE (PF) 100 MCG/2ML IJ SOLN
100.0000 ug | INTRAMUSCULAR | Status: AC | PRN
  Administered 2019-02-10 (×2): 100 ug via INTRAVENOUS
  Filled 2019-02-10 (×2): qty 2

## 2019-02-10 MED ORDER — NALOXONE HCL 0.4 MG/ML IJ SOLN: 0.4000 mg | INTRAMUSCULAR | Status: DC | PRN

## 2019-02-10 MED ORDER — INSULIN ASPART 100 UNIT/ML ~~LOC~~ SOLN
0.0000 [IU] | Freq: Three times a day (TID) | SUBCUTANEOUS | Status: DC
  Administered 2019-02-10 – 2019-02-11 (×3): 3 [IU] via SUBCUTANEOUS
  Administered 2019-02-11 – 2019-02-13 (×4): 2 [IU] via SUBCUTANEOUS

## 2019-02-10 MED ORDER — CARVEDILOL 12.5 MG PO TABS
12.5000 mg | ORAL_TABLET | Freq: Two times a day (BID) | ORAL | Status: DC
  Administered 2019-02-10 – 2019-02-13 (×7): 12.5 mg via ORAL
  Filled 2019-02-10 (×7): qty 1

## 2019-02-10 MED ORDER — ACETAMINOPHEN 325 MG PO TABS
650.0000 mg | ORAL_TABLET | Freq: Four times a day (QID) | ORAL | Status: DC
  Administered 2019-02-10 – 2019-02-11 (×4): 650 mg via ORAL
  Filled 2019-02-10 (×4): qty 2

## 2019-02-10 MED ORDER — SODIUM CHLORIDE 0.9 % IV BOLUS
1000.0000 mL | Freq: Once | INTRAVENOUS | Status: AC
  Administered 2019-02-10: 1000 mL via INTRAVENOUS

## 2019-02-10 NOTE — Consult Note (Signed)
Reason for Consult: Spine fractures Referring Physician: Trauma  Daniel [name] is an 53 y.o. male.  HPI: The patient is a 53 year old black male who was involved in a work-related injury last night.  He was worked up with a CT of the chest abdomen pelvis which demonstrated transverse process fractures and a age-indeterminate determinate T6 and L4 fracture.  A neurosurgical consultation was requested.  Presently the patient is alert and pleasant.  He complains of back pain from the intrascapular region down to his sacrum.  He has no radicular symptoms, numbness, tingling, weakness.  He denies neck pain.  Past Medical History:  Diagnosis Date  . Asthma   . Depression    h/o  . Diabetes mellitus   . GERD (gastroesophageal reflux disease)   . H/O Clostridium difficile infection 08/2015  . Hyperlipidemia   . Hypertension   . Insomnia   . Polyarthralgia 2009   blood work (-) CKs slightly elevated, bone san (-) saw rheumatology; continue w/ somptoms after holding zocor    Past Surgical History:  Procedure Laterality Date  . HAND SURGERY Left 2006  . HEMORRHOID SURGERY    . HERNIA REPAIR  16/1096   umbilical  . KNEE SURGERY Right 2011   . NASAL FRACTURE SURGERY  2006    Family History  Problem Relation Age of Onset  . Diabetes Mother        ??  . Hypertension Mother   . Diabetes Father   . Prostate cancer Neg Hx   . Colon cancer Neg Hx   . Coronary artery disease Neg Hx   . Stroke Neg Hx     Social History:  reports that he is a non-smoker but has been exposed to tobacco smoke. He has never used smokeless tobacco. He reports current alcohol use of about 5.0 standard drinks of alcohol per week. He reports that he does not use drugs.  Allergies:  Allergies  Allergen Reactions  . Levofloxacin Other (See Comments)    Tendon rupture at wrist    Medications:  I have reviewed the patient's current medications. Prior to Admission:  Medications Prior to Admission  Medication  Sig Dispense Refill Last Dose  . aspirin 81 MG tablet Take 81 mg by mouth daily.   02/09/2019 at Unknown time  . carvedilol (COREG) 12.5 MG tablet Take 1 tablet (12.5 mg total) by mouth 2 (two) times daily with a meal. 180 tablet 1 02/09/2019 at 0800  . fish oil-omega-3 fatty acids 1000 MG capsule Take 1 g by mouth daily.     02/09/2019 at Unknown time  . Flaxseed, Linseed, (FLAX SEED OIL PO) Take 1 each by mouth at bedtime.     02/08/2019 at Unknown time  . losartan (COZAAR) 100 MG tablet Take 1 tablet (100 mg total) by mouth daily. 90 tablet 2 02/09/2019 at Unknown time  . metFORMIN (GLUCOPHAGE) 850 MG tablet Take 1 tablet (850 mg total) by mouth 2 (two) times daily with a meal. 60 tablet 6 02/09/2019 at Unknown time  . niacin 500 MG tablet Take 500 mg by mouth at bedtime.   02/09/2019 at Unknown time  . pantoprazole (PROTONIX) 40 MG tablet Take 1 tablet (40 mg total) by mouth daily before breakfast. 90 tablet 3 Past Week at Unknown time  . Probiotic Product (PROBIOTIC DAILY PO) Take 1 tablet by mouth daily.    02/09/2019 at Unknown time  . Red Yeast Rice 600 MG CAPS Take 600 mg by mouth daily.  02/09/2019 at Unknown time  . zolpidem (AMBIEN CR) 12.5 MG CR tablet TAKE 1 TABLET(12.5 MG) BY MOUTH AT BEDTIME AS NEEDED FOR SLEEP (Patient taking differently: Take 12.5 mg by mouth at bedtime as needed for sleep. ) 30 tablet 1 02/08/2019 at Unknown time  . clonazePAM (KLONOPIN) 0.5 MG tablet take 1 tablet by mouth at bedtime if needed for anxiety (Patient not taking: Reported on 01/09/2019) 30 tablet 0 Not Taking at Unknown time  . cyclobenzaprine (FLEXERIL) 10 MG tablet Take 1 tablet (10 mg total) by mouth at bedtime as needed for muscle spasms. (Patient not taking: Reported on 02/10/2019) 30 tablet 1 Completed Course at Unknown time  . Multiple Vitamins-Minerals (MULTIVITAMIN,TX-MINERALS) tablet Take 1 tablet by mouth daily.     Not Taking at Unknown time  . ONE TOUCH ULTRA TEST test strip TEST three times a day 90  each 12 Taking  . ONETOUCH DELICA LANCETS 27N MISC CHECK BLOOD SUGAR NO MORE THAN 3 TIMES A DAY 100 each 12 Taking  . valACYclovir (VALTREX) 500 MG tablet Take 1 tablet (500 mg total) by mouth daily. (Patient not taking: Reported on 02/10/2019) 90 tablet 3 Completed Course at Unknown time   Scheduled: . carvedilol  12.5 mg Oral BID WC  . enoxaparin (LOVENOX) injection  40 mg Subcutaneous Q24H  . HYDROmorphone   Intravenous Q4H  . insulin aspart  0-15 Units Subcutaneous TID WC  . mouth rinse  15 mL Mouth Rinse BID  . pantoprazole  40 mg Oral QAC breakfast  . zolpidem  5 mg Oral QHS   Continuous: . dextrose 5 % and 0.9% NaCl 75 mL/hr at 02/10/19 0608  . lactated ringers     [id number] **OR** diphenhydrAMINE, naloxone **AND** sodium chloride flush, ondansetron (ZOFRAN) IV, oxyCODONE-acetaminophen Anti-infectives (From admission, onward)   None       Results for orders placed or performed during the hospital encounter of 02/09/19 (from the past 48 hour(s))  CBC     Status: Abnormal   Collection Time: 02/09/19  9:00 PM  Result Value Ref Range   WBC 13.6 (H) 4.0 - 10.5 K/uL   RBC 4.97 4.22 - 5.81 MIL/uL   Hemoglobin 15.4 13.0 - 17.0 g/dL   HCT 43.2 39.0 - 52.0 %   MCV 86.9 80.0 - 100.0 fL   MCH 31.0 26.0 - 34.0 pg   MCHC 35.6 30.0 - 36.0 g/dL   RDW 13.4 11.5 - 15.5 %   Platelets 134 (L) 150 - 400 K/uL   nRBC 0.0 0.0 - 0.2 %    Comment: Performed at [facility], [address] [place], [place] [address]  Comprehensive metabolic panel     Status: Abnormal   Collection Time: 02/09/19  9:00 PM  Result Value Ref Range   Sodium 136 135 - 145 mmol/L   Potassium 4.7 3.5 - 5.1 mmol/L   Chloride 101 98 - 111 mmol/L   CO2 24 22 - 32 mmol/L   Glucose, Bld 120 (H) 70 - 99 mg/dL   BUN 15 6 - 20 mg/dL   Creatinine, Ser 0.98 0.61 - 1.24 mg/dL   Calcium 9.5 8.9 - 10.3 mg/dL   Total Protein 7.2 6.5 - 8.1 g/dL   Albumin 4.6 3.5 - 5.0 g/dL   AST 74 (H) 15 - 41 U/L   ALT 70  (H) 0 - 44 U/L   Alkaline Phosphatase 79 38 - 126 U/L   Total Bilirubin 1.1 0.3 - 1.2 mg/dL  GFR calc non Af Amer >60 >60 mL/min   GFR calc Af Amer >60 >60 mL/min   Anion gap 11 5 - 15    Comment: Performed at [facility] [address] [place], [place] [address]  CBC     Status: Abnormal   Collection Time: 02/10/19  3:32 AM  Result Value Ref Range   WBC 6.4 4.0 - 10.5 K/uL   RBC 4.28 4.22 - 5.81 MIL/uL   Hemoglobin 13.0 13.0 - 17.0 g/dL   HCT 38.3 (L) 39.0 - 52.0 %   MCV 89.5 80.0 - 100.0 fL   MCH 30.4 26.0 - 34.0 pg   MCHC 33.9 30.0 - 36.0 g/dL   RDW 12.1 11.5 - 15.5 %   Platelets 194 150 - 400 K/uL   nRBC 0.0 0.0 - 0.2 %    Comment: Performed at [facility], [place] [address] [place], [place] [address]  Basic metabolic panel     Status: Abnormal   Collection Time: 02/10/19  3:32 AM  Result Value Ref Range   Sodium 135 135 - 145 mmol/L   Potassium 4.0 3.5 - 5.1 mmol/L   Chloride 100 98 - 111 mmol/L   CO2 23 22 - 32 mmol/L   Glucose, Bld 128 (H) 70 - 99 mg/dL   BUN 12 6 - 20 mg/dL   Creatinine, Ser 1.04 0.61 - 1.24 mg/dL   Calcium 9.0 8.9 - 10.3 mg/dL   GFR calc non Af Amer >60 >60 mL/min   GFR calc Af Amer >60 >60 mL/min   Anion gap 12 5 - 15    Comment: Performed at [facility], [place] [address], [place], [place] [address]  MRSA PCR Screening     Status: None   Collection Time: 02/10/19  5:09 AM  Result Value Ref Range   MRSA by PCR NEGATIVE NEGATIVE    Comment:        The GeneXpert MRSA Assay (FDA approved for NASAL specimens only), is one component of a comprehensive MRSA colonization surveillance program. It is not intended to diagnose MRSA infection nor to guide or monitor treatment for MRSA infections. Performed at [facility], [place] [address], [place], [place] [address]   Glucose, capillary     Status: Abnormal   Collection Time: 02/10/19  5:50 AM  Result Value Ref Range   Glucose-Capillary 164 (H) 70 - 99 mg/dL    Dg Chest  1 View  Result Date: 02/10/2019 CLINICAL DATA:  Chest, rib, and back pain after crush injury. EXAM: CHEST  1 VIEW COMPARISON:  01/11/2016 FINDINGS: Shallow inspiration. Mild cardiac enlargement. No vascular congestion, edema, or consolidation. No blunting of costophrenic angles. No pneumothorax. Mediastinal contours appear intact. Degenerative changes in the spine. IMPRESSION: No active disease. Electronically Signed   By: Lucienne Capers M.D.   On: 02/10/2019 00:21   Dg Cervical Spine 2 Or 3 Views  Result Date: 02/09/2019 CLINICAL DATA:  Initial evaluation for acute back pain status post trauma, crush injury. EXAM: CERVICAL SPINE - 2-3 VIEW COMPARISON:  None. FINDINGS: Mild straightening of the normal cervical lordosis. No listhesis or subluxation. Vertebral body heights maintained with no acute or chronic fracture identified. Normal C1-2 articulations are preserved in the dens is intact. No prevertebral soft tissue swelling. Bulky anterior endplate osteophytic spurring seen throughout the cervical spine. Visualized soft tissues of the neck demonstrate no acute finding. Partially visualized lung apices are grossly clear. IMPRESSION: 1. No radiographic  evidence for acute traumatic injury within the cervical spine. 2. Prominent anterior bulky endplate osteophytic spurring throughout the cervical spine. Electronically Signed   By: Jeannine Boga M.D.   On: 02/09/2019 18:23   Dg Thoracic Spine 2 View  Result Date: 02/09/2019 CLINICAL DATA:  Initial evaluation for acute back pain status post trauma, crush injury. EXAM: THORACIC SPINE 2 VIEWS COMPARISON:  None. FINDINGS: Vertebral bodies normally aligned with preservation of the normal thoracic kyphosis. No listhesis or malalignment. Mild height loss with associated Schmorl's node at the superior endplate of T6 is chronic and likely degenerative in nature. Mild scattered endplate osteophytic spurring. No appreciable soft tissue abnormality. IMPRESSION:  No radiographic evidence for acute traumatic injury within the thoracic spine. Electronically Signed   By: Jeannine Boga M.D.   On: 02/09/2019 18:26   Dg Lumbar Spine Complete  Result Date: 02/09/2019 CLINICAL DATA:  Initial evaluation for acute back pain status post trauma, crush injury. EXAM: LUMBAR SPINE - COMPLETE 4+ VIEW COMPARISON:  None. FINDINGS: 5 lumbar type non-rib-bearing vertebral bodies. Normal alignment with preservation of the normal lumbar lordosis. No listhesis. Compression deformity involving the superior endplate of L4 with mild 20% height loss without bony retropulsion, suspected to be acute in nature. Vertebral body heights otherwise maintained. Visualized sacrum and pelvis intact. SI joints approximated. Anterior endplate osteophytic spurring seen at L2-3 through L4-5. No acute soft tissue abnormality. IMPRESSION: Compression deformity involving the superior endplate of L4 with up to 20% height loss without bony retropulsion. While this finding is somewhat age indeterminate, this is favored to be acute in nature. Correlation with physical exam for possible pain at this location recommended. Electronically Signed   By: Jeannine Boga M.D.   On: 02/09/2019 18:30   Ct Chest W Contrast  Result Date: 02/09/2019 CLINICAL DATA:  53 year old male with blunt trauma to the chest. Patient complaining of lower back pain. EXAM: CT CHEST, ABDOMEN, AND PELVIS WITH CONTRAST TECHNIQUE: Multidetector CT imaging of the chest, abdomen and pelvis was performed following the standard protocol during bolus administration of intravenous contrast. CONTRAST:  121mL OMNIPAQUE IOHEXOL 300 MG/ML  SOLN COMPARISON:  Lumbar spine CT dated 02/09/2019 and chest radiograph dated 02/09/2019 FINDINGS: CT CHEST FINDINGS Cardiovascular: Top-normal cardiac size. No pericardial effusion. The thoracic aorta is unremarkable. The visualized origins of the great vessels of the aortic arch appear patent. The central  pulmonary arteries are patent as visualized. Mediastinum/Nodes: There is no hilar or mediastinal adenopathy. The esophagus and the thyroid gland are grossly unremarkable. No mediastinal fluid collection. Lungs/Pleura: There is a small right pneumothorax (less than 20%). There is no focal consolidation, or pleural effusion. The central airways are patent. Musculoskeletal: Multiple nondisplaced posterior right rib fractures involving the seventh-ninth ribs. There is minimal age indeterminate compression fracture of the superior endplate of T6, likely chronic. Clinical correlation is recommended. No retropulsed fragment. Minimally displaced fracture of the right transverse process of T12 at the costovertebral junction. Nondisplaced linear fracture of the right transverse process of T10 and T11. CT ABDOMEN PELVIS FINDINGS No intra-abdominal free air or free fluid. Hepatobiliary: No focal liver abnormality is seen. No gallstones, gallbladder wall thickening, or biliary dilatation. Pancreas: Unremarkable. No pancreatic ductal dilatation or surrounding inflammatory changes. Spleen: Normal in size without focal abnormality. Adrenals/Urinary Tract: Adrenal glands are unremarkable. Kidneys are normal, without renal calculi, focal lesion, or hydronephrosis. Bladder is unremarkable. Stomach/Bowel: There is distal colonic diverticulosis without active inflammatory changes. There is no bowel obstruction or active inflammation. Normal appendix.  Vascular/Lymphatic: No significant vascular findings are present. No enlarged abdominal or pelvic lymph nodes. Reproductive: The prostate and seminal vesicles are grossly unremarkable. Other: Thickening and nodularity of the subcutaneous soft tissues at the umbilicus likely postsurgical scarring. No fluid collection. Musculoskeletal: Age indeterminate fracture of the superior endplate of L4 with approximately 30% loss of vertebral body height. Correlation with clinical exam and point  tenderness recommended. Mildly displaced fractures of the right transverse processes of the L1-L4. IMPRESSION: 1. Small right pneumothorax. 2. Multiple nondisplaced right posterior rib fractures as well as nondisplaced right thoracic transverse process fractures. Multilevel displaced lumbar spine right transverse process fractures. 3. Age indeterminate mild compression fractures of T6 and L4. Correlation with clinical exam and point tenderness recommended. 4. No acute/traumatic intra-abdominal or pelvic solid organ or hollow viscus injury. These results were called by telephone at the time of interpretation on 02/09/2019 at 10:44 pm to Dr. Varney Biles , who verbally acknowledged these results. Electronically Signed   By: Anner Crete M.D.   On: 02/09/2019 22:48   Ct Abdomen Pelvis W Contrast  Result Date: 02/09/2019 CLINICAL DATA:  53 year old male with blunt trauma to the chest. Patient complaining of lower back pain. EXAM: CT CHEST, ABDOMEN, AND PELVIS WITH CONTRAST TECHNIQUE: Multidetector CT imaging of the chest, abdomen and pelvis was performed following the standard protocol during bolus administration of intravenous contrast. CONTRAST:  152mL OMNIPAQUE IOHEXOL 300 MG/ML  SOLN COMPARISON:  Lumbar spine CT dated 02/09/2019 and chest radiograph dated 02/09/2019 FINDINGS: CT CHEST FINDINGS Cardiovascular: Top-normal cardiac size. No pericardial effusion. The thoracic aorta is unremarkable. The visualized origins of the great vessels of the aortic arch appear patent. The central pulmonary arteries are patent as visualized. Mediastinum/Nodes: There is no hilar or mediastinal adenopathy. The esophagus and the thyroid gland are grossly unremarkable. No mediastinal fluid collection. Lungs/Pleura: There is a small right pneumothorax (less than 20%). There is no focal consolidation, or pleural effusion. The central airways are patent. Musculoskeletal: Multiple nondisplaced posterior right rib fractures  involving the seventh-ninth ribs. There is minimal age indeterminate compression fracture of the superior endplate of T6, likely chronic. Clinical correlation is recommended. No retropulsed fragment. Minimally displaced fracture of the right transverse process of T12 at the costovertebral junction. Nondisplaced linear fracture of the right transverse process of T10 and T11. CT ABDOMEN PELVIS FINDINGS No intra-abdominal free air or free fluid. Hepatobiliary: No focal liver abnormality is seen. No gallstones, gallbladder wall thickening, or biliary dilatation. Pancreas: Unremarkable. No pancreatic ductal dilatation or surrounding inflammatory changes. Spleen: Normal in size without focal abnormality. Adrenals/Urinary Tract: Adrenal glands are unremarkable. Kidneys are normal, without renal calculi, focal lesion, or hydronephrosis. Bladder is unremarkable. Stomach/Bowel: There is distal colonic diverticulosis without active inflammatory changes. There is no bowel obstruction or active inflammation. Normal appendix. Vascular/Lymphatic: No significant vascular findings are present. No enlarged abdominal or pelvic lymph nodes. Reproductive: The prostate and seminal vesicles are grossly unremarkable. Other: Thickening and nodularity of the subcutaneous soft tissues at the umbilicus likely postsurgical scarring. No fluid collection. Musculoskeletal: Age indeterminate fracture of the superior endplate of L4 with approximately 30% loss of vertebral body height. Correlation with clinical exam and point tenderness recommended. Mildly displaced fractures of the right transverse processes of the L1-L4. IMPRESSION: 1. Small right pneumothorax. 2. Multiple nondisplaced right posterior rib fractures as well as nondisplaced right thoracic transverse process fractures. Multilevel displaced lumbar spine right transverse process fractures. 3. Age indeterminate mild compression fractures of T6 and L4. Correlation with clinical  exam and  point tenderness recommended. 4. No acute/traumatic intra-abdominal or pelvic solid organ or hollow viscus injury. These results were called by telephone at the time of interpretation on 02/09/2019 at 10:44 pm to Dr. Varney Biles , who verbally acknowledged these results. Electronically Signed   By: Anner Crete M.D.   On: 02/09/2019 22:48   Ct L-spine No Charge  Result Date: 02/09/2019 CLINICAL DATA:  53 year old male with trauma. EXAM: CT LUMBAR SPINE WITHOUT CONTRAST TECHNIQUE: Multidetector CT imaging of the lumbar spine was performed without intravenous contrast administration. Multiplanar CT image reconstructions were also generated. COMPARISON:  CT of the abdomen pelvis dated 02/09/2019 FINDINGS: Segmentation: 5 lumbar type vertebrae. Alignment: Normal. Vertebrae: Age indeterminate compression fracture of the superior endplate of L4 with approximately 30% loss of vertebral body height. This is concerning for an acute fracture. Correlation with clinical exam and point tenderness recommended. No retropulsed fragment. Mildly displaced fractures of the right L1-L4 transverse processes. Paraspinal and other soft tissues: Small amount of hematoma in the right psoas muscle. Disc levels: No significant degenerative changes. IMPRESSION: 1. Age indeterminate, likely acute, compression fracture of the superior endplate of L4. Clinical correlation is recommended. 2. Mildly displaced fractures of the right L1-L4 transverse processes. Electronically Signed   By: Anner Crete M.D.   On: 02/09/2019 22:54   Dg Chest Port 1 View  Result Date: 02/10/2019 CLINICAL DATA:  Right-sided pneumothorax from a crush injury yesterday. EXAM: PORTABLE CHEST 1 VIEW COMPARISON:  Chest 02/09/2019. CT chest 02/09/2019 FINDINGS: Mild cardiac enlargement. No vascular congestion, edema, or consolidation. Right pneumothorax seen at CT is not visualized radiographically. No blunting of costophrenic angles. Mediastinal contours  appear intact. IMPRESSION: Cardiac enlargement. No evidence of active pulmonary disease. Right pneumothorax seen at CT is not visualized radiographically. Electronically Signed   By: Lucienne Capers M.D.   On: 02/10/2019 03:35   Dg Femur Min 2 Views Left  Result Date: 02/10/2019 CLINICAL DATA:  Left upper leg pain after being crushed by piece of machinery tonight at work. EXAM: LEFT FEMUR 2 VIEWS COMPARISON:  CT abdomen and pelvis 02/09/2019. FINDINGS: Degenerative changes in the left hip and left knee. No acute fracture or dislocation of the left hip. Left femur appears intact. Vascular calcifications. Residual contrast material in the bladder. IMPRESSION: No acute bony abnormalities. Degenerative changes in the hip and knee. Electronically Signed   By: Lucienne Capers M.D.   On: 02/10/2019 00:20    ROS: As above Blood pressure (!) 151/94, pulse 88, temperature 98.3 F (36.8 C), temperature source Oral, resp. rate 15, height 5\' 11"  (1.803 m), weight 95.3 kg, SpO2 95 %. Estimated body mass index is 29.29 kg/m as calculated from the following:   Height as of this encounter: 5\' 11"  (1.803 m).   Weight as of this encounter: 95.3 kg.  Physical Exam  General: An alert pleasant 53 year old obese black male complaining of soreness all over.  HEENT: Normocephalic, pupils equal round reactive light, extraocular muscles are intact,  Neck: Supple without masses or deformities.  He has a mildly decreased cervical range of motion.  Spurling's testing is negative.  Thorax: Symmetric  Abdomen: Obese and soft  Extremities: Unremarkable  Neurologic exam: The patient is alert and oriented x3.  Glasgow Coma Scale 15.  Cranial nerves II through XII were examined bilaterally and grossly normal.  Vision and hearing are grossly normal bilaterally.  Sensory function is intact to light touch sensation all tested dermatomes bilaterally.  Cerebellar function is intact  to rapid alternating movements of the upper  extremities bilaterally.  His motor strength is normal in his bilateral handgrip, bicep, tricep, gastrocnemius, and dorsiflexors.  I have reviewed the patient's CT of the chest abdomen and pelvis only as it pertains to his spine.  He has thoracic transverse process fractures and a chronic appearing T6 and L4 compression deformity.  There is no neural compression.  I reviewed the patient's cervical x-rays.  He has diffuse degenerative changes spondylosis but no acute changes down to C7.  Assessment/Plan: Thoracic transverse process fractures, mild T6 and L4 pressure fractures, possibly chronic, thoracic and lumbar spine pain: I have discussed the situation with the patient.  The transverse process fractures will heal without intervention.  I suspect the T6 and L4 fractures are chronic but they could be acute.  Either way they are not unstable.  I will order a TLSO for patient comfort.  He can be mobilized once he has a TLSO.  I have answered all his questions.  Please have him follow-up with me in the office PRN.  Please call if I can be of further assistance.  Ophelia Charter 02/10/2019, 7:42 AM

## 2019-02-10 NOTE — ED Provider Notes (Signed)
[facility] Provider Note   CSN: [id number] Arrival date & time: 02/09/19  1651    History   Chief Complaint Chief Complaint  Patient presents with  . Back Pain    HPI Daniel [name] is a 53 y.o. male.     HPI 53 year old male comes in with chief complaint of back pain.  Patient has history of hypertension, hyperlipidemia, diabetes.  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  While he was trying to fix the belt, the clamp of the heavy machinery that lifts the boxes came down and essentially crushed him.  Patient is complaining of pain between his shoulder blades and his entire lower back.  He is also complaining of pain in his right leg.  Patient's pain is worse with deep inspiration.  He denies any shortness of breath.  Patient does not take any blood thinners.   Past Medical History:  Diagnosis Date  . Asthma   . Depression    h/o  . Diabetes mellitus   . GERD (gastroesophageal reflux disease)   . H/O Clostridium difficile infection 08/2015  . Hyperlipidemia   . Hypertension   . Insomnia   . Polyarthralgia 2009   blood work (-) CKs slightly elevated, bone san (-) saw rheumatology; continue w/ somptoms after holding zocor    Patient Active Problem List   Diagnosis Date Noted  . Anxiety 02/24/2017  . HSV infection 02/24/2017  . H/O Clostridium difficile infection   . PCP NOTES >>>>> 09/12/2015  . Annual physical exam 05/02/2011  . DM II (diabetes mellitus, type II), controlled ([place]) 03/05/2011  . GERD 01/23/2011  . Hyperlipidemia 04/28/2008  . Asthma 08/26/2007  . Essential hypertension 02/09/2007  . INSOMNIA 02/09/2007    Past Surgical History:  Procedure Laterality Date  . HAND SURGERY Left 2006  . HEMORRHOID SURGERY    . HERNIA REPAIR  24/0973   umbilical  . KNEE SURGERY Right 2011   . NASAL FRACTURE SURGERY  2006        Home Medications    Prior to Admission medications     Medication Sig Start Date End Date Taking? Authorizing Provider  aspirin 81 MG tablet Take 81 mg by mouth daily.   Yes [provider]  carvedilol (COREG) 12.5 MG tablet Take 1 tablet (12.5 mg total) by mouth 2 (two) times daily with a meal. 12/18/18  Yes Paz, Alda Berthold, MD  fish oil-omega-3 fatty acids 1000 MG capsule Take 1 g by mouth daily.     Yes [provider]  Flaxseed, Linseed, (FLAX SEED OIL PO) Take 1 each by mouth at bedtime.     Yes [provider]  losartan (COZAAR) 100 MG tablet Take 1 tablet (100 mg total) by mouth daily. 11/20/18  Yes Colon Branch, MD  metFORMIN (GLUCOPHAGE) 850 MG tablet Take 1 tablet (850 mg total) by mouth 2 (two) times daily with a meal. 01/12/19  Yes Paz, Alda Berthold, MD  niacin 500 MG tablet Take 500 mg by mouth at bedtime.   Yes [provider]  pantoprazole (PROTONIX) 40 MG tablet Take 1 tablet (40 mg total) by mouth daily before breakfast. 01/09/19  Yes Paz, Alda Berthold, MD  Probiotic Product (PROBIOTIC DAILY PO) Take 1 tablet by mouth daily.    Yes [provider]  Red Yeast Rice 600 MG CAPS Take 600 mg by mouth daily.   Yes [provider]  zolpidem (AMBIEN CR) 12.5 MG CR tablet TAKE 1 TABLET(12.5 MG) BY MOUTH AT BEDTIME AS NEEDED FOR SLEEP Patient taking differently: Take 12.5 mg by mouth at bedtime as needed for sleep.  01/05/19  Yes Paz, Alda Berthold, MD  clonazePAM Bobbye [place]) 0.5 MG tablet take 1 tablet by mouth at bedtime if needed for anxiety Patient not taking: Reported on 01/09/2019 01/03/18   Colon Branch, MD  cyclobenzaprine (FLEXERIL) 10 MG tablet Take 1 tablet (10 mg total) by mouth at bedtime as needed for muscle spasms. Patient not taking: Reported on 02/10/2019 01/12/19   Colon Branch, MD  Multiple Vitamins-Minerals (MULTIVITAMIN,TX-MINERALS) tablet Take 1 tablet by mouth daily.      [provider]  ONE TOUCH ULTRA TEST test strip TEST three times a day 12/18/16   Colon Branch, MD  [facility] DELICA LANCETS  76L MISC CHECK BLOOD SUGAR NO MORE THAN 3 TIMES A DAY 11/27/17   Colon Branch, MD  valACYclovir (VALTREX) 500 MG tablet Take 1 tablet (500 mg total) by mouth daily. Patient not taking: Reported on 02/10/2019 11/20/18   Colon Branch, MD    Family History Family History  Problem Relation Age of Onset  . Diabetes Mother        ??  . Hypertension Mother   . Diabetes Father   . Prostate cancer Neg Hx   . Colon cancer Neg Hx   . Coronary artery disease Neg Hx   . Stroke Neg Hx     Social History Social History   Tobacco Use  . Smoking status: Passive Smoke Exposure - Never Smoker  . Smokeless tobacco: Never Used  . 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  Substance Use Topics  . Alcohol use: Yes    Alcohol/week: 5.0 standard drinks    Types: 5 Cans of beer per week  . Drug use: No     Allergies   Levofloxacin   Review of Systems Review of Systems  Constitutional: Positive for activity change.  Respiratory: Negative for shortness of breath.   Cardiovascular: Positive for chest pain.  Musculoskeletal: Positive for back pain.  Hematological: Does not bruise/bleed easily.  All other systems reviewed and are negative.    Physical Exam Updated Vital Signs BP (!) 161/96 (BP Location: Left Arm)   Pulse 95   Temp 98.3 F (36.8 C) (Oral)   Resp 18   Ht 5\' 11"  (1.803 m)   Wt 95.3 kg   SpO2 99%   BMI 29.29 kg/m   Physical Exam Vitals signs and nursing note reviewed.  Constitutional:      Appearance: He is well-developed.  HENT:     Head: Atraumatic.  Neck:     Musculoskeletal: Neck supple.     Comments: No midline c-spine tenderness, pt able to turn head to 45 degrees bilaterally without any pain and able to flex neck to the chest and extend without any pain or neurologic symptoms.  Cardiovascular:     Rate and Rhythm: Normal rate.  Pulmonary:     Effort: Pulmonary effort is normal. No respiratory distress.     Breath sounds:  No wheezing.  Abdominal:     Tenderness: There is no abdominal tenderness.  Musculoskeletal:        General: Tenderness present.     Left lower leg: Edema present.     Comments: Pt has significant tenderness over the lumbar spine and thoracic spine.   Skin:  General: Skin is warm.     Comments: Left lower extremity has multiple linear lacerations that are superficial.  2 of the lacerations do have a very minimal gaping and loss of skin.  Each of the lacerations about 5 to 8 cm in length.  No active bleeding.  Neurological:     Mental Status: He is alert and oriented to person, place, and time.     Sensory: No sensory deficit.     Motor: No weakness.     Comments: + patellar reflex      ED Treatments / Results  Labs (all labs ordered are listed, but only abnormal results are displayed) Labs Reviewed  CBC - Abnormal; Notable for the following components:      Result Value   WBC 13.6 (*)    Platelets 134 (*)    All other components within normal limits  COMPREHENSIVE METABOLIC PANEL - Abnormal; Notable for the following components:   Glucose, Bld 120 (*)    AST 74 (*)    ALT 70 (*)    All other components within normal limits    EKG None  Radiology Dg Chest 1 View  Result Date: 02/10/2019 CLINICAL DATA:  Chest, rib, and back pain after crush injury. EXAM: CHEST  1 VIEW COMPARISON:  01/11/2016 FINDINGS: Shallow inspiration. Mild cardiac enlargement. No vascular congestion, edema, or consolidation. No blunting of costophrenic angles. No pneumothorax. Mediastinal contours appear intact. Degenerative changes in the spine. IMPRESSION: No active disease. Electronically Signed   By: Lucienne Capers M.D.   On: 02/10/2019 00:21   Dg Cervical Spine 2 Or 3 Views  Result Date: 02/09/2019 CLINICAL DATA:  Initial evaluation for acute back pain status post trauma, crush injury. EXAM: CERVICAL SPINE - 2-3 VIEW COMPARISON:  None. FINDINGS: Mild straightening of the normal cervical  lordosis. No listhesis or subluxation. Vertebral body heights maintained with no acute or chronic fracture identified. Normal C1-2 articulations are preserved in the dens is intact. No prevertebral soft tissue swelling. Bulky anterior endplate osteophytic spurring seen throughout the cervical spine. Visualized soft tissues of the neck demonstrate no acute finding. Partially visualized lung apices are grossly clear. IMPRESSION: 1. No radiographic evidence for acute traumatic injury within the cervical spine. 2. Prominent anterior bulky endplate osteophytic spurring throughout the cervical spine. Electronically Signed   By: Jeannine Boga M.D.   On: 02/09/2019 18:23   Dg Thoracic Spine 2 View  Result Date: 02/09/2019 CLINICAL DATA:  Initial evaluation for acute back pain status post trauma, crush injury. EXAM: THORACIC SPINE 2 VIEWS COMPARISON:  None. FINDINGS: Vertebral bodies normally aligned with preservation of the normal thoracic kyphosis. No listhesis or malalignment. Mild height loss with associated Schmorl's node at the superior endplate of T6 is chronic and likely degenerative in nature. Mild scattered endplate osteophytic spurring. No appreciable soft tissue abnormality. IMPRESSION: No radiographic evidence for acute traumatic injury within the thoracic spine. Electronically Signed   By: Jeannine Boga M.D.   On: 02/09/2019 18:26   Dg Lumbar Spine Complete  Result Date: 02/09/2019 CLINICAL DATA:  Initial evaluation for acute back pain status post trauma, crush injury. EXAM: LUMBAR SPINE - COMPLETE 4+ VIEW COMPARISON:  None. FINDINGS: 5 lumbar type non-rib-bearing vertebral bodies. Normal alignment with preservation of the normal lumbar lordosis. No listhesis. Compression deformity involving the superior endplate of L4 with mild 20% height loss without bony retropulsion, suspected to be acute in nature. Vertebral body heights otherwise maintained. Visualized sacrum and pelvis intact. SI  joints approximated. Anterior endplate osteophytic spurring seen at L2-3 through L4-5. No acute soft tissue abnormality. IMPRESSION: Compression deformity involving the superior endplate of L4 with up to 20% height loss without bony retropulsion. While this finding is somewhat age indeterminate, this is favored to be acute in nature. Correlation with physical exam for possible pain at this location recommended. Electronically Signed   By: Jeannine Boga M.D.   On: 02/09/2019 18:30   Ct Chest W Contrast  Result Date: 02/09/2019 CLINICAL DATA:  53 year old male with blunt trauma to the chest. Patient complaining of lower back pain. EXAM: CT CHEST, ABDOMEN, AND PELVIS WITH CONTRAST TECHNIQUE: Multidetector CT imaging of the chest, abdomen and pelvis was performed following the standard protocol during bolus administration of intravenous contrast. CONTRAST:  172mL OMNIPAQUE IOHEXOL 300 MG/ML  SOLN COMPARISON:  Lumbar spine CT dated 02/09/2019 and chest radiograph dated 02/09/2019 FINDINGS: CT CHEST FINDINGS Cardiovascular: Top-normal cardiac size. No pericardial effusion. The thoracic aorta is unremarkable. The visualized origins of the great vessels of the aortic arch appear patent. The central pulmonary arteries are patent as visualized. Mediastinum/Nodes: There is no hilar or mediastinal adenopathy. The esophagus and the thyroid gland are grossly unremarkable. No mediastinal fluid collection. Lungs/Pleura: There is a small right pneumothorax (less than 20%). There is no focal consolidation, or pleural effusion. The central airways are patent. Musculoskeletal: Multiple nondisplaced posterior right rib fractures involving the seventh-ninth ribs. There is minimal age indeterminate compression fracture of the superior endplate of T6, likely chronic. Clinical correlation is recommended. No retropulsed fragment. Minimally displaced fracture of the right transverse process of T12 at the costovertebral junction.  Nondisplaced linear fracture of the right transverse process of T10 and T11. CT ABDOMEN PELVIS FINDINGS No intra-abdominal free air or free fluid. Hepatobiliary: No focal liver abnormality is seen. No gallstones, gallbladder wall thickening, or biliary dilatation. Pancreas: Unremarkable. No pancreatic ductal dilatation or surrounding inflammatory changes. Spleen: Normal in size without focal abnormality. Adrenals/Urinary Tract: Adrenal glands are unremarkable. Kidneys are normal, without renal calculi, focal lesion, or hydronephrosis. Bladder is unremarkable. Stomach/Bowel: There is distal colonic diverticulosis without active inflammatory changes. There is no bowel obstruction or active inflammation. Normal appendix. Vascular/Lymphatic: No significant vascular findings are present. No enlarged abdominal or pelvic lymph nodes. Reproductive: The prostate and seminal vesicles are grossly unremarkable. Other: Thickening and nodularity of the subcutaneous soft tissues at the umbilicus likely postsurgical scarring. No fluid collection. Musculoskeletal: Age indeterminate fracture of the superior endplate of L4 with approximately 30% loss of vertebral body height. Correlation with clinical exam and point tenderness recommended. Mildly displaced fractures of the right transverse processes of the L1-L4. IMPRESSION: 1. Small right pneumothorax. 2. Multiple nondisplaced right posterior rib fractures as well as nondisplaced right thoracic transverse process fractures. Multilevel displaced lumbar spine right transverse process fractures. 3. Age indeterminate mild compression fractures of T6 and L4. Correlation with clinical exam and point tenderness recommended. 4. No acute/traumatic intra-abdominal or pelvic solid organ or hollow viscus injury. These results were called by telephone at the time of interpretation on 02/09/2019 at 10:44 pm to Dr. Varney Biles , who verbally acknowledged these results. Electronically Signed   By:  Anner Crete M.D.   On: 02/09/2019 22:48   Ct Abdomen Pelvis W Contrast  Result Date: 02/09/2019 CLINICAL DATA:  53 year old male with blunt trauma to the chest. Patient complaining of lower back pain. EXAM: CT CHEST, ABDOMEN, AND PELVIS WITH CONTRAST TECHNIQUE: Multidetector CT imaging of the chest, abdomen and pelvis was  performed following the standard protocol during bolus administration of intravenous contrast. CONTRAST:  126mL OMNIPAQUE IOHEXOL 300 MG/ML  SOLN COMPARISON:  Lumbar spine CT dated 02/09/2019 and chest radiograph dated 02/09/2019 FINDINGS: CT CHEST FINDINGS Cardiovascular: Top-normal cardiac size. No pericardial effusion. The thoracic aorta is unremarkable. The visualized origins of the great vessels of the aortic arch appear patent. The central pulmonary arteries are patent as visualized. Mediastinum/Nodes: There is no hilar or mediastinal adenopathy. The esophagus and the thyroid gland are grossly unremarkable. No mediastinal fluid collection. Lungs/Pleura: There is a small right pneumothorax (less than 20%). There is no focal consolidation, or pleural effusion. The central airways are patent. Musculoskeletal: Multiple nondisplaced posterior right rib fractures involving the seventh-ninth ribs. There is minimal age indeterminate compression fracture of the superior endplate of T6, likely chronic. Clinical correlation is recommended. No retropulsed fragment. Minimally displaced fracture of the right transverse process of T12 at the costovertebral junction. Nondisplaced linear fracture of the right transverse process of T10 and T11. CT ABDOMEN PELVIS FINDINGS No intra-abdominal free air or free fluid. Hepatobiliary: No focal liver abnormality is seen. No gallstones, gallbladder wall thickening, or biliary dilatation. Pancreas: Unremarkable. No pancreatic ductal dilatation or surrounding inflammatory changes. Spleen: Normal in size without focal abnormality. Adrenals/Urinary Tract: Adrenal  glands are unremarkable. Kidneys are normal, without renal calculi, focal lesion, or hydronephrosis. Bladder is unremarkable. Stomach/Bowel: There is distal colonic diverticulosis without active inflammatory changes. There is no bowel obstruction or active inflammation. Normal appendix. Vascular/Lymphatic: No significant vascular findings are present. No enlarged abdominal or pelvic lymph nodes. Reproductive: The prostate and seminal vesicles are grossly unremarkable. Other: Thickening and nodularity of the subcutaneous soft tissues at the umbilicus likely postsurgical scarring. No fluid collection. Musculoskeletal: Age indeterminate fracture of the superior endplate of L4 with approximately 30% loss of vertebral body height. Correlation with clinical exam and point tenderness recommended. Mildly displaced fractures of the right transverse processes of the L1-L4. IMPRESSION: 1. Small right pneumothorax. 2. Multiple nondisplaced right posterior rib fractures as well as nondisplaced right thoracic transverse process fractures. Multilevel displaced lumbar spine right transverse process fractures. 3. Age indeterminate mild compression fractures of T6 and L4. Correlation with clinical exam and point tenderness recommended. 4. No acute/traumatic intra-abdominal or pelvic solid organ or hollow viscus injury. These results were called by telephone at the time of interpretation on 02/09/2019 at 10:44 pm to Dr. Varney Biles , who verbally acknowledged these results. Electronically Signed   By: Anner Crete M.D.   On: 02/09/2019 22:48   Ct L-spine No Charge  Result Date: 02/09/2019 CLINICAL DATA:  53 year old male with trauma. EXAM: CT LUMBAR SPINE WITHOUT CONTRAST TECHNIQUE: Multidetector CT imaging of the lumbar spine was performed without intravenous contrast administration. Multiplanar CT image reconstructions were also generated. COMPARISON:  CT of the abdomen pelvis dated 02/09/2019 FINDINGS: Segmentation: 5  lumbar type vertebrae. Alignment: Normal. Vertebrae: Age indeterminate compression fracture of the superior endplate of L4 with approximately 30% loss of vertebral body height. This is concerning for an acute fracture. Correlation with clinical exam and point tenderness recommended. No retropulsed fragment. Mildly displaced fractures of the right L1-L4 transverse processes. Paraspinal and other soft tissues: Small amount of hematoma in the right psoas muscle. Disc levels: No significant degenerative changes. IMPRESSION: 1. Age indeterminate, likely acute, compression fracture of the superior endplate of L4. Clinical correlation is recommended. 2. Mildly displaced fractures of the right L1-L4 transverse processes. Electronically Signed   By: Anner Crete M.D.   On:  02/09/2019 22:54   Dg Femur Min 2 Views Left  Result Date: 02/10/2019 CLINICAL DATA:  Left upper leg pain after being crushed by piece of machinery tonight at work. EXAM: LEFT FEMUR 2 VIEWS COMPARISON:  CT abdomen and pelvis 02/09/2019. FINDINGS: Degenerative changes in the left hip and left knee. No acute fracture or dislocation of the left hip. Left femur appears intact. Vascular calcifications. Residual contrast material in the bladder. IMPRESSION: No acute bony abnormalities. Degenerative changes in the hip and knee. Electronically Signed   By: Lucienne Capers M.D.   On: 02/10/2019 00:20    Procedures .Critical Care Performed by: Varney Biles, MD Authorized by: Varney Biles, MD   Critical care provider statement:    Critical care time (minutes):  55   Critical care was necessary to treat or prevent imminent or life-threatening deterioration of the following conditions:  Trauma   Critical care was time spent personally by me on the following activities:  Discussions with consultants, evaluation of patient's response to treatment, examination of patient, ordering and performing treatments and interventions, ordering and review  of laboratory studies, ordering and review of radiographic studies, pulse oximetry, re-evaluation of patient's condition, obtaining history from patient or surrogate and review of old charts Wound repair Date/Time: 02/10/2019 12:50 AM Performed by: Varney Biles, MD Authorized by: Varney Biles, MD  Consent: Verbal consent obtained. Consent given by: patient Patient identity confirmed: arm band Local anesthesia used: no  Anesthesia: Local anesthesia used: no  Sedation: Patient sedated: no  Patient tolerance: Patient tolerated the procedure well with no immediate complications Comments: Patient had multiple superficial lacerations that were repaired with Dermabond and Steri-Strips. Both the lacerations measured about 6 cm in length.    (including critical care time)  Medications Ordered in ED Medications  oxyCODONE-acetaminophen (PERCOCET/ROXICET) 5-325 MG per tablet 1 tablet (1 tablet Oral Given 02/09/19 1724)  fentaNYL (SUBLIMAZE) injection 100 mcg (has no administration in time range)  oxyCODONE-acetaminophen (PERCOCET/ROXICET) 5-325 MG per tablet 2 tablet (2 tablets Oral Given 02/09/19 1958)  diazepam (VALIUM) tablet 5 mg (5 mg Oral Given 02/09/19 1958)  iohexol (OMNIPAQUE) 300 MG/ML solution 100 mL (100 mLs Intravenous Contrast Given 02/09/19 2209)  methocarbamol (ROBAXIN) 1,000 mg in dextrose 5 % 50 mL IVPB (0 mg Intravenous Stopped 02/09/19 2352)  fentaNYL (SUBLIMAZE) injection 100 mcg (100 mcg Intravenous Given 02/09/19 2319)  fentaNYL (SUBLIMAZE) injection 100 mcg (100 mcg Intravenous Given 02/10/19 0013)     Initial Impression / Assessment and Plan / ED Course  I have reviewed the triage vital signs and the nursing notes.  Pertinent labs & imaging results that were available during my care of the patient were reviewed by me and considered in my medical decision making (see chart for details).        53 year old male comes in with chief complaint of back pain.  He was  involved in an industrial accident earlier today, and is complaining of pleuritic type chest pain as well. Our examination reveals uncomfortable appearing male who has significant discomfort with palpation of his thoracic and lumbar spine.  He had x-rays done in triage which showed only an L4 compression fracture.  His pain is out of proportion to her exam right now, and given the mechanism of injury we will get CT scans of his chest, abdomen and pelvis.  I do feel comfortable clearing his C-spine given his exam is completely normal and he had cervical spine x-rays which did not show any red flags concerning for  clinically significant C-spine injury.  Final Clinical Impressions(s) / ED Diagnoses   Final diagnoses:  Back pain  Traumatic pneumothorax, initial encounter  Closed fracture of multiple ribs, unspecified laterality, initial encounter  Closed fracture of thoracic vertebra, unspecified fracture morphology, unspecified thoracic vertebral level, initial encounter ([place])  Closed fracture dislocation of lumbar spine, initial encounter [facility])  Work related injury    ED Discharge Orders    None       Varney Biles, MD 02/10/19 [id number]

## 2019-02-10 NOTE — Progress Notes (Signed)
Or[facility] Tech Progress Note Patient Details:  Daniel [name] [birth year] [id number]  Patient ID: [name], male   DOB: [birth year], 53 y.o.   MRN: [id number]   [place] 02/10/2019, 8:20 AMCalled [employer] for TLSO brace.

## 2019-02-10 NOTE — Progress Notes (Signed)
[facility]/Trauma Progress Note  Subjective: CC: Back pain  Patient is lying flat in bed. He reports 5/10 back pain and pain with taking a deep breath. He is using the PCA button often for pain. He is using the IS and pulling ~2000. Denies numbness or tingling. Denies nausea, vomiting or abdominal pain. Denies flatus. Denies fever or chills.   Objective: Vital signs in last 24 hours: Temp:  [98.3 F (36.8 C)] 98.3 F (36.8 C) (02/24 1659) Pulse Rate:  [70-149] 88 (02/25 0330) Resp:  [13-27] 15 (02/25 0455) BP: (131-166)/(78-109) 151/94 (02/25 0330) SpO2:  [95 %-100 %] 95 % (02/25 0455) Weight:  [95.3 kg] 95.3 kg (02/24 2335)    Intake/Output from previous day: 02/24 0701 - 02/25 0700 In: 50.7 [I.V.:0.8; IV Piggyback:49.9] Out: -  Intake/Output this shift: No intake/output data recorded.  PE: Gen:  Alert, NAD, pleasant, cooperative, lying flat in bed Card:  RRR, no M/G/R heard, 2 + radial and pedal pulses bilaterally Pulm:  CTA, no W/R/R, effort normal Abd: Soft, NT/ND, hypoactive bowel sounds Skin: no rashes noted, warm and dry, multiple lacerations to posterior LLE, steri-strips intact with minimal serosanguinous drainage Extremities: MAE with equal strength Neuro: Alert and oriented x 4, MAE with equal strength, BLE sensation intact  Lab Results:  Recent Labs    02/09/19 2100 02/10/19 0332  WBC 13.6* 6.4  HGB 15.4 13.0  HCT 43.2 38.3*  PLT 134* 194   BMET Recent Labs    02/09/19 2100 02/10/19 0332  NA 136 135  K 4.7 4.0  CL 101 100  CO2 24 23  GLUCOSE 120* 128*  BUN 15 12  CREATININE 0.98 1.04  CALCIUM 9.5 9.0   PT/INR No results for input(s): LABPROT, INR in the last 72 hours. CMP     Component Value Date/Time   NA 135 02/10/2019 0332   K 4.0 02/10/2019 0332   CL 100 02/10/2019 0332   CO2 23 02/10/2019 0332   GLUCOSE 128 (H) 02/10/2019 0332   BUN 12 02/10/2019 0332   CREATININE 1.04 02/10/2019 0332   CREATININE 1.06 08/30/2017  0916   CALCIUM 9.0 02/10/2019 0332   PROT 7.2 02/09/2019 2100   ALBUMIN 4.6 02/09/2019 2100   AST 74 (H) 02/09/2019 2100   ALT 70 (H) 02/09/2019 2100   ALKPHOS 79 02/09/2019 2100   BILITOT 1.1 02/09/2019 2100   GFRNONAA >60 02/10/2019 0332   GFRAA >60 02/10/2019 0332   Lipase  No results found for: LIPASE  Studies/Results: Dg Chest 1 View  Result Date: 02/10/2019 CLINICAL DATA:  Chest, rib, and back pain after crush injury. EXAM: CHEST  1 VIEW COMPARISON:  01/11/2016 FINDINGS: Shallow inspiration. Mild cardiac enlargement. No vascular congestion, edema, or consolidation. No blunting of costophrenic angles. No pneumothorax. Mediastinal contours appear intact. Degenerative changes in the spine. IMPRESSION: No active disease. Electronically Signed   By: Lucienne Capers M.D.   On: 02/10/2019 00:21   Dg Cervical Spine 2 Or 3 Views  Result Date: 02/09/2019 CLINICAL DATA:  Initial evaluation for acute back pain status post trauma, crush injury. EXAM: CERVICAL SPINE - 2-3 VIEW COMPARISON:  None. FINDINGS: Mild straightening of the normal cervical lordosis. No listhesis or subluxation. Vertebral body heights maintained with no acute or chronic fracture identified. Normal C1-2 articulations are preserved in the dens is intact. No prevertebral soft tissue swelling. Bulky anterior endplate osteophytic spurring seen throughout the cervical spine. Visualized soft tissues of the neck demonstrate no acute finding. Partially visualized  lung apices are grossly clear. IMPRESSION: 1. No radiographic evidence for acute traumatic injury within the cervical spine. 2. Prominent anterior bulky endplate osteophytic spurring throughout the cervical spine. Electronically Signed   By: Jeannine Boga M.D.   On: 02/09/2019 18:23   Dg Thoracic Spine 2 View  Result Date: 02/09/2019 CLINICAL DATA:  Initial evaluation for acute back pain status post trauma, crush injury. EXAM: THORACIC SPINE 2 VIEWS COMPARISON:  None.  FINDINGS: Vertebral bodies normally aligned with preservation of the normal thoracic kyphosis. No listhesis or malalignment. Mild height loss with associated Schmorl's node at the superior endplate of T6 is chronic and likely degenerative in nature. Mild scattered endplate osteophytic spurring. No appreciable soft tissue abnormality. IMPRESSION: No radiographic evidence for acute traumatic injury within the thoracic spine. Electronically Signed   By: Jeannine Boga M.D.   On: 02/09/2019 18:26   Dg Lumbar Spine Complete  Result Date: 02/09/2019 CLINICAL DATA:  Initial evaluation for acute back pain status post trauma, crush injury. EXAM: LUMBAR SPINE - COMPLETE 4+ VIEW COMPARISON:  None. FINDINGS: 5 lumbar type non-rib-bearing vertebral bodies. Normal alignment with preservation of the normal lumbar lordosis. No listhesis. Compression deformity involving the superior endplate of L4 with mild 20% height loss without bony retropulsion, suspected to be acute in nature. Vertebral body heights otherwise maintained. Visualized sacrum and pelvis intact. SI joints approximated. Anterior endplate osteophytic spurring seen at L2-3 through L4-5. No acute soft tissue abnormality. IMPRESSION: Compression deformity involving the superior endplate of L4 with up to 20% height loss without bony retropulsion. While this finding is somewhat age indeterminate, this is favored to be acute in nature. Correlation with physical exam for possible pain at this location recommended. Electronically Signed   By: Jeannine Boga M.D.   On: 02/09/2019 18:30   Ct Chest W Contrast  Result Date: 02/09/2019 CLINICAL DATA:  53 year old male with blunt trauma to the chest. Patient complaining of lower back pain. EXAM: CT CHEST, ABDOMEN, AND PELVIS WITH CONTRAST TECHNIQUE: Multidetector CT imaging of the chest, abdomen and pelvis was performed following the standard protocol during bolus administration of intravenous contrast.  CONTRAST:  19mL OMNIPAQUE IOHEXOL 300 MG/ML  SOLN COMPARISON:  Lumbar spine CT dated 02/09/2019 and chest radiograph dated 02/09/2019 FINDINGS: CT CHEST FINDINGS Cardiovascular: Top-normal cardiac size. No pericardial effusion. The thoracic aorta is unremarkable. The visualized origins of the great vessels of the aortic arch appear patent. The central pulmonary arteries are patent as visualized. Mediastinum/Nodes: There is no hilar or mediastinal adenopathy. The esophagus and the thyroid gland are grossly unremarkable. No mediastinal fluid collection. Lungs/Pleura: There is a small right pneumothorax (less than 20%). There is no focal consolidation, or pleural effusion. The central airways are patent. Musculoskeletal: Multiple nondisplaced posterior right rib fractures involving the seventh-ninth ribs. There is minimal age indeterminate compression fracture of the superior endplate of T6, likely chronic. Clinical correlation is recommended. No retropulsed fragment. Minimally displaced fracture of the right transverse process of T12 at the costovertebral junction. Nondisplaced linear fracture of the right transverse process of T10 and T11. CT ABDOMEN PELVIS FINDINGS No intra-abdominal free air or free fluid. Hepatobiliary: No focal liver abnormality is seen. No gallstones, gallbladder wall thickening, or biliary dilatation. Pancreas: Unremarkable. No pancreatic ductal dilatation or surrounding inflammatory changes. Spleen: Normal in size without focal abnormality. Adrenals/Urinary Tract: Adrenal glands are unremarkable. Kidneys are normal, without renal calculi, focal lesion, or hydronephrosis. Bladder is unremarkable. Stomach/Bowel: There is distal colonic diverticulosis without active inflammatory changes. There  is no bowel obstruction or active inflammation. Normal appendix. Vascular/Lymphatic: No significant vascular findings are present. No enlarged abdominal or pelvic lymph nodes. Reproductive: The prostate  and seminal vesicles are grossly unremarkable. Other: Thickening and nodularity of the subcutaneous soft tissues at the umbilicus likely postsurgical scarring. No fluid collection. Musculoskeletal: Age indeterminate fracture of the superior endplate of L4 with approximately 30% loss of vertebral body height. Correlation with clinical exam and point tenderness recommended. Mildly displaced fractures of the right transverse processes of the L1-L4. IMPRESSION: 1. Small right pneumothorax. 2. Multiple nondisplaced right posterior rib fractures as well as nondisplaced right thoracic transverse process fractures. Multilevel displaced lumbar spine right transverse process fractures. 3. Age indeterminate mild compression fractures of T6 and L4. Correlation with clinical exam and point tenderness recommended. 4. No acute/traumatic intra-abdominal or pelvic solid organ or hollow viscus injury. These results were called by telephone at the time of interpretation on 02/09/2019 at 10:44 pm to Dr. Varney Biles , who verbally acknowledged these results. Electronically Signed   By: Anner Crete M.D.   On: 02/09/2019 22:48   Ct Abdomen Pelvis W Contrast  Result Date: 02/09/2019 CLINICAL DATA:  53 year old male with blunt trauma to the chest. Patient complaining of lower back pain. EXAM: CT CHEST, ABDOMEN, AND PELVIS WITH CONTRAST TECHNIQUE: Multidetector CT imaging of the chest, abdomen and pelvis was performed following the standard protocol during bolus administration of intravenous contrast. CONTRAST:  163mL OMNIPAQUE IOHEXOL 300 MG/ML  SOLN COMPARISON:  Lumbar spine CT dated 02/09/2019 and chest radiograph dated 02/09/2019 FINDINGS: CT CHEST FINDINGS Cardiovascular: Top-normal cardiac size. No pericardial effusion. The thoracic aorta is unremarkable. The visualized origins of the great vessels of the aortic arch appear patent. The central pulmonary arteries are patent as visualized. Mediastinum/Nodes: There is no hilar  or mediastinal adenopathy. The esophagus and the thyroid gland are grossly unremarkable. No mediastinal fluid collection. Lungs/Pleura: There is a small right pneumothorax (less than 20%). There is no focal consolidation, or pleural effusion. The central airways are patent. Musculoskeletal: Multiple nondisplaced posterior right rib fractures involving the seventh-ninth ribs. There is minimal age indeterminate compression fracture of the superior endplate of T6, likely chronic. Clinical correlation is recommended. No retropulsed fragment. Minimally displaced fracture of the right transverse process of T12 at the costovertebral junction. Nondisplaced linear fracture of the right transverse process of T10 and T11. CT ABDOMEN PELVIS FINDINGS No intra-abdominal free air or free fluid. Hepatobiliary: No focal liver abnormality is seen. No gallstones, gallbladder wall thickening, or biliary dilatation. Pancreas: Unremarkable. No pancreatic ductal dilatation or surrounding inflammatory changes. Spleen: Normal in size without focal abnormality. Adrenals/Urinary Tract: Adrenal glands are unremarkable. Kidneys are normal, without renal calculi, focal lesion, or hydronephrosis. Bladder is unremarkable. Stomach/Bowel: There is distal colonic diverticulosis without active inflammatory changes. There is no bowel obstruction or active inflammation. Normal appendix. Vascular/Lymphatic: No significant vascular findings are present. No enlarged abdominal or pelvic lymph nodes. Reproductive: The prostate and seminal vesicles are grossly unremarkable. Other: Thickening and nodularity of the subcutaneous soft tissues at the umbilicus likely postsurgical scarring. No fluid collection. Musculoskeletal: Age indeterminate fracture of the superior endplate of L4 with approximately 30% loss of vertebral body height. Correlation with clinical exam and point tenderness recommended. Mildly displaced fractures of the right transverse processes of  the L1-L4. IMPRESSION: 1. Small right pneumothorax. 2. Multiple nondisplaced right posterior rib fractures as well as nondisplaced right thoracic transverse process fractures. Multilevel displaced lumbar spine right transverse process fractures. 3. Age indeterminate  mild compression fractures of T6 and L4. Correlation with clinical exam and point tenderness recommended. 4. No acute/traumatic intra-abdominal or pelvic solid organ or hollow viscus injury. These results were called by telephone at the time of interpretation on 02/09/2019 at 10:44 pm to Dr. Varney Biles , who verbally acknowledged these results. Electronically Signed   By: Anner Crete M.D.   On: 02/09/2019 22:48   Ct L-spine No Charge  Result Date: 02/09/2019 CLINICAL DATA:  53 year old male with trauma. EXAM: CT LUMBAR SPINE WITHOUT CONTRAST TECHNIQUE: Multidetector CT imaging of the lumbar spine was performed without intravenous contrast administration. Multiplanar CT image reconstructions were also generated. COMPARISON:  CT of the abdomen pelvis dated 02/09/2019 FINDINGS: Segmentation: 5 lumbar type vertebrae. Alignment: Normal. Vertebrae: Age indeterminate compression fracture of the superior endplate of L4 with approximately 30% loss of vertebral body height. This is concerning for an acute fracture. Correlation with clinical exam and point tenderness recommended. No retropulsed fragment. Mildly displaced fractures of the right L1-L4 transverse processes. Paraspinal and other soft tissues: Small amount of hematoma in the right psoas muscle. Disc levels: No significant degenerative changes. IMPRESSION: 1. Age indeterminate, likely acute, compression fracture of the superior endplate of L4. Clinical correlation is recommended. 2. Mildly displaced fractures of the right L1-L4 transverse processes. Electronically Signed   By: Anner Crete M.D.   On: 02/09/2019 22:54   Dg Chest Port 1 View  Result Date: 02/10/2019 CLINICAL DATA:   Right-sided pneumothorax from a crush injury yesterday. EXAM: PORTABLE CHEST 1 VIEW COMPARISON:  Chest 02/09/2019. CT chest 02/09/2019 FINDINGS: Mild cardiac enlargement. No vascular congestion, edema, or consolidation. Right pneumothorax seen at CT is not visualized radiographically. No blunting of costophrenic angles. Mediastinal contours appear intact. IMPRESSION: Cardiac enlargement. No evidence of active pulmonary disease. Right pneumothorax seen at CT is not visualized radiographically. Electronically Signed   By: Lucienne Capers M.D.   On: 02/10/2019 03:35   Dg Femur Min 2 Views Left  Result Date: 02/10/2019 CLINICAL DATA:  Left upper leg pain after being crushed by piece of machinery tonight at work. EXAM: LEFT FEMUR 2 VIEWS COMPARISON:  CT abdomen and pelvis 02/09/2019. FINDINGS: Degenerative changes in the left hip and left knee. No acute fracture or dislocation of the left hip. Left femur appears intact. Vascular calcifications. Residual contrast material in the bladder. IMPRESSION: No acute bony abnormalities. Degenerative changes in the hip and knee. Electronically Signed   By: Lucienne Capers M.D.   On: 02/10/2019 00:20   Assessment/Plan 53 year old male status post crush injury Small right pneumothorax: CXR today, right pneumothorax not seen; Pulling ~2000 on IS; Continue pulmonary toilet with IS, ambulate, PT/OT Right 7 through 9 rib fractures: See above; Pain control, d/c PCA and start oxycodone and robaxin PRN and schedule acetaminophen T6 superior endplate fracture: NS consult, may mobilize in TLSO, f/u in office PRN; Pain control, see above, PT/OT T12 transverse process fracture: See above. Right T10, T11 transverse process fracture: See above.  Superior endplate of L4 fracture: See above. Mild compression fracture of T6 and L4-age indeterminate: See above. Right L1-L4 transverse process fracture: See above.  LLE lacerations: Repaired on 2/25 in ED by EDP Hypertension: On  carvedilol 12.5mg  BID and losartan 100mg  daily PTA, BP 131-166/89-94; Continue carvedilol, restart losartan Hyperlipidemia: On niacin 500mg  and fish oil/omega3 daily PTA Type II Diabetes: On metformin 850mg  BID PTA, CBG 164 this AM; Continue CBGs ACHS and SSI GERD: On pantoprazole 40mg  daily PTA; Continue pantoprazole Insomnia: On zolpidem  12.5mg  CR qhs PRN; Continue zolpidem 5mg  qhs  FEN: Decrease D5NS to 69ml/hr; Heart healthy diet ordered; BMP tomorrow VTE: SCD's, lovenox ID: No active issues, WBC 6.4 today, afebrile Foley: None Follow up: NS PRN  DISPO: PT/OT to evaluate today.   LOS: 0 days    Larence Penning , NP-S 02/10/2019, 8:02 AM  Transition off PCA Oral meds PT/OT  Patient examined and I agree with the assessment and plan  Georganna Skeans, MD, MPH, FACS Trauma: [phone] General Surgery: [phone]  02/10/2019 11:15 AM

## 2019-02-10 NOTE — Progress Notes (Signed)
PT Cancellation Note  Patient Details Name: REZA CRYMES MRN: [id number] DOB: 06-10-1966   Cancelled Treatment:    Reason Eval/Treat Not Completed: Other (comment). Can not mobilize patient until he has his TLSO brace. Brace just ordered, awaiting fitting. PT to return as able once patient received TLSO to complete PT eval and progress mobility.   Kittie Plater, PT, DPT Acute Rehabilitation Services Pager #: [phone] Office #: [phone]   Berline Lopes 02/10/2019, 9:16 AM
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
## 2019-02-10 NOTE — Progress Notes (Signed)
PCA d/c'd today, remaining dilaudid 15mg  wasted and witness by Isabelle Course, RN.

## 2019-02-10 NOTE — ED Notes (Signed)
Condom cath applied

## 2019-02-10 NOTE — ED Provider Notes (Signed)
Discussed patient with neurosurgery on-call.  Compression fractures are of indeterminate age, not clearly acute.  No specific treatment needed for transverse process fractures.  Do not recommend any bracing or further imaging at this time, will see patient in consultation in the morning.   Orpah Greek, MD 02/10/19 [phone]

## 2019-02-10 NOTE — H&P (Signed)
History   Daniel [name] is an 53 y.o. male.   Chief Complaint:  Chief Complaint  Patient presents with  . Back Pain    53 year old male, with a history of diabetes, hyperlipidemia, hypertension, who comes in status post crush injury.  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  He states that this sat him down and basically folded him in half.  Patient states that since that time he has had mid and lower back pain.  Patient denies any LOC.  Patient was brought to the ER for further evaluation.  Upon evaluation the ER patient was seen to have multiple spinal fractures, rib fractures, and small pneumothorax.  Trauma surgery and neurosurgery was consulted for further evaluation and management   Past Medical History:  Diagnosis Date  . Asthma   . Depression    h/o  . Diabetes mellitus   . GERD (gastroesophageal reflux disease)   . H/O Clostridium difficile infection 08/2015  . Hyperlipidemia   . Hypertension   . Insomnia   . Polyarthralgia 2009   blood work (-) CKs slightly elevated, bone san (-) saw rheumatology; continue w/ somptoms after holding zocor    Past Surgical History:  Procedure Laterality Date  . HAND SURGERY Left 2006  . HEMORRHOID SURGERY    . HERNIA REPAIR  22/0254   umbilical  . KNEE SURGERY Right 2011   . NASAL FRACTURE SURGERY  2006    Family History  Problem Relation Age of Onset  . Diabetes Mother        ??  . Hypertension Mother   . Diabetes Father   . Prostate cancer Neg Hx   . Colon cancer Neg Hx   . Coronary artery disease Neg Hx   . Stroke Neg Hx    Social History:  reports that he is a non-smoker but has been exposed to tobacco smoke. He has never used smokeless tobacco. He reports current alcohol use of about 5.0 standard drinks of alcohol per week. He reports that he does not use drugs.  Allergies   Allergies    Allergen Reactions  . Levofloxacin Other (See Comments)    Tendon rupture at wrist    Home Medications  (Not in a hospital admission)   Trauma Course   Results for orders placed or performed during the hospital encounter of 02/09/19 (from the past 48 hour(s))  CBC     Status: Abnormal   Collection Time: 02/09/19  9:00 PM  Result Value Ref Range   WBC 13.6 (H) 4.0 - 10.5 K/uL   RBC 4.97 4.22 - 5.81 MIL/uL   Hemoglobin 15.4 13.0 - 17.0 g/dL   HCT 43.2 39.0 - 52.0 %   MCV 86.9 80.0 - 100.0 fL   MCH 31.0 26.0 - 34.0 pg   MCHC 35.6 30.0 - 36.0 g/dL   RDW 13.4 11.5 - 15.5 %   Platelets 134 (L) 150 - 400 K/uL   nRBC 0.0 0.0 - 0.2 %    Comment: Performed at [facility], [address], [place], [place] [address]  Comprehensive metabolic panel     Status: Abnormal   Collection Time: 02/09/19  9:00 PM  Result Value Ref Range   Sodium 136 135 - 145 mmol/L   Potassium 4.7 3.5 - 5.1 mmol/L   Chloride 101 98 - 111 mmol/L  CO2 24 22 - 32 mmol/L   Glucose, Bld 120 (H) 70 - 99 mg/dL   BUN 15 6 - 20 mg/dL   Creatinine, Ser 0.98 0.61 - 1.24 mg/dL   Calcium 9.5 8.9 - 10.3 mg/dL   Total Protein 7.2 6.5 - 8.1 g/dL   Albumin 4.6 3.5 - 5.0 g/dL   AST 74 (H) 15 - 41 U/L   ALT 70 (H) 0 - 44 U/L   Alkaline Phosphatase 79 38 - 126 U/L   Total Bilirubin 1.1 0.3 - 1.2 mg/dL   GFR calc non Af Amer >60 >60 mL/min   GFR calc Af Amer >60 >60 mL/min   Anion gap 11 5 - 15    Comment: Performed at [facility] [address], [place], [place] [address]   Dg Chest 1 View  Result Date: 02/10/2019 CLINICAL DATA:  Chest, rib, and back pain after crush injury. EXAM: CHEST  1 VIEW COMPARISON:  01/11/2016 FINDINGS: Shallow inspiration. Mild cardiac enlargement. No vascular congestion, edema, or consolidation. No blunting of costophrenic angles. No pneumothorax. Mediastinal contours appear intact. Degenerative changes in the spine. IMPRESSION: No active disease. Electronically Signed   By:  Lucienne Capers M.D.   On: 02/10/2019 00:21   Dg Cervical Spine 2 Or 3 Views  Result Date: 02/09/2019 CLINICAL DATA:  Initial evaluation for acute back pain status post trauma, crush injury. EXAM: CERVICAL SPINE - 2-3 VIEW COMPARISON:  None. FINDINGS: Mild straightening of the normal cervical lordosis. No listhesis or subluxation. Vertebral body heights maintained with no acute or chronic fracture identified. Normal C1-2 articulations are preserved in the dens is intact. No prevertebral soft tissue swelling. Bulky anterior endplate osteophytic spurring seen throughout the cervical spine. Visualized soft tissues of the neck demonstrate no acute finding. Partially visualized lung apices are grossly clear. IMPRESSION: 1. No radiographic evidence for acute traumatic injury within the cervical spine. 2. Prominent anterior bulky endplate osteophytic spurring throughout the cervical spine. Electronically Signed   By: Jeannine Boga M.D.   On: 02/09/2019 18:23   Dg Thoracic Spine 2 View  Result Date: 02/09/2019 CLINICAL DATA:  Initial evaluation for acute back pain status post trauma, crush injury. EXAM: THORACIC SPINE 2 VIEWS COMPARISON:  None. FINDINGS: Vertebral bodies normally aligned with preservation of the normal thoracic kyphosis. No listhesis or malalignment. Mild height loss with associated Schmorl's node at the superior endplate of T6 is chronic and likely degenerative in nature. Mild scattered endplate osteophytic spurring. No appreciable soft tissue abnormality. IMPRESSION: No radiographic evidence for acute traumatic injury within the thoracic spine. Electronically Signed   By: Jeannine Boga M.D.   On: 02/09/2019 18:26   Dg Lumbar Spine Complete  Result Date: 02/09/2019 CLINICAL DATA:  Initial evaluation for acute back pain status post trauma, crush injury. EXAM: LUMBAR SPINE - COMPLETE 4+ VIEW COMPARISON:  None. FINDINGS: 5 lumbar type non-rib-bearing vertebral bodies. Normal  alignment with preservation of the normal lumbar lordosis. No listhesis. Compression deformity involving the superior endplate of L4 with mild 20% height loss without bony retropulsion, suspected to be acute in nature. Vertebral body heights otherwise maintained. Visualized sacrum and pelvis intact. SI joints approximated. Anterior endplate osteophytic spurring seen at L2-3 through L4-5. No acute soft tissue abnormality. IMPRESSION: Compression deformity involving the superior endplate of L4 with up to 20% height loss without bony retropulsion. While this finding is somewhat age indeterminate, this is favored to be acute in nature. Correlation with physical exam for possible pain  at this location recommended. Electronically Signed   By: Jeannine Boga M.D.   On: 02/09/2019 18:30   Ct Chest W Contrast  Result Date: 02/09/2019 CLINICAL DATA:  53 year old male with blunt trauma to the chest. Patient complaining of lower back pain. EXAM: CT CHEST, ABDOMEN, AND PELVIS WITH CONTRAST TECHNIQUE: Multidetector CT imaging of the chest, abdomen and pelvis was performed following the standard protocol during bolus administration of intravenous contrast. CONTRAST:  11mL OMNIPAQUE IOHEXOL 300 MG/ML  SOLN COMPARISON:  Lumbar spine CT dated 02/09/2019 and chest radiograph dated 02/09/2019 FINDINGS: CT CHEST FINDINGS Cardiovascular: Top-normal cardiac size. No pericardial effusion. The thoracic aorta is unremarkable. The visualized origins of the great vessels of the aortic arch appear patent. The central pulmonary arteries are patent as visualized. Mediastinum/Nodes: There is no hilar or mediastinal adenopathy. The esophagus and the thyroid gland are grossly unremarkable. No mediastinal fluid collection. Lungs/Pleura: There is a small right pneumothorax (less than 20%). There is no focal consolidation, or pleural effusion. The central airways are patent. Musculoskeletal: Multiple nondisplaced posterior right rib  fractures involving the seventh-ninth ribs. There is minimal age indeterminate compression fracture of the superior endplate of T6, likely chronic. Clinical correlation is recommended. No retropulsed fragment. Minimally displaced fracture of the right transverse process of T12 at the costovertebral junction. Nondisplaced linear fracture of the right transverse process of T10 and T11. CT ABDOMEN PELVIS FINDINGS No intra-abdominal free air or free fluid. Hepatobiliary: No focal liver abnormality is seen. No gallstones, gallbladder wall thickening, or biliary dilatation. Pancreas: Unremarkable. No pancreatic ductal dilatation or surrounding inflammatory changes. Spleen: Normal in size without focal abnormality. Adrenals/Urinary Tract: Adrenal glands are unremarkable. Kidneys are normal, without renal calculi, focal lesion, or hydronephrosis. Bladder is unremarkable. Stomach/Bowel: There is distal colonic diverticulosis without active inflammatory changes. There is no bowel obstruction or active inflammation. Normal appendix. Vascular/Lymphatic: No significant vascular findings are present. No enlarged abdominal or pelvic lymph nodes. Reproductive: The prostate and seminal vesicles are grossly unremarkable. Other: Thickening and nodularity of the subcutaneous soft tissues at the umbilicus likely postsurgical scarring. No fluid collection. Musculoskeletal: Age indeterminate fracture of the superior endplate of L4 with approximately 30% loss of vertebral body height. Correlation with clinical exam and point tenderness recommended. Mildly displaced fractures of the right transverse processes of the L1-L4. IMPRESSION: 1. Small right pneumothorax. 2. Multiple nondisplaced right posterior rib fractures as well as nondisplaced right thoracic transverse process fractures. Multilevel displaced lumbar spine right transverse process fractures. 3. Age indeterminate mild compression fractures of T6 and L4. Correlation with clinical  exam and point tenderness recommended. 4. No acute/traumatic intra-abdominal or pelvic solid organ or hollow viscus injury. These results were called by telephone at the time of interpretation on 02/09/2019 at 10:44 pm to Dr. Varney Biles , who verbally acknowledged these results. Electronically Signed   By: Anner Crete M.D.   On: 02/09/2019 22:48   Ct Abdomen Pelvis W Contrast  Result Date: 02/09/2019 CLINICAL DATA:  53 year old male with blunt trauma to the chest. Patient complaining of lower back pain. EXAM: CT CHEST, ABDOMEN, AND PELVIS WITH CONTRAST TECHNIQUE: Multidetector CT imaging of the chest, abdomen and pelvis was performed following the standard protocol during bolus administration of intravenous contrast. CONTRAST:  168mL OMNIPAQUE IOHEXOL 300 MG/ML  SOLN COMPARISON:  Lumbar spine CT dated 02/09/2019 and chest radiograph dated 02/09/2019 FINDINGS: CT CHEST FINDINGS Cardiovascular: Top-normal cardiac size. No pericardial effusion. The thoracic aorta is unremarkable. The visualized origins of the great vessels of  the aortic arch appear patent. The central pulmonary arteries are patent as visualized. Mediastinum/Nodes: There is no hilar or mediastinal adenopathy. The esophagus and the thyroid gland are grossly unremarkable. No mediastinal fluid collection. Lungs/Pleura: There is a small right pneumothorax (less than 20%). There is no focal consolidation, or pleural effusion. The central airways are patent. Musculoskeletal: Multiple nondisplaced posterior right rib fractures involving the seventh-ninth ribs. There is minimal age indeterminate compression fracture of the superior endplate of T6, likely chronic. Clinical correlation is recommended. No retropulsed fragment. Minimally displaced fracture of the right transverse process of T12 at the costovertebral junction. Nondisplaced linear fracture of the right transverse process of T10 and T11. CT ABDOMEN PELVIS FINDINGS No intra-abdominal free  air or free fluid. Hepatobiliary: No focal liver abnormality is seen. No gallstones, gallbladder wall thickening, or biliary dilatation. Pancreas: Unremarkable. No pancreatic ductal dilatation or surrounding inflammatory changes. Spleen: Normal in size without focal abnormality. Adrenals/Urinary Tract: Adrenal glands are unremarkable. Kidneys are normal, without renal calculi, focal lesion, or hydronephrosis. Bladder is unremarkable. Stomach/Bowel: There is distal colonic diverticulosis without active inflammatory changes. There is no bowel obstruction or active inflammation. Normal appendix. Vascular/Lymphatic: No significant vascular findings are present. No enlarged abdominal or pelvic lymph nodes. Reproductive: The prostate and seminal vesicles are grossly unremarkable. Other: Thickening and nodularity of the subcutaneous soft tissues at the umbilicus likely postsurgical scarring. No fluid collection. Musculoskeletal: Age indeterminate fracture of the superior endplate of L4 with approximately 30% loss of vertebral body height. Correlation with clinical exam and point tenderness recommended. Mildly displaced fractures of the right transverse processes of the L1-L4. IMPRESSION: 1. Small right pneumothorax. 2. Multiple nondisplaced right posterior rib fractures as well as nondisplaced right thoracic transverse process fractures. Multilevel displaced lumbar spine right transverse process fractures. 3. Age indeterminate mild compression fractures of T6 and L4. Correlation with clinical exam and point tenderness recommended. 4. No acute/traumatic intra-abdominal or pelvic solid organ or hollow viscus injury. These results were called by telephone at the time of interpretation on 02/09/2019 at 10:44 pm to Dr. Varney Biles , who verbally acknowledged these results. Electronically Signed   By: Anner Crete M.D.   On: 02/09/2019 22:48   Ct L-spine No Charge  Result Date: 02/09/2019 CLINICAL DATA:  53 year old  male with trauma. EXAM: CT LUMBAR SPINE WITHOUT CONTRAST TECHNIQUE: Multidetector CT imaging of the lumbar spine was performed without intravenous contrast administration. Multiplanar CT image reconstructions were also generated. COMPARISON:  CT of the abdomen pelvis dated 02/09/2019 FINDINGS: Segmentation: 5 lumbar type vertebrae. Alignment: Normal. Vertebrae: Age indeterminate compression fracture of the superior endplate of L4 with approximately 30% loss of vertebral body height. This is concerning for an acute fracture. Correlation with clinical exam and point tenderness recommended. No retropulsed fragment. Mildly displaced fractures of the right L1-L4 transverse processes. Paraspinal and other soft tissues: Small amount of hematoma in the right psoas muscle. Disc levels: No significant degenerative changes. IMPRESSION: 1. Age indeterminate, likely acute, compression fracture of the superior endplate of L4. Clinical correlation is recommended. 2. Mildly displaced fractures of the right L1-L4 transverse processes. Electronically Signed   By: Anner Crete M.D.   On: 02/09/2019 22:54   Dg Femur Min 2 Views Left  Result Date: 02/10/2019 CLINICAL DATA:  Left upper leg pain after being crushed by piece of machinery tonight at work. EXAM: LEFT FEMUR 2 VIEWS COMPARISON:  CT abdomen and pelvis 02/09/2019. FINDINGS: Degenerative changes in the left hip and left knee. No acute fracture  or dislocation of the left hip. Left femur appears intact. Vascular calcifications. Residual contrast material in the bladder. IMPRESSION: No acute bony abnormalities. Degenerative changes in the hip and knee. Electronically Signed   By: Lucienne Capers M.D.   On: 02/10/2019 00:20    Review of Systems  Constitutional: Negative for weight loss.  HENT: Negative for ear discharge, ear pain, hearing loss and tinnitus.   Eyes: Negative for blurred vision, double vision, photophobia and pain.  Respiratory: Negative for cough,  sputum production and shortness of breath.   Cardiovascular: Positive for chest pain.  Gastrointestinal: Negative for abdominal pain, nausea and vomiting.  Genitourinary: Negative for dysuria, flank pain, frequency and urgency.  Musculoskeletal: Positive for back pain and neck pain. Negative for falls, joint pain and myalgias.  Neurological: Negative for dizziness, tingling, sensory change, focal weakness, loss of consciousness and headaches.  Endo/Heme/Allergies: Does not bruise/bleed easily.  Psychiatric/Behavioral: Negative for depression, memory loss and substance abuse. The patient is not nervous/anxious.   All other systems reviewed and are negative.   Blood pressure (!) 161/96, pulse 95, temperature 98.3 F (36.8 C), temperature source Oral, resp. rate 18, height 5\' 11"  (1.803 m), weight 95.3 kg, SpO2 99 %. Physical Exam  Vitals reviewed. Constitutional: He is oriented to person, place, and time. He appears well-developed and well-nourished. He is cooperative. No distress. Cervical collar and nasal cannula in place.  HENT:  Head: Normocephalic and atraumatic. Head is without raccoon's eyes, without Battle's sign, without abrasion, without contusion and without laceration.  Right Ear: Hearing, tympanic membrane, external ear and ear canal normal. No lacerations. No drainage or tenderness. No foreign bodies. Tympanic membrane is not perforated. No hemotympanum.  Left Ear: Hearing, tympanic membrane, external ear and ear canal normal. No lacerations. No drainage or tenderness. No foreign bodies. Tympanic membrane is not perforated. No hemotympanum.  Nose: Nose normal. No nose lacerations, sinus tenderness, nasal deformity or nasal septal hematoma. No epistaxis.  Mouth/Throat: Uvula is midline, oropharynx is clear and moist and mucous membranes are normal. No lacerations.  Eyes: Pupils are equal, round, and reactive to light. Conjunctivae, EOM and lids are normal. No scleral icterus.  Neck:  Trachea normal. No JVD present. No spinous process tenderness and no muscular tenderness present. Carotid bruit is not present. No thyromegaly present.  Cardiovascular: Normal rate, regular rhythm, normal heart sounds, intact distal pulses and normal pulses.  Respiratory: Effort normal and breath sounds normal. No respiratory distress. He exhibits tenderness. He exhibits no bony tenderness, no laceration and no crepitus.    GI: Soft. Normal appearance. He exhibits no distension. Bowel sounds are decreased. There is no abdominal tenderness. There is no rigidity, no rebound, no guarding and no CVA tenderness.  Musculoskeletal: Normal range of motion.        General: No edema.     Cervical back: He exhibits tenderness.     Thoracic back: He exhibits tenderness.     Lumbar back: He exhibits tenderness.  Lymphadenopathy:    He has no cervical adenopathy.  Neurological: He is alert and oriented to person, place, and time. He has normal strength. No cranial nerve deficit or sensory deficit. GCS eye subscore is 4. GCS verbal subscore is 5. GCS motor subscore is 6.  Skin: Skin is warm, dry and intact. He is not diaphoretic.  Psychiatric: He has a normal mood and affect. His speech is normal and behavior is normal.     Assessment/Plan 53 year old male status post crush injury Small right  pneumothorax Right 7 through 9 rib fractures T6 superior endplate fracture  I37 transverse process fracture Right T10, T11 transverse process fracture Superior endplate of L4 fracture Mild compression fracture of T6 and L4-age indeterminate Right L1-L4 transverse process fracture  1.  We will admit him to the stepdown unit, pain control 2.  EDP did discuss case with neurosurgery. 3.  We will keep n.p.o. overnight    Ralene Ok 02/10/2019, 3:04 AM   Procedures
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
## 2019-02-11 ENCOUNTER — Encounter (HOSPITAL_COMMUNITY): Payer: Self-pay | Admitting: *Deleted

## 2019-02-11 LAB — GLUCOSE, CAPILLARY
Glucose-Capillary: 149 mg/dL — ABNORMAL HIGH (ref 70–99)
Glucose-Capillary: 178 mg/dL — ABNORMAL HIGH (ref 70–99)
Glucose-Capillary: 88 mg/dL (ref 70–99)
Glucose-Capillary: 125 mg/dL — ABNORMAL HIGH (ref 70–99)

## 2019-02-11 LAB — BASIC METABOLIC PANEL
Anion gap: 9 (ref 5–15)
BUN: 16 mg/dL (ref 6–20)
CO2: 27 mmol/L (ref 22–32)
Calcium: 8.8 mg/dL — ABNORMAL LOW (ref 8.9–10.3)
Chloride: 101 mmol/L (ref 98–111)
Creatinine, Ser: 1.1 mg/dL (ref 0.61–1.24)
GFR calc Af Amer: >60 mL/min (ref >60)
GFR calc non Af Amer: >60 mL/min (ref >60)
Glucose, Bld: 127 mg/dL — ABNORMAL HIGH (ref 70–99)
Potassium: 4.2 mmol/L (ref 3.5–5.1)
SODIUM: 137 mmol/L (ref 135–145)

## 2019-02-11 MED ORDER — METHOCARBAMOL 500 MG PO TABS
1000.0000 mg | ORAL_TABLET | Freq: Three times a day (TID) | ORAL | Status: DC
  Administered 2019-02-11 – 2019-02-13 (×7): 1000 mg via ORAL
  Filled 2019-02-11 (×7): qty 2

## 2019-02-11 MED ORDER — POLYETHYLENE GLYCOL 3350 17 G PO PACK: 17.0000 g | PACK | Freq: Every day | ORAL | Status: DC | PRN

## 2019-02-11 MED ORDER — DOCUSATE SODIUM 100 MG PO CAPS
100.0000 mg | ORAL_CAPSULE | Freq: Two times a day (BID) | ORAL | Status: DC
  Administered 2019-02-11 – 2019-02-13 (×5): 100 mg via ORAL
  Filled 2019-02-11 (×5): qty 1

## 2019-02-11 MED ORDER — SODIUM CHLORIDE 0.9 % IV SOLN
INTRAVENOUS | Status: DC
  Administered 2019-02-11 – 2019-02-12 (×3): via INTRAVENOUS

## 2019-02-11 MED ORDER — ACETAMINOPHEN 500 MG PO TABS
1000.0000 mg | ORAL_TABLET | Freq: Four times a day (QID) | ORAL | Status: DC
  Administered 2019-02-11 – 2019-02-13 (×8): 1000 mg via ORAL
  Filled 2019-02-11 (×9): qty 2
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
## 2019-02-11 NOTE — Progress Notes (Signed)
[facility] Progress Note     Subjective: CC: emesis overnight Patient vomited after eating overnight. Denies nausea currently. Denies abdominal pain or bloating. Not having flatus. Pain with inspiration and movement in R rib cage, pulling 2000 on IS. Denies numbness or tingling. Sitting up in chair this AM. Lives at home with his wife and also has a brother who may be able to help some at home.   Objective: Vital signs in last 24 hours: Temp:  [97.9 F (36.6 C)-98.5 F (36.9 C)] 98.5 F (36.9 C) (02/26 0400) Pulse Rate:  [59-99] 93 (02/26 0530) Resp:  [10-22] 18 (02/26 0530) BP: (114-153)/(76-136) 121/76 (02/26 0200) SpO2:  [92 %-100 %] 96 % (02/26 0530) Last BM Date: 02/08/19  Intake/Output from previous day: 02/25 0701 - 02/26 0700 In: 1355 [P.O.:30; I.V.:1275; IV Piggyback:50] Out: 900 [Urine:900] Intake/Output this shift: Total I/O In: -  Out: 650 [Urine:650]  PE: Gen:  Alert, NAD, pleasant, sitting in chair with TLSO on  Card:  Regular rate and rhythm Pulm:  Normal effort, clear to auscultation bilaterally Abd: Soft, non-tender, mildly distended, BS hypoactive Skin: warm and dry, no rashes; multiple lacerations to posterior LLE, steri-strips intact with minimal serosanguinous drainage Psych: A&Ox4  Lab Results:  Recent Labs    02/09/19 2100 02/10/19 0332  WBC 13.6* 6.4  HGB 15.4 13.0  HCT 43.2 38.3*  PLT 134* 194   BMET Recent Labs    02/10/19 0332 02/11/19 0322  NA 135 137  K 4.0 4.2  CL 100 101  CO2 23 27  GLUCOSE 128* 127*  BUN 12 16  CREATININE 1.04 1.10  CALCIUM 9.0 8.8*   PT/INR No results for input(s): LABPROT, INR in the last 72 hours. CMP     Component Value Date/Time   NA 137 02/11/2019 0322   K 4.2 02/11/2019 0322   CL 101 02/11/2019 0322   CO2 27 02/11/2019 0322   GLUCOSE 127 (H) 02/11/2019 0322   BUN 16 02/11/2019 0322   CREATININE 1.10 02/11/2019 0322   CREATININE 1.06 08/30/2017 0916   CALCIUM 8.8 (L)  02/11/2019 0322   PROT 7.2 02/09/2019 2100   ALBUMIN 4.6 02/09/2019 2100   AST 74 (H) 02/09/2019 2100   ALT 70 (H) 02/09/2019 2100   ALKPHOS 79 02/09/2019 2100   BILITOT 1.1 02/09/2019 2100   GFRNONAA >60 02/11/2019 0322   GFRAA >60 02/11/2019 0322   Lipase  No results found for: LIPASE     Studies/Results: Dg Chest 1 View  Result Date: 02/10/2019 CLINICAL DATA:  Chest, rib, and back pain after crush injury. EXAM: CHEST  1 VIEW COMPARISON:  01/11/2016 FINDINGS: Shallow inspiration. Mild cardiac enlargement. No vascular congestion, edema, or consolidation. No blunting of costophrenic angles. No pneumothorax. Mediastinal contours appear intact. Degenerative changes in the spine. IMPRESSION: No active disease. Electronically Signed   By: Lucienne Capers M.D.   On: 02/10/2019 00:21   Dg Cervical Spine 2 Or 3 Views  Result Date: 02/09/2019 CLINICAL DATA:  Initial evaluation for acute back pain status post trauma, crush injury. EXAM: CERVICAL SPINE - 2-3 VIEW COMPARISON:  None. FINDINGS: Mild straightening of the normal cervical lordosis. No listhesis or subluxation. Vertebral body heights maintained with no acute or chronic fracture identified. Normal C1-2 articulations are preserved in the dens is intact. No prevertebral soft tissue swelling. Bulky anterior endplate osteophytic spurring seen throughout the cervical spine. Visualized soft tissues of the neck demonstrate no acute finding. Partially visualized lung apices are grossly  clear. IMPRESSION: 1. No radiographic evidence for acute traumatic injury within the cervical spine. 2. Prominent anterior bulky endplate osteophytic spurring throughout the cervical spine. Electronically Signed   By: Jeannine Boga M.D.   On: 02/09/2019 18:23   Dg Thoracic Spine 2 View  Result Date: 02/09/2019 CLINICAL DATA:  Initial evaluation for acute back pain status post trauma, crush injury. EXAM: THORACIC SPINE 2 VIEWS COMPARISON:  None. FINDINGS:  Vertebral bodies normally aligned with preservation of the normal thoracic kyphosis. No listhesis or malalignment. Mild height loss with associated Schmorl's node at the superior endplate of T6 is chronic and likely degenerative in nature. Mild scattered endplate osteophytic spurring. No appreciable soft tissue abnormality. IMPRESSION: No radiographic evidence for acute traumatic injury within the thoracic spine. Electronically Signed   By: Jeannine Boga M.D.   On: 02/09/2019 18:26   Dg Lumbar Spine Complete  Result Date: 02/09/2019 CLINICAL DATA:  Initial evaluation for acute back pain status post trauma, crush injury. EXAM: LUMBAR SPINE - COMPLETE 4+ VIEW COMPARISON:  None. FINDINGS: 5 lumbar type non-rib-bearing vertebral bodies. Normal alignment with preservation of the normal lumbar lordosis. No listhesis. Compression deformity involving the superior endplate of L4 with mild 20% height loss without bony retropulsion, suspected to be acute in nature. Vertebral body heights otherwise maintained. Visualized sacrum and pelvis intact. SI joints approximated. Anterior endplate osteophytic spurring seen at L2-3 through L4-5. No acute soft tissue abnormality. IMPRESSION: Compression deformity involving the superior endplate of L4 with up to 20% height loss without bony retropulsion. While this finding is somewhat age indeterminate, this is favored to be acute in nature. Correlation with physical exam for possible pain at this location recommended. Electronically Signed   By: Jeannine Boga M.D.   On: 02/09/2019 18:30   Ct Chest W Contrast  Result Date: 02/09/2019 CLINICAL DATA:  53 year old male with blunt trauma to the chest. Patient complaining of lower back pain. EXAM: CT CHEST, ABDOMEN, AND PELVIS WITH CONTRAST TECHNIQUE: Multidetector CT imaging of the chest, abdomen and pelvis was performed following the standard protocol during bolus administration of intravenous contrast. CONTRAST:  128mL  OMNIPAQUE IOHEXOL 300 MG/ML  SOLN COMPARISON:  Lumbar spine CT dated 02/09/2019 and chest radiograph dated 02/09/2019 FINDINGS: CT CHEST FINDINGS Cardiovascular: Top-normal cardiac size. No pericardial effusion. The thoracic aorta is unremarkable. The visualized origins of the great vessels of the aortic arch appear patent. The central pulmonary arteries are patent as visualized. Mediastinum/Nodes: There is no hilar or mediastinal adenopathy. The esophagus and the thyroid gland are grossly unremarkable. No mediastinal fluid collection. Lungs/Pleura: There is a small right pneumothorax (less than 20%). There is no focal consolidation, or pleural effusion. The central airways are patent. Musculoskeletal: Multiple nondisplaced posterior right rib fractures involving the seventh-ninth ribs. There is minimal age indeterminate compression fracture of the superior endplate of T6, likely chronic. Clinical correlation is recommended. No retropulsed fragment. Minimally displaced fracture of the right transverse process of T12 at the costovertebral junction. Nondisplaced linear fracture of the right transverse process of T10 and T11. CT ABDOMEN PELVIS FINDINGS No intra-abdominal free air or free fluid. Hepatobiliary: No focal liver abnormality is seen. No gallstones, gallbladder wall thickening, or biliary dilatation. Pancreas: Unremarkable. No pancreatic ductal dilatation or surrounding inflammatory changes. Spleen: Normal in size without focal abnormality. Adrenals/Urinary Tract: Adrenal glands are unremarkable. Kidneys are normal, without renal calculi, focal lesion, or hydronephrosis. Bladder is unremarkable. Stomach/Bowel: There is distal colonic diverticulosis without active inflammatory changes. There is no bowel obstruction  or active inflammation. Normal appendix. Vascular/Lymphatic: No significant vascular findings are present. No enlarged abdominal or pelvic lymph nodes. Reproductive: The prostate and seminal  vesicles are grossly unremarkable. Other: Thickening and nodularity of the subcutaneous soft tissues at the umbilicus likely postsurgical scarring. No fluid collection. Musculoskeletal: Age indeterminate fracture of the superior endplate of L4 with approximately 30% loss of vertebral body height. Correlation with clinical exam and point tenderness recommended. Mildly displaced fractures of the right transverse processes of the L1-L4. IMPRESSION: 1. Small right pneumothorax. 2. Multiple nondisplaced right posterior rib fractures as well as nondisplaced right thoracic transverse process fractures. Multilevel displaced lumbar spine right transverse process fractures. 3. Age indeterminate mild compression fractures of T6 and L4. Correlation with clinical exam and point tenderness recommended. 4. No acute/traumatic intra-abdominal or pelvic solid organ or hollow viscus injury. These results were called by telephone at the time of interpretation on 02/09/2019 at 10:44 pm to Dr. Varney Biles , who verbally acknowledged these results. Electronically Signed   By: Anner Crete M.D.   On: 02/09/2019 22:48   Ct Abdomen Pelvis W Contrast  Result Date: 02/09/2019 CLINICAL DATA:  53 year old male with blunt trauma to the chest. Patient complaining of lower back pain. EXAM: CT CHEST, ABDOMEN, AND PELVIS WITH CONTRAST TECHNIQUE: Multidetector CT imaging of the chest, abdomen and pelvis was performed following the standard protocol during bolus administration of intravenous contrast. CONTRAST:  133mL OMNIPAQUE IOHEXOL 300 MG/ML  SOLN COMPARISON:  Lumbar spine CT dated 02/09/2019 and chest radiograph dated 02/09/2019 FINDINGS: CT CHEST FINDINGS Cardiovascular: Top-normal cardiac size. No pericardial effusion. The thoracic aorta is unremarkable. The visualized origins of the great vessels of the aortic arch appear patent. The central pulmonary arteries are patent as visualized. Mediastinum/Nodes: There is no hilar or  mediastinal adenopathy. The esophagus and the thyroid gland are grossly unremarkable. No mediastinal fluid collection. Lungs/Pleura: There is a small right pneumothorax (less than 20%). There is no focal consolidation, or pleural effusion. The central airways are patent. Musculoskeletal: Multiple nondisplaced posterior right rib fractures involving the seventh-ninth ribs. There is minimal age indeterminate compression fracture of the superior endplate of T6, likely chronic. Clinical correlation is recommended. No retropulsed fragment. Minimally displaced fracture of the right transverse process of T12 at the costovertebral junction. Nondisplaced linear fracture of the right transverse process of T10 and T11. CT ABDOMEN PELVIS FINDINGS No intra-abdominal free air or free fluid. Hepatobiliary: No focal liver abnormality is seen. No gallstones, gallbladder wall thickening, or biliary dilatation. Pancreas: Unremarkable. No pancreatic ductal dilatation or surrounding inflammatory changes. Spleen: Normal in size without focal abnormality. Adrenals/Urinary Tract: Adrenal glands are unremarkable. Kidneys are normal, without renal calculi, focal lesion, or hydronephrosis. Bladder is unremarkable. Stomach/Bowel: There is distal colonic diverticulosis without active inflammatory changes. There is no bowel obstruction or active inflammation. Normal appendix. Vascular/Lymphatic: No significant vascular findings are present. No enlarged abdominal or pelvic lymph nodes. Reproductive: The prostate and seminal vesicles are grossly unremarkable. Other: Thickening and nodularity of the subcutaneous soft tissues at the umbilicus likely postsurgical scarring. No fluid collection. Musculoskeletal: Age indeterminate fracture of the superior endplate of L4 with approximately 30% loss of vertebral body height. Correlation with clinical exam and point tenderness recommended. Mildly displaced fractures of the right transverse processes of the  L1-L4. IMPRESSION: 1. Small right pneumothorax. 2. Multiple nondisplaced right posterior rib fractures as well as nondisplaced right thoracic transverse process fractures. Multilevel displaced lumbar spine right transverse process fractures. 3. Age indeterminate mild compression fractures of  T6 and L4. Correlation with clinical exam and point tenderness recommended. 4. No acute/traumatic intra-abdominal or pelvic solid organ or hollow viscus injury. These results were called by telephone at the time of interpretation on 02/09/2019 at 10:44 pm to Dr. Varney Biles , who verbally acknowledged these results. Electronically Signed   By: Anner Crete M.D.   On: 02/09/2019 22:48   Ct L-spine No Charge  Result Date: 02/09/2019 CLINICAL DATA:  53 year old male with trauma. EXAM: CT LUMBAR SPINE WITHOUT CONTRAST TECHNIQUE: Multidetector CT imaging of the lumbar spine was performed without intravenous contrast administration. Multiplanar CT image reconstructions were also generated. COMPARISON:  CT of the abdomen pelvis dated 02/09/2019 FINDINGS: Segmentation: 5 lumbar type vertebrae. Alignment: Normal. Vertebrae: Age indeterminate compression fracture of the superior endplate of L4 with approximately 30% loss of vertebral body height. This is concerning for an acute fracture. Correlation with clinical exam and point tenderness recommended. No retropulsed fragment. Mildly displaced fractures of the right L1-L4 transverse processes. Paraspinal and other soft tissues: Small amount of hematoma in the right psoas muscle. Disc levels: No significant degenerative changes. IMPRESSION: 1. Age indeterminate, likely acute, compression fracture of the superior endplate of L4. Clinical correlation is recommended. 2. Mildly displaced fractures of the right L1-L4 transverse processes. Electronically Signed   By: Anner Crete M.D.   On: 02/09/2019 22:54   Dg Chest Port 1 View  Result Date: 02/10/2019 CLINICAL DATA:   Right-sided pneumothorax from a crush injury yesterday. EXAM: PORTABLE CHEST 1 VIEW COMPARISON:  Chest 02/09/2019. CT chest 02/09/2019 FINDINGS: Mild cardiac enlargement. No vascular congestion, edema, or consolidation. Right pneumothorax seen at CT is not visualized radiographically. No blunting of costophrenic angles. Mediastinal contours appear intact. IMPRESSION: Cardiac enlargement. No evidence of active pulmonary disease. Right pneumothorax seen at CT is not visualized radiographically. Electronically Signed   By: Lucienne Capers M.D.   On: 02/10/2019 03:35   Dg Femur Min 2 Views Left  Result Date: 02/10/2019 CLINICAL DATA:  Left upper leg pain after being crushed by piece of machinery tonight at work. EXAM: LEFT FEMUR 2 VIEWS COMPARISON:  CT abdomen and pelvis 02/09/2019. FINDINGS: Degenerative changes in the left hip and left knee. No acute fracture or dislocation of the left hip. Left femur appears intact. Vascular calcifications. Residual contrast material in the bladder. IMPRESSION: No acute bony abnormalities. Degenerative changes in the hip and knee. Electronically Signed   By: Lucienne Capers M.D.   On: 02/10/2019 00:20    Anti-infectives: Anti-infectives (From admission, onward)   None       Assessment/Plan 53 year old male status post crush injury Right 7-9 rib fractures w/ small PTX - CXR yesterday with resolution in PTX, multimodal pain control, IS, pulmonary toiletry T6 and L4 superior endplate fractures -  NS feels may be chronic, TLSO for comfort, PT/OT R T10-12, L1-4 transverse process fractures - TLSO prn, pain control, PT/OT LLE lacerations - Repaired on 2/25 in ED by EDP Hypertension - home meds, may be somwhat elevated secondary to pain as well Hyperlipidemia Type II Diabetes - SSI GERD - home meds Insomnia - On zolpidem 12.5mg  CR qhs PRN; Continue zolpidem 5mg  qhs  FEN: HH diet, IVF VTE: SCD's, lovenox ID: No abx indicated at this time Foley: None Follow  up: NS PRN  DISPO: Bowel regimen and continue to mobilize with PT/OT. Repeat AM labs. Possibly home in the next 1-2 days.   LOS: 1 day    Brigid Re , [facility] 02/11/2019,  8:51 AM Pager: [phone]

## 2019-02-12 LAB — BASIC METABOLIC PANEL
ANION GAP: 6 (ref 5–15)
BUN: 11 mg/dL (ref 6–20)
CO2: 25 mmol/L (ref 22–32)
Calcium: 8.5 mg/dL — ABNORMAL LOW (ref 8.9–10.3)
Chloride: 107 mmol/L (ref 98–111)
Creatinine, Ser: 0.89 mg/dL (ref 0.61–1.24)
GFR calc Af Amer: >60 mL/min (ref >60)
GFR calc non Af Amer: >60 mL/min (ref >60)
Glucose, Bld: 136 mg/dL — ABNORMAL HIGH (ref 70–99)
Potassium: 3.6 mmol/L (ref 3.5–5.1)
Sodium: 138 mmol/L (ref 135–145)

## 2019-02-12 LAB — GLUCOSE, CAPILLARY
Glucose-Capillary: 140 mg/dL — ABNORMAL HIGH (ref 70–99)
Glucose-Capillary: 134 mg/dL — ABNORMAL HIGH (ref 70–99)
Glucose-Capillary: 102 mg/dL — ABNORMAL HIGH (ref 70–99)
Glucose-Capillary: 89 mg/dL (ref 70–99)

## 2019-02-12 MED ORDER — POLYETHYLENE GLYCOL 3350 17 G PO PACK
17.0000 g | PACK | Freq: Every day | ORAL | Status: DC
  Administered 2019-02-13: 17 g via ORAL
  Filled 2019-02-12: qty 1

## 2019-02-12 NOTE — Progress Notes (Signed)
Occupational Therapy Treatment Patient Details Name: Daniel [name] MRN: [id number] DOB: 09-25-1966 Today's Date: 02/12/2019    History of present illness 53 yo admitted after being hit by machinery at work in the back with Rt 7-9 rib fx, T6 fx, T10&11 TVP fx, L4 fx, T6 and L4 compression fx, L1-4 TVP fx. PMHx: HTN, HLD, DM, asthma, depression   OT comments  Pt progressing towards OT goals this session. Pt continues to be unable to perform figure 4 technique for dressing, continues to be mod A for LB ADL. Min guard for toilet transfers, and in room mobility for ADL. MIn guard for sink level grooming and educated in compensatory strategies for standing ADL (cup method for oral care, setting up on dominant side to prevent twisting). Current POC remains appropriate.    Follow Up Recommendations  No OT follow up;Supervision/Assistance - 24 hour    Equipment Recommendations  3 in 1 bedside commode    Recommendations for Other Services      Precautions / Restrictions Precautions Precautions: Back;Fall Precaution Booklet Issued: Yes (comment) Precaution Comments: able to recall 3/3 precautions Required Braces or Orthoses: Spinal Brace Spinal Brace: Lumbar corset;Applied in sitting position Restrictions Weight Bearing Restrictions: No       Mobility Bed Mobility Overal bed mobility: Needs Assistance Bed Mobility: Sit to Sidelying;Rolling Rolling: Min guard       Sit to sidelying: Min assist General bed mobility comments: min A for BLE back into bed  Transfers Overall transfer level: Needs assistance Equipment used: Rolling walker (2 wheeled) Transfers: Sit to/from Stand Sit to Stand: Min guard         General transfer comment: cues for hand placement, increased time to rise from elevated bed    Balance Overall balance assessment: No apparent balance deficits (not formally assessed)                                         ADL either performed or  assessed with clinical judgement   ADL Overall ADL's : Needs assistance/impaired     Grooming: Min guard;Standing;Oral care;Wash/dry face Grooming Details (indicate cue type and reason): sink, leans against sink         Upper Body Dressing : Minimal assistance;Sitting Upper Body Dressing Details (indicate cue type and reason): min assist for brace mgmt, cues for sequencing Lower Body Dressing: Moderate assistance;Sit to/from stand;Cueing for compensatory techniques Lower Body Dressing Details (indicate cue type and reason): unable to complete figure 4 technique, min guard in standing  Toilet Transfer: Min guard;Ambulation;RW;BSC Toilet Transfer Details (indicate cue type and reason): BCS over toilet Toileting- Clothing Manipulation and Hygiene: Min guard;Sit to/from stand       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     Praxis      Cognition Arousal/Alertness: Awake/alert Behavior During Therapy: WFL for tasks assessed/performed Overall Cognitive Status: Within Functional Limits for tasks assessed                                          Exercises     Shoulder Instructions       General Comments      Pertinent Vitals/ Pain       Pain  Assessment: Faces Faces Pain Scale: Hurts even more Pain Location: back during mobility Pain Descriptors / Indicators: Sharp;Grimacing Pain Intervention(s): Monitored during session;Limited activity within patient's tolerance;Repositioned  Home Living                                          Prior Functioning/Environment              Frequency  Min 2X/week        Progress Toward Goals  OT Goals(current goals can now be found in the care plan section)  Progress towards OT goals: Progressing toward goals  Acute Rehab OT Goals Patient Stated Goal: return to work and fishing OT Goal Formulation: With patient Time For Goal  Achievement: 02/25/19 Potential to Achieve Goals: Good  Plan Discharge plan remains appropriate;Frequency remains appropriate    Co-evaluation                 AM-PAC OT "6 Clicks" Daily Activity     Outcome Measure   Help from another person eating meals?: None Help from another person taking care of personal grooming?: A Little Help from another person toileting, which includes using toliet, bedpan, or urinal?: A Little Help from another person bathing (including washing, rinsing, drying)?: A Little Help from another person to put on and taking off regular upper body clothing?: None Help from another person to put on and taking off regular lower body clothing?: A Lot 6 Click Score: 19    End of Session    OT Visit Diagnosis: Other abnormalities of gait and mobility (R26.89);Pain Pain - part of body: (ribs, back)   Activity Tolerance     Patient Left     Nurse Communication          Time: 3754-3606 OT Time Calculation (min): 28 min  Charges: OT General Charges $OT Visit: 1 Visit OT Treatments $Self Care/Home Management : 23-37 mins  Hulda Humphrey OTR/L Acute Rehabilitation Services Pager: [phone] Office: [place] 02/12/2019, 3:45 PM

## 2019-02-12 NOTE — Progress Notes (Signed)
Physical Therapy Treatment Patient Details Name: Daniel [name] MRN: [id number] DOB: [birth year] Today's Date: 02/12/2019    History of Present Illness 53 yo admitted after being hit by machinery at work in the back with Rt 7-9 rib fx, T6 fx, T10&11 TVP fx, L4 fx, T6 and L4 compression fx, L1-4 TVP fx. PMHx: HTN, HLD, DM, asthma, depression    PT Comments    Pt pleasant, very stoic and trying to hide his pain and states he has been trying not to take pain medication. Pt also reports having not slept since admission with pain and fatigue wearing on him and encouraged medication as needed to manage symptoms. Pt educated for brace wear with min assist to don brace, precautions, transfers and function. Encouraged ambulation with nursing during the day.     Follow Up Recommendations  Home health PT     Equipment Recommendations  Rolling walker with 5" wheels;3in1 (PT)    Recommendations for Other Services       Precautions / Restrictions Precautions Precautions: Back;Fall Precaution Comments: able to recall 2/3 precautions with educated for no lifting  Required Braces or Orthoses: Spinal Brace Spinal Brace: Lumbar corset;Applied in sitting position    Mobility  Bed Mobility Overal bed mobility: Needs Assistance Bed Mobility: Rolling;Sidelying to Sit Rolling: Min guard Sidelying to sit: Min assist       General bed mobility comments: cues for sequence with assist to elevate trunk from surface  Transfers Overall transfer level: Needs assistance   Transfers: Sit to/from Stand Sit to Stand: Min guard         General transfer comment: cues for hand placement, increased time to rise from elevated bed  Ambulation/Gait Ambulation/Gait assistance: Min guard Gait Distance (Feet): 200 Feet Assistive device: Rolling walker (2 wheeled) Gait Pattern/deviations: Step-through pattern;Decreased stride length   Gait velocity interpretation: <1.8 ft/sec, indicate of risk for  recurrent falls General Gait Details: slow cautious steps. Cues for posture   Stairs Stairs: Yes Stairs assistance: Supervision Stair Management: Alternating pattern;Forwards;One rail Right;One rail Left;Step to pattern Number of Stairs: 11 General stair comments: pt ascended with alternating pattern slowly with left rail then switched to right simulating home. STep to pattern for descent. Slow and cautious but stable with stair navigation   Wheelchair Mobility    Modified Rankin (Stroke Patients Only)       Balance Overall balance assessment: No apparent balance deficits (not formally assessed)                                          Cognition Arousal/Alertness: Awake/alert Behavior During Therapy: WFL for tasks assessed/performed Overall Cognitive Status: Within Functional Limits for tasks assessed                                        Exercises      General Comments        Pertinent Vitals/Pain Pain Score: 7  Pain Location: back during mobility Pain Descriptors / Indicators: Sharp;Grimacing Pain Intervention(s): Limited activity within patient's tolerance;Monitored during session;Premedicated before session;Repositioned    Home Living                      Prior Function            PT Goals (  current goals can now be found in the care plan section) Progress towards PT goals: Progressing toward goals    Frequency           PT Plan Current plan remains appropriate    Co-evaluation              AM-PAC PT "6 Clicks" Mobility   Outcome Measure  Help needed turning from your back to your side while in a flat bed without using bedrails?: A Little Help needed moving from lying on your back to sitting on the side of a flat bed without using bedrails?: A Little Help needed moving to and from a bed to a chair (including a wheelchair)?: A Little Help needed standing up from a chair using your arms (e.g.,  wheelchair or bedside chair)?: A Little Help needed to walk in hospital room?: A Little Help needed climbing 3-5 steps with a railing? : A Little 6 Click Score: 18    End of Session Equipment Utilized During Treatment: Back brace Activity Tolerance: Patient tolerated treatment well Patient left: in chair;with call bell/phone within reach Nurse Communication: Mobility status PT Visit Diagnosis: Other abnormalities of gait and mobility (R26.89);Difficulty in walking, not elsewhere classified (R26.2)     Time: 0729-0802 PT Time Calculation (min) (ACUTE ONLY): 33 min  Charges:  $Gait Training: 8-22 mins $Therapeutic Activity: 8-22 mins                     [place], PT Acute Rehabilitation Services Pager: [phone] Office: [place] 02/12/2019, 8:10 AM

## 2019-02-12 NOTE — Progress Notes (Signed)
Left message for Meredeth Ide, [facility] Case Manager, informing her of possible discharge this afternoon.    Reinaldo Raddle, RN, BSN  Trauma/Neuro ICU Case Manager [phone]

## 2019-02-12 NOTE — Discharge Instructions (Signed)
How to Use a Back Brace  A back brace is a form-fitting device that wraps around your trunk to support your lower back, abdomen, and hips. You may need to wear a back brace to relieve back pain or to correct a medical condition related to the back, such as abnormal curvature of the spine (scoliosis). A back brace can maintain or correct the shape of the spine and prevent a spinal problem from getting worse. A back brace can also take pressure off the layers of tissue (disks) between the bones of the spine (vertebrae). You may need a back brace to keep your back and spine in place while you heal from an injury or recover from surgery. Back braces can be either plastic (rigid brace) or soft elastic (dynamic brace). A rigid brace usually covers both the front and back of the entire upper body. A soft brace may cover only the lower back and abdomen and may fasten with self-adhesive elastic straps. Your health care provider will recommend the proper brace for your needs and medical condition. What are the risks?  A back brace may not help if you do not wear it as directed by your health care provider. Be sure to wear the brace exactly as instructed in order to prevent further back problems.  Wearing the brace may be uncomfortable at first. You may have trouble sleeping with it on. It may also be hard for you to do certain activities while wearing the brace. How to use a back brace Different types of braces will have different instructions for use. Follow instructions from your health care provider about:  How to put on the brace.  When and how often to wear the brace. In some cases, braces may need to be worn for long stretches of time. For example, a brace may need to be worn for 16-23 hours a day when used for scoliosis.  How to take off the brace.  Any safety tips you should follow when wearing the brace. This may include: ? Moving carefully while wearing the brace. The brace restricts your movement  and could lead to additional injuries. ? Using a cane or walker for support if you feel unsteady. ? Sitting in high, firm chairs. It may be difficult to stand up from low, soft chairs. How to care for a back brace  Do not let the back brace get wet. Typically, you will remove the brace for bathing and then put it back on afterward.  If you have a rigid brace, be sure to store it in a safe place when you are not wearing it. This will help to prevent damage.  Clean or wash the back brace with mild soap and water as told by your health care provider. Contact a health care provider if:  Your brace gets damaged.  You have pain or discomfort when wearing the back brace.  Your back pain is getting worse or is not improving over time. This information is not intended to replace advice given to you by your health care provider. Make sure you discuss any questions you have with your health care provider. Document Released: 11/22/2011 Document Revised: 12/29/2015 Document Reviewed: 07/27/2015 Elsevier Interactive Patient Education  2019 [employer].

## 2019-02-12 NOTE — Progress Notes (Signed)
[facility]/Trauma Progress Note   Subjective: CC: Back pain  Patient is sitting up in chair in back brace. Worked with therapy this morning and did the stairs. Reports 6/10 back pain but it is time for pain medication. Denies dizziness or lightheadedness when ambulating. Denies nausea, vomiting or abdominal pain. +flatus but no BM. Denies fever or chills. Pulling ~2000 on IS, reports rib pain.   Objective: Vital signs in last 24 hours: Temp:  [98 F (36.7 C)-98.3 F (36.8 C)] 98.3 F (36.8 C) (02/27 0800) Pulse Rate:  [76-91] 91 (02/27 0500) Resp:  [12-21] 21 (02/27 0500) BP: (130-145)/(71-92) 145/89 (02/27 0500) SpO2:  [95 %-99 %] 95 % (02/27 0500) Last BM Date: 02/08/19  Intake/Output from previous day: 02/26 0701 - 02/27 0700 In: 1466.3 [I.V.:1466.3] Out: 2150 [Urine:2150] Intake/Output this shift: No intake/output data recorded.  PE: Gen:  Alert, NAD, pleasant, cooperative, sitting up in chair Card:  RRR, no M/G/R heard, 2 + radial and pedal pulses bilaterally Pulm:  CTA, no W/R/R, effort normal, pulling ~2000 on IS Abd: Soft, NT/ND, hypoactive bowel sounds Skin: no rashes noted, warm and dry, multiple lacerations to posterior LLE Extremities: MAE with equal strength Neuro: Alert and oriented x 4, MAE with equal strength  Lab Results:  Recent Labs    02/09/19 2100 02/10/19 0332  WBC 13.6* 6.4  HGB 15.4 13.0  HCT 43.2 38.3*  PLT 134* 194   BMET Recent Labs    02/11/19 0322 02/12/19 0523  NA 137 138  K 4.2 3.6  CL 101 107  CO2 27 25  GLUCOSE 127* 136*  BUN 16 11  CREATININE 1.10 0.89  CALCIUM 8.8* 8.5*   PT/INR No results for input(s): LABPROT, INR in the last 72 hours. CMP     Component Value Date/Time   NA 138 02/12/2019 0523   K 3.6 02/12/2019 0523   CL 107 02/12/2019 0523   CO2 25 02/12/2019 0523   GLUCOSE 136 (H) 02/12/2019 0523   BUN 11 02/12/2019 0523   CREATININE 0.89 02/12/2019 0523   CREATININE 1.06 08/30/2017 0916   CALCIUM 8.5 (L) 02/12/2019 0523   PROT 7.2 02/09/2019 2100   ALBUMIN 4.6 02/09/2019 2100   AST 74 (H) 02/09/2019 2100   ALT 70 (H) 02/09/2019 2100   ALKPHOS 79 02/09/2019 2100   BILITOT 1.1 02/09/2019 2100   GFRNONAA >60 02/12/2019 0523   GFRAA >60 02/12/2019 0523   Assessment/Plan 53 year old male status post crush injury Small right pneumothorax: CXR 2/25 right pneumothorax not seen; Pulling ~2000 on IS; Continue pulmonary toilet with IS, ambulate, PT/OT Right 7 through 9 rib fractures: See above; Pain control T6 superior endplate fracture: NS consult, may mobilize in TLSO, f/u in office PRN; Pain control, see above, PT/OT T12 transverse process fracture: See above. Right T10, T11 transverse process fracture: See above.  Superior endplate of L4 fracture: See above. Mild compression fracture of T6and L4-age indeterminate: See above. Right L1-L4 transverse process fracture: See above.  LLE lacerations: Repaired on 2/25 in ED by EDP Hypertension: On carvedilol 12.5mg  BID and losartan 100mg  daily PTA, BP 130-145/76-92; Continue carvedilol and losartan Hyperlipidemia: On niacin 500mg  and fish oil/omega3 daily PTA Type II Diabetes: On metformin 850mg  BID PTA, CBG 88-178; Continue CBGs ACHS and SSI GERD: On pantoprazole 40mg  daily PTA; Continue pantoprazole Insomnia: On zolpidem 12.5mg  CR qhs PRN; Continue zolpidem 5mg  qhs  FEN: NSL PIV, Cr improved to 0.89 today; Heart healthy diet; add Miralax daily VTE: SCD's, lovenox ID:  No active issues, afebrile Foley: None Follow up: NS PRN  DISPO: PT recommending HHPT, no OT f/u; Likely discharge this afternoon  LOS: 2 days   Larence Penning , NP-S 02/12/2019, 9:37 AM  CRT improved Did stairs with PT Passed a lot of gas Will check this PM for possible D/C  Patient examined and I agree with the assessment and plan  Georganna Skeans, MD, MPH, FACS Trauma: [phone] General Surgery: [phone]  02/12/2019 9:54 AM

## 2019-02-13 LAB — GLUCOSE, CAPILLARY
Glucose-Capillary: 122 mg/dL — ABNORMAL HIGH (ref 70–99)
Glucose-Capillary: 118 mg/dL — ABNORMAL HIGH (ref 70–99)

## 2019-02-13 MED ORDER — POLYETHYLENE GLYCOL 3350 17 G PO PACK: 17.0000 g | PACK | Freq: Every day | ORAL | 0 refills | Status: DC

## 2019-02-13 MED ORDER — METHOCARBAMOL 500 MG PO TABS: 1000.0000 mg | ORAL_TABLET | Freq: Three times a day (TID) | ORAL | 0 refills | Status: DC

## 2019-02-13 MED ORDER — ACETAMINOPHEN 500 MG PO TABS: 1000.0000 mg | ORAL_TABLET | Freq: Four times a day (QID) | ORAL | 0 refills | Status: DC

## 2019-02-13 MED ORDER — OXYCODONE HCL 5 MG PO TABS: 5.0000 mg | ORAL_TABLET | ORAL | 0 refills | Status: DC | PRN

## 2019-02-13 MED ORDER — DOCUSATE SODIUM 100 MG PO CAPS: 100.0000 mg | ORAL_CAPSULE | Freq: Two times a day (BID) | ORAL | 0 refills | Status: DC

## 2019-02-13 NOTE — Progress Notes (Signed)
Pt discharged; all questions and concerns addressed. Pt assisted in personal vehicle, wife and son present.

## 2019-02-13 NOTE — Discharge Summary (Signed)
Patient ID: Daniel [name] [id number] 04/12/1966 53 y.o.  Admit date: 02/09/2019 Discharge date: 02/13/2019  Admitting Diagnosis: Crush injury Small right pneumothorax Right 7 through 9 rib fractures T6 superior endplate fracture F16 transverse process fracture Right T10, T11 transverse process fracture Superior endplate of L4 fracture Mild compression fracture of T6and L4-age indeterminate Right L1-L4 transverse process fracture LLE lacerations Hypertension Hyperlipidemia Type II Diabetes GERD Insomnia  Discharge Diagnosis Patient Active Problem List   Diagnosis Date Noted  . Crush accident 02/10/2019  . Anxiety 02/24/2017  . HSV infection 02/24/2017  . H/O Clostridium difficile infection   . PCP NOTES >>>>> 09/12/2015  . Annual physical exam 05/02/2011  . DM II (diabetes mellitus, type II), controlled ([place]) 03/05/2011  . GERD 01/23/2011  . Hyperlipidemia 04/28/2008  . Asthma 08/26/2007  . Essential hypertension 02/09/2007  . INSOMNIA 02/09/2007  Small right pneumothorax Right 7 through 9 rib fractures T6 superior endplate fracture B84 transverse process fracture Right T10, T11 transverse process fracture Superior endplate of L4 fracture Mild compression fracture of T6and L4-age indeterminate Right L1-L4 transverse process fracture LLE lacerations Hypertension Hyperlipidemia Type II Diabetes GERD Insomnia  Consultants Dr. Newman Pies  Reason for Admission: 53 year old male, with a history of diabetes, hyperlipidemia, hypertension, who comes in status post crush injury.  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  He states that this sat him down and basically folded him in half.  Patient states that since that time he has had mid and lower back pain.  Patient denies any LOC.  Patient was brought to the ER for further  evaluation.  Upon evaluation the ER patient was seen to have multiple spinal fractures, rib fractures, and small pneumothorax.  Trauma surgery and neurosurgery was consulted for further evaluation and management   Procedures none  Hospital Course:  The patient was admitted and found to have all injuries noted above.  He was monitored closely in the progressive unit.  No further progression of his PTX was noted and this resolved on it's own.  Neurosurgery evaluated the patient and felt that no surgical intervention was necessary.  He was placed in a brace for comfort.  PT/OT evaluated the patient and [facility] PT was arranged with multiple pieces of equipment.  On HD 3, the patient was otherwise medically stable for DC home with appropriate follow up arranged.  Will need to see Dr. Arnoldo Morale for clearance from his back fractures of when he can return to work.  Physical Exam: Gen: NAD Heart: regular Lungs: CTAB Abd: soft, NT, ND, +BS Ext: MAE, neurologically intact, left thigh laceration is stable and healing well with steri-strips in place.  Allergies as of 02/13/2019      Reactions   Levofloxacin Other (See Comments)   Tendon rupture at wrist      Medication List    STOP taking these medications   clonazePAM 0.5 MG tablet Commonly known as:  KLONOPIN   cyclobenzaprine 10 MG tablet Commonly known as:  FLEXERIL   valACYclovir 500 MG tablet Commonly known as:  VALTREX     TAKE these medications   acetaminophen 500 MG tablet Commonly known as:  TYLENOL Take 2 tablets (1,000 mg total) by mouth every 6 (six) hours.   aspirin 81 MG tablet Take 81 mg by mouth daily.   carvedilol 12.5 MG tablet Commonly  known as:  COREG Take 1 tablet (12.5 mg total) by mouth 2 (two) times daily with a meal.   docusate sodium 100 MG capsule Commonly known as:  COLACE Take 1 capsule (100 mg total) by mouth 2 (two) times daily.   fish oil-omega-3 fatty acids 1000 MG capsule Take 1 g by mouth daily.    FLAX SEED OIL PO Take 1 each by mouth at bedtime.   losartan 100 MG tablet Commonly known as:  COZAAR Take 1 tablet (100 mg total) by mouth daily.   metFORMIN 850 MG tablet Commonly known as:  GLUCOPHAGE Take 1 tablet (850 mg total) by mouth 2 (two) times daily with a meal.   methocarbamol 500 MG tablet Commonly known as:  ROBAXIN Take 2 tablets (1,000 mg total) by mouth 3 (three) times daily.   multivitamin,tx-minerals tablet Take 1 tablet by mouth daily.   niacin 500 MG tablet Take 500 mg by mouth at bedtime.   ONE TOUCH ULTRA TEST test strip Generic drug:  glucose blood TEST three times a day   ONETOUCH DELICA LANCETS 32R Misc CHECK BLOOD SUGAR NO MORE THAN 3 TIMES A DAY   oxyCODONE 5 MG immediate release tablet Commonly known as:  Oxy IR/ROXICODONE Take 1-2 tablets (5-10 mg total) by mouth every 4 (four) hours as needed.   pantoprazole 40 MG tablet Commonly known as:  PROTONIX Take 1 tablet (40 mg total) by mouth daily before breakfast.   polyethylene glycol packet Commonly known as:  MIRALAX / GLYCOLAX Take 17 g by mouth daily. Start taking on:  February 14, 2019   PROBIOTIC DAILY PO Take 1 tablet by mouth daily.   Red Yeast Rice 600 MG Caps Take 600 mg by mouth daily.   zolpidem 12.5 MG CR tablet Commonly known as:  AMBIEN CR TAKE 1 TABLET(12.5 MG) BY MOUTH AT BEDTIME AS NEEDED FOR SLEEP What changed:  See the new instructions.            Durable Medical Equipment  (From admission, onward)         Start     Ordered   02/12/19 1444  For home use only DME Tub bench  Once     02/12/19 1443   02/11/19 1337  For home use only DME 3 n 1  Once     02/11/19 1337   02/11/19 1337  For home use only DME Walker rolling  Once    Question Answer Comment  Patient needs a walker to treat with the following condition Fracture of thoracic transverse process [facility])   Patient needs a walker to treat with the following condition Lumbar transverse process  fracture [facility])      02/11/19 1337           Follow-up Information    Newman Pies, MD Follow up in 2 week(s).   Specialty:  Neurosurgery Why:  Follow up as needed for back fractures/pain and return to work clearance. Contact information: [address] [place] [address] [phone]        Colon Branch, MD. Call.   Specialty:  Internal Medicine Why:  Call and schedule an appointment for post hospital visit in 1-2 weeks.  Contact information: [address] [place] [address] [phone]        [place]. Call.   Why:  No follow up scheduled. Call as needed.  Contact information: [place]  [place] [address] [phone]          Signed: Saverio Danker, [facility] 02/13/2019, 10:08 AM Pager: [phone]

## 2019-02-13 NOTE — Progress Notes (Signed)
Physical Therapy Treatment Patient Details Name: Daniel [name] MRN: [id number] DOB: [birth year] Today's Date: 02/13/2019    History of Present Illness 53 yo admitted after being hit by machinery at work in the back with Rt 7-9 rib fx, T6 fx, T10&11 TVP fx, L4 fx, T6 and L4 compression fx, L1-4 TVP fx. PMHx: HTN, HLD, DM, asthma, depression    PT Comments    Pt pleasant and agreed to participate with therapy. Pt performed all mobility tasks with modified independence required increased time and rolling walker with minimal cuing from therapy. Pt educated on car transfers, performing daily activities within precautions and activity progression.  Follow Up Recommendations  No PT follow up     Equipment Recommendations  Rolling walker with 5" wheels;3in1 (PT)    Recommendations for Other Services       Precautions / Restrictions Precautions Precautions: Back Precaution Comments: able to recall 3/3 precautions Required Braces or Orthoses: Spinal Brace Spinal Brace: Lumbar corset;Applied in sitting position Restrictions Weight Bearing Restrictions: No    Mobility  Bed Mobility Overal bed mobility: Modified Independent Bed Mobility: Rolling;Sit to Sidelying           General bed mobility comments: mod independent with bed mobility requiring increased time secondary to pain  Transfers Overall transfer level: Modified independent Equipment used: Rolling walker (2 wheeled) Transfers: Sit to/from Stand Sit to Stand: Modified independent (Device/Increase time)            Ambulation/Gait Ambulation/Gait assistance: Modified independent (Device/Increase time) Gait Distance (Feet): 200 Feet Assistive device: Rolling walker (2 wheeled) Gait Pattern/deviations: Step-through pattern;Decreased stride length;Antalgic     General Gait Details: pt ambulated mod I with RW and slow gait speed due to pain and being cautious    Stairs             Wheelchair Mobility     Modified Rankin (Stroke Patients Only)       Balance Overall balance assessment: No apparent balance deficits (not formally assessed)                                          Cognition Arousal/Alertness: Awake/alert Behavior During Therapy: WFL for tasks assessed/performed Overall Cognitive Status: Within Functional Limits for tasks assessed                                        Exercises      General Comments        Pertinent Vitals/Pain Faces Pain Scale: Hurts little more Pain Location: back Pain Descriptors / Indicators: Aching;Grimacing Pain Intervention(s): Monitored during session;Premedicated before session;Repositioned    Home Living                      Prior Function            PT Goals (current goals can now be found in the care plan section) Progress towards PT goals: Progressing toward goals    Frequency    Min 5X/week      PT Plan Discharge plan needs to be updated    Co-evaluation              AM-PAC PT "6 Clicks" Mobility   Outcome Measure  Help needed turning from your back to your side while in  a flat bed without using bedrails?: None Help needed moving from lying on your back to sitting on the side of a flat bed without using bedrails?: None Help needed moving to and from a bed to a chair (including a wheelchair)?: None Help needed standing up from a chair using your arms (e.g., wheelchair or bedside chair)?: None Help needed to walk in hospital room?: None Help needed climbing 3-5 steps with a railing? : A Little 6 Click Score: 23    End of Session Equipment Utilized During Treatment: Back brace;Gait belt Activity Tolerance: Patient tolerated treatment well Patient left: in chair;with call bell/phone within reach Nurse Communication: Mobility status PT Visit Diagnosis: Other abnormalities of gait and mobility (R26.89);Difficulty in walking, not elsewhere classified (R26.2)      Time: 3875-6433 PT Time Calculation (min) (ACUTE ONLY): 19 min  Charges:  $Gait Training: 8-22 mins                     [place], [place] [phone]    Darin Redmann 02/13/2019, 9:50 AM

## 2019-02-16 ENCOUNTER — Telehealth: Payer: Self-pay | Admitting: Internal Medicine

## 2019-02-16 MED ORDER — OXYCODONE HCL 5 MG PO TABS: 5.0000 mg | ORAL_TABLET | Freq: Four times a day (QID) | ORAL | 0 refills | Status: DC | PRN

## 2019-02-16 MED ORDER — METHOCARBAMOL 500 MG PO TABS: 1000.0000 mg | ORAL_TABLET | Freq: Three times a day (TID) | ORAL | 0 refills | Status: DC

## 2019-02-16 NOTE — Telephone Encounter (Signed)
Sent!

## 2019-02-16 NOTE — Telephone Encounter (Signed)
Refill request for oxycodone and Flexeril. Pt has hosp f/u for 02/20/2019.

## 2019-02-16 NOTE — Telephone Encounter (Signed)
Copied from [place] [id number]. Topic: Quick Communication - Rx Refill/Question >> Feb 16, 2019  3:31 PM Colvin Caroli N wrote: Medication: methocarbamol (ROBAXIN) 500 MG tablet and oxyCODONE (OXY IR/ROXICODONE) 5 MG immediate release tablet   Patient is requesting a refill of these medications. Patient is scheduled for hosp fu on 3/6  Preferred Pharmacy (with phone number or street name):[id number] [address], [place]  [phone] (Phone) [phone] (

## 2019-02-17 ENCOUNTER — Telehealth: Payer: Self-pay | Admitting: *Deleted

## 2019-02-17 NOTE — Telephone Encounter (Signed)
Pt will be due for 2nd Shingrix vaccine on 03/10/19. Sent [employer] and mailed letter to schedule nurse visit for 2nd vaccine.

## 2019-02-20 ENCOUNTER — Ambulatory Visit (INDEPENDENT_AMBULATORY_CARE_PROVIDER_SITE_OTHER): Payer: No Typology Code available for payment source | Admitting: Internal Medicine

## 2019-02-20 ENCOUNTER — Ambulatory Visit (HOSPITAL_BASED_OUTPATIENT_CLINIC_OR_DEPARTMENT_OTHER)
Admission: RE | Admit: 2019-02-20 | Discharge: 2019-02-20 | Disposition: A | Discharge status: Home / Self Care | Payer: 59 | Source: Ambulatory Visit | Attending: Internal Medicine | Admitting: Internal Medicine

## 2019-02-20 ENCOUNTER — Encounter: Payer: Self-pay | Admitting: Internal Medicine

## 2019-02-20 VITALS — BP 132/80 | HR 87 | Temp 98.3°F | Resp 16 | Ht 71.0 in | Wt 212.4 lb

## 2019-02-20 DIAGNOSIS — M7989 Other specified soft tissue disorders: Secondary | ICD-10-CM | POA: Insufficient documentation

## 2019-02-20 DIAGNOSIS — M79662 Pain in left lower leg: Secondary | ICD-10-CM | POA: Diagnosis not present

## 2019-02-20 DIAGNOSIS — X58XXXA Exposure to other specified factors, initial encounter: Secondary | ICD-10-CM

## 2019-02-20 NOTE — Progress Notes (Signed)
Pre visit review using our clinic review tool, if applicable. No additional management support is needed unless otherwise documented below in the visit note. 

## 2019-02-20 NOTE — Patient Instructions (Addendum)
Next visit in 2 months, please make an appointment    STOP BY THE FIRST FLOOR:  get the ultrasound of your left leg.  Decrease Tylenol 500 mg to 2 tablets twice a day  Continue oxycodone 5 mg every 4 hours.  If your pain is not adequately controlled let me know  Continue Robaxin and the medications to prevent constipation.

## 2019-02-20 NOTE — Progress Notes (Signed)
Subjective:    Patient ID: Daniel [name], male    DOB: 09/16/1966, 53 y.o.   MRN: [id number]  DOS:  02/20/2019 Type of visit - description: Hospital follow-up Patient was admitted 02/09/2019, discharged 4 days later. He was admitted after accident at his workplace: Crush spine injury, small right pneumothorax, rib fractures, vertebral fractures. Pneumothorax resolved on its own Neurosurgery evaluated patient, no surgical intervention needed. Was discharged home on brace for comfort Labs reviewed: Labs potassium 3.6, creatinine 0.8, hemoglobin 13.0  Review of Systems Since he left the hospital, he is taking the medication as prescribed. On oxycodone every 4 hours, Tylenol 1000 mg every 6 hours and Robaxin. Pain is relatively well controlled.  Denies fever chills No shortness of breath but he still has difficulty with taking deep breaths. He had a bruise at the proximal left leg, the bruise has extended down.   Past Medical History:  Diagnosis Date  . Asthma   . Depression    h/o  . Diabetes mellitus   . GERD (gastroesophageal reflux disease)   . H/O Clostridium difficile infection 08/2015  . Hyperlipidemia   . Hypertension   . Insomnia   . Polyarthralgia 2009   blood work (-) CKs slightly elevated, bone san (-) saw rheumatology; continue w/ somptoms after holding zocor    Past Surgical History:  Procedure Laterality Date  . HAND SURGERY Left 2006  . HEMORRHOID SURGERY    . HERNIA REPAIR  54/6568   umbilical  . KNEE SURGERY Right 2011   . NASAL FRACTURE SURGERY  2006    Social History   Socioeconomic History  . Marital status: Married    Spouse name: Not on file  . Number of children: 1  . Years of education: Not on file  . Highest education level: Not on file  Occupational History  . Occupation: Glass blower/designer at [employer]  . Occupation: retired Firefighter  Social Needs  . Financial resource strain: Not on file  . Food insecurity:    Worry: Not on  file    Inability: Not on file  . Transportation needs:    Medical: Not on file    Non-medical: Not on file  Tobacco Use  . Smoking status: Passive Smoke Exposure - Never Smoker  . Smokeless tobacco: Never Used  . 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  Substance and Sexual Activity  . Alcohol use: Yes    Alcohol/week: 5.0 standard drinks    Types: 5 Cans of beer per week  . Drug use: No  . Sexual activity: Not on file  Lifestyle  . Physical activity:    Days per week: Not on file    Minutes per session: Not on file  . Stress: Not on file  Relationships  . Social connections:    Talks on phone: Not on file    Gets together: Not on file    Attends religious service: Not on file    Active member of club or organization: Not on file    Attends meetings of clubs or organizations: Not on file    Relationship status: Not on file  . Intimate partner violence:    Fear of current or ex partner: Not on file    Emotionally abused: Not on file    Physically abused: Not on file    Forced sexual activity: Not on file  Other Topics Concern  . Not on file  Social History Narrative  Married, 1 adopted child                Allergies as of 02/20/2019      Reactions   Levofloxacin Other (See Comments)   Tendon rupture at wrist      Medication List       Accurate as of February 20, 2019 11:54 AM. Always use your most recent med list.        acetaminophen 500 MG tablet Commonly known as:  TYLENOL Take 2 tablets (1,000 mg total) by mouth every 6 (six) hours.   aspirin 81 MG tablet Take 81 mg by mouth daily.   carvedilol 12.5 MG tablet Commonly known as:  COREG Take 1 tablet (12.5 mg total) by mouth 2 (two) times daily with a meal.   docusate sodium 100 MG capsule Commonly known as:  COLACE Take 1 capsule (100 mg total) by mouth 2 (two) times daily.   fish oil-omega-3 fatty acids 1000 MG capsule Take 1 g by mouth daily.   FLAX SEED OIL  PO Take 1 each by mouth at bedtime.   losartan 100 MG tablet Commonly known as:  COZAAR Take 1 tablet (100 mg total) by mouth daily.   metFORMIN 850 MG tablet Commonly known as:  Glucophage Take 1 tablet (850 mg total) by mouth 2 (two) times daily with a meal.   methocarbamol 500 MG tablet Commonly known as:  ROBAXIN Take 2 tablets (1,000 mg total) by mouth 3 (three) times daily.   multivitamin,tx-minerals tablet Take 1 tablet by mouth daily.   niacin 500 MG tablet Take 500 mg by mouth at bedtime.   ONE TOUCH ULTRA TEST test strip Generic drug:  glucose blood TEST three times a day   OneTouch Delica Lancets 78I Misc CHECK BLOOD SUGAR NO MORE THAN 3 TIMES A DAY   oxyCODONE 5 MG immediate release tablet Commonly known as:  Oxy IR/ROXICODONE Take 1-2 tablets (5-10 mg total) by mouth every 6 (six) hours as needed.   pantoprazole 40 MG tablet Commonly known as:  PROTONIX Take 1 tablet (40 mg total) by mouth daily before breakfast.   polyethylene glycol packet Commonly known as:  MIRALAX / GLYCOLAX Take 17 g by mouth daily.   PROBIOTIC DAILY PO Take 1 tablet by mouth daily.   Red Yeast Rice 600 MG Caps Take 600 mg by mouth daily.   zolpidem 12.5 MG CR tablet Commonly known as:  AMBIEN CR TAKE 1 TABLET(12.5 MG) BY MOUTH AT BEDTIME AS NEEDED FOR SLEEP           Objective:   Physical Exam BP 132/80 (BP Location: Right Arm, Patient Position: Sitting, Cuff Size: Small)   Pulse 87   Temp 98.3 F (36.8 C) (Oral)   Resp 16   Ht 5\' 11"  (1.803 m)   Wt 212 lb 6 oz (96.3 kg) Comment: w/ brace  SpO2 94%   BMI 29.62 kg/m  General:   Well developed, no acute distress HEENT:  Normocephalic . Face symmetric, atraumatic Lungs:  CTA B Normal respiratory effort, no intercostal retractions, no accessory muscle use. Heart: RRR,  no murmur.  No pretibial edema bilaterally Lower extremities: He has bruises proximally, left calf is about three quarters of a inch larger  in circumference, not particularly tender. Skin: Not pale. Not jaundice Neurologic:  alert & oriented X3.  Speech normal, gait quite limited by pain and the back brace. Psych--  Cognition and judgment appear intact.  Cooperative with normal attention  span and concentration.  Behavior appropriate. He seems appropriately concerned about his injuries.     Assessment      Assessment  DM - dc metformin 08-2015, had diarrhea but likely dt cdiff, ok to restart if needed  HTN Hyperlipidemia Anxiety, insomnia, History of Prozac intolerance "it may me like to hurt people".  Asthma GERD Insomnia Polyarthralgia: w/u and rheumatology eval neg 2009, stopping zocor did not help Diarrhea, + c diff 08-2015, s/p abx   PLAN: Multiple injuries: Recommend the patient to follow-up with the trauma team. I will be in charge from pain management, will continue with oxycodone 5 mg every 4 hours, will decrease Tylenol to 1000 mg twice a day, continue Robaxin. Call for refills as needed He is not developing constipation at this point, continue with Colace and MiraLAX. Needs to continue doing his breathing exercises to prevent atelectasis. I am somewhat concerned about the swelling on the left leg, it could be from the injury but like to be sure he does not have a DVT, will do a stat ultrasound. They are planning outpatient physical therapy. He is aware that this visit is not WC related OV. RTC 2 months.

## 2019-02-22 NOTE — Assessment & Plan Note (Signed)
Multiple injuries: Recommend the patient to follow-up with the trauma team. I will be in charge from pain management, will continue with oxycodone 5 mg every 4 hours, will decrease Tylenol to 1000 mg twice a day, continue Robaxin. Call for refills as needed He is not developing constipation at this point, continue with Colace and MiraLAX. Needs to continue doing his breathing exercises to prevent atelectasis. I am somewhat concerned about the swelling on the left leg, it could be from the injury but like to be sure he does not have a DVT, will do a stat ultrasound. They are planning outpatient physical therapy. He is aware that this visit is not WC related OV. RTC 2 months.

## 2019-02-23 ENCOUNTER — Telehealth: Payer: Self-pay | Admitting: Internal Medicine

## 2019-02-23 MED ORDER — XTAMPZA ER 18 MG PO C12A: 1.0000 | EXTENDED_RELEASE_CAPSULE | Freq: Two times a day (BID) | ORAL | 0 refills | Status: DC

## 2019-02-23 MED ORDER — CELECOXIB 100 MG PO CAPS: 100.0000 mg | ORAL_CAPSULE | Freq: Two times a day (BID) | ORAL | 1 refills | Status: DC

## 2019-02-23 MED ORDER — OXYCODONE HCL 5 MG PO TABS: 5.0000 mg | ORAL_TABLET | Freq: Four times a day (QID) | ORAL | 0 refills | Status: DC | PRN

## 2019-02-23 MED ORDER — OXYCODONE HCL ER 20 MG PO T12A: 20.0000 mg | EXTENDED_RELEASE_TABLET | Freq: Two times a day (BID) | ORAL | 0 refills | Status: DC

## 2019-02-23 NOTE — Telephone Encounter (Signed)
Currently on oxycodone 5 mg taking 2 tablets every 4 hours, 60 mg daily. Plan: OxyContin ER 20 mg twice a day #60 no refill Celebrex 100 mg twice a day with food.  Watch for GI side effects Decrease oxycodone 5 mg to every 6 hours for breakthrough pain The patient already has Narcan at home, discussed how to use it. Advised patient to call me in 2 weeks and let me know how this new regimen is working.  All instructions were discussed with the patient over the phone, the patient's brother was also there, both verbalized understanding.

## 2019-02-23 NOTE — Telephone Encounter (Signed)
Crush injury recently. Please advise.

## 2019-02-23 NOTE — Telephone Encounter (Signed)
Spoke w/ Pt- informed of brand name change from Oxycontin to [place]. Pt verbalized understanding.

## 2019-02-23 NOTE — Addendum Note (Signed)
Addended by: Kathlene November E on: 02/23/2019 04:59 PM   Modules accepted: Orders

## 2019-02-23 NOTE — Telephone Encounter (Signed)
Copied from [place] [id number]. Topic: Quick Communication - Rx Refill/Question >> Feb 23, 2019  1:00 PM Blase Mess A wrote: Medication: oxyCODONE (OXY IR/ROXICODONE) 5 MG immediate release tablet [[id number]]   Has the patient contacted their pharmacy? {Yes  (Agent: If no, request that the patient contact the pharmacy for the refill.) (Agent: If yes, when and what did the pharmacy advise?)  Preferred Pharmacy (with phone number or street name): [id number] [address], [place] [phone] (Phone) [phone] (Fax)    Agent: Please be advised that RX refills may take up to 3 business days. We ask that you follow-up with your pharmacy.

## 2019-02-23 NOTE — Telephone Encounter (Signed)
Addendum, per insurance constraints we will switch OxyContin ER to [place].  Please let the patient know.

## 2019-02-25 ENCOUNTER — Ambulatory Visit: Payer: Self-pay | Admitting: *Deleted

## 2019-02-25 NOTE — Telephone Encounter (Signed)
Glean Hess w/[employer] [employer] called and says she's with workman's comp and has questions for Dr. Larose Kells about the following medications OxyIR 5 mg, Oxycodone 20 mg 12hr and Xtampza ER 18 mg:  1. Are these prescriptions accurate as written? 2. Will he be the one weaning the patient off of the medications?  I advised I will send to Dr. Larose Kells and someone from the office will call back with his recommendation.  Reason for Disposition . Caller has NON-URGENT medication question about med that PCP prescribed and triager unable to answer question  Protocols used: MEDICATION QUESTION CALL-A-AH

## 2019-02-25 NOTE — Telephone Encounter (Signed)
LMOM for Daniel [name], instructed to call back.

## 2019-02-25 NOTE — Telephone Encounter (Addendum)
The patient has been actually prescribed only Xtampza am the rapid release Oxycodone. The prescription for oxycodone 20 mg 12 hours was canceled. The patient will be weaned off medications on due time. Office visit is next plan at office visit is in 2 months, please call patient and actually get him back in 1 month for pain management.

## 2019-02-25 NOTE — Telephone Encounter (Signed)
Pt has appt scheduled on 04/22/2019. Will try to reach out to case manager to see if she will accept my call.

## 2019-03-02 ENCOUNTER — Telehealth: Payer: Self-pay | Admitting: Internal Medicine

## 2019-03-02 ENCOUNTER — Telehealth: Payer: Self-pay | Admitting: *Deleted

## 2019-03-02 NOTE — Telephone Encounter (Signed)
I called  the patient today, good compliance with extended release OxyContin twice a day and Celebrex twice a day. He tried to "skip" the extended release oxycodone once but that did not work. Basically not needing OxyContin 5 mg as needed. Plan: --Reassess situation weekly, reduce oxycodone extended release when  possible.  -- Continue same regimen for now, he is hopeful he will start physical therapy soon consequently his pain may be exacerbated.

## 2019-03-02 NOTE — Telephone Encounter (Signed)
Received request for Medical Records from [facility]; forwarded to Medical Records via email/scan/SLS

## 2019-03-04 ENCOUNTER — Telehealth: Payer: Self-pay | Admitting: Internal Medicine

## 2019-03-04 NOTE — Telephone Encounter (Signed)
Refill requests for Ambien CR.   Last OV: 02/20/2019 Last Fill:  01/05/2019 #30 and 1RF UDS: None

## 2019-03-04 NOTE — Telephone Encounter (Signed)
Sent!

## 2019-03-13 ENCOUNTER — Ambulatory Visit (INDEPENDENT_AMBULATORY_CARE_PROVIDER_SITE_OTHER): Payer: 59 | Admitting: Internal Medicine

## 2019-03-13 ENCOUNTER — Other Ambulatory Visit: Payer: Self-pay

## 2019-03-13 DIAGNOSIS — M79605 Pain in left leg: Secondary | ICD-10-CM | POA: Diagnosis not present

## 2019-03-13 DIAGNOSIS — X58XXXA Exposure to other specified factors, initial encounter: Secondary | ICD-10-CM | POA: Diagnosis not present

## 2019-03-13 NOTE — Progress Notes (Signed)
Subjective:    Patient ID: Daniel [name], male    DOB: Aug 26, 1966, 53 y.o.   MRN: [id number]  DOS:  03/13/2019 Type of visit - description:  Virtual Visit via Video Note  I connected with [name] on 03/13/19 at  2:00 PM EDT by a video enabled telemedicine application and verified that I am speaking with the correct person using two identifiers.   THIS ENCOUNTER IS A VIRTUAL VISIT DUE TO COVID-19 - PATIENT WAS NOT SEEN IN THE OFFICE. PATIENT HAS CONSENTED TO VIRTUAL VISIT / TELEMEDICINE VISIT   Location of patient: home  Location of provider: office  I discussed the limitations of evaluation and management by telemedicine and the availability of in person appointments. The patient expressed understanding and agreed to proceed.   The patient reports that he is doing well. He is taking  extended release oxycodone only once a day, Celebrex twice a day, quick release oxycodone 3-4 times a week. Back pain has improved. He still has ache at the left leg at the thigh, near the knee and calf. This has not been assessed at the trauma clinic follow-up according to the patient. Denies any swelling  Review of Systems Denies nausea, vomiting, diarrhea. Sleeping okay No constipation  Past Medical History:  Diagnosis Date  . Asthma   . Depression    h/o  . Diabetes mellitus   . GERD (gastroesophageal reflux disease)   . H/O Clostridium difficile infection 08/2015  . Hyperlipidemia   . Hypertension   . Insomnia   . Polyarthralgia 2009   blood work (-) CKs slightly elevated, bone san (-) saw rheumatology; continue w/ somptoms after holding zocor    Past Surgical History:  Procedure Laterality Date  . HAND SURGERY Left 2006  . HEMORRHOID SURGERY    . HERNIA REPAIR  40/8144   umbilical  . KNEE SURGERY Right 2011   . NASAL FRACTURE SURGERY  2006    Social History   Socioeconomic History  . Marital status: Married    Spouse name: Not on file  . Number of children: 1  .  Years of education: Not on file  . Highest education level: Not on file  Occupational History  . Occupation: Glass blower/designer at [employer]  . Occupation: retired Firefighter  Social Needs  . Financial resource strain: Not on file  . Food insecurity:    Worry: Not on file    Inability: Not on file  . Transportation needs:    Medical: Not on file    Non-medical: Not on file  Tobacco Use  . Smoking status: Passive Smoke Exposure - Never Smoker  . Smokeless tobacco: Never Used  . 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  Substance and Sexual Activity  . Alcohol use: Yes    Alcohol/week: 5.0 standard drinks    Types: 5 Cans of beer per week  . Drug use: No  . Sexual activity: Not on file  Lifestyle  . Physical activity:    Days per week: Not on file    Minutes per session: Not on file  . Stress: Not on file  Relationships  . Social connections:    Talks on phone: Not on file    Gets together: Not on file    Attends religious service: Not on file    Active member of club or organization: Not on file    Attends meetings of clubs or organizations: Not on file  Relationship status: Not on file  . Intimate partner violence:    Fear of current or ex partner: Not on file    Emotionally abused: Not on file    Physically abused: Not on file    Forced sexual activity: Not on file  Other Topics Concern  . Not on file  Social History Narrative   Married, 1 adopted child                Allergies as of 03/13/2019      Reactions   Levofloxacin Other (See Comments)   Tendon rupture at wrist      Medication List       Accurate as of March 13, 2019  2:05 PM. Always use your most recent med list.        acetaminophen 500 MG tablet Commonly known as:  TYLENOL Take 2 tablets (1,000 mg total) by mouth every 6 (six) hours.   aspirin 81 MG tablet Take 81 mg by mouth daily.   carvedilol 12.5 MG tablet Commonly known as:  COREG Take 1  tablet (12.5 mg total) by mouth 2 (two) times daily with a meal.   celecoxib 100 MG capsule Commonly known as:  CeleBREX Take 1 capsule (100 mg total) by mouth 2 (two) times daily.   docusate sodium 100 MG capsule Commonly known as:  COLACE Take 1 capsule (100 mg total) by mouth 2 (two) times daily.   fish oil-omega-3 fatty acids 1000 MG capsule Take 1 g by mouth daily.   FLAX SEED OIL PO Take 1 each by mouth at bedtime.   losartan 100 MG tablet Commonly known as:  COZAAR Take 1 tablet (100 mg total) by mouth daily.   metFORMIN 850 MG tablet Commonly known as:  Glucophage Take 1 tablet (850 mg total) by mouth 2 (two) times daily with a meal.   methocarbamol 500 MG tablet Commonly known as:  ROBAXIN Take 2 tablets (1,000 mg total) by mouth 3 (three) times daily.   multivitamin,tx-minerals tablet Take 1 tablet by mouth daily.   niacin 500 MG tablet Take 500 mg by mouth at bedtime.   ONE TOUCH ULTRA TEST test strip Generic drug:  glucose blood TEST three times a day   OneTouch Delica Lancets 16X Misc CHECK BLOOD SUGAR NO MORE THAN 3 TIMES A DAY   oxyCODONE 5 MG immediate release tablet Commonly known as:  Oxy IR/ROXICODONE Take 1 tablet (5 mg total) by mouth every 6 (six) hours as needed.   oxyCODONE 20 mg 12 hr tablet Commonly known as:  OXYCONTIN Take 1 tablet (20 mg total) by mouth every 12 (twelve) hours for 30 days.   pantoprazole 40 MG tablet Commonly known as:  PROTONIX Take 1 tablet (40 mg total) by mouth daily before breakfast.   polyethylene glycol packet Commonly known as:  MIRALAX / GLYCOLAX Take 17 g by mouth daily.   PROBIOTIC DAILY PO Take 1 tablet by mouth daily.   Red Yeast Rice 600 MG Caps Take 600 mg by mouth daily.   Xtampza ER 18 MG C12a Generic drug:  oxyCODONE ER Take 1 tablet by mouth 2 (two) times daily for 30 days.   zolpidem 12.5 MG CR tablet Commonly known as:  AMBIEN CR TAKE 1 TABLET(12.5 MG) BY MOUTH AT BEDTIME AS NEEDED  FOR SLEEP           Objective:   Physical Exam There were no vitals taken for this visit. 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  Asthma GERD Insomnia Polyarthralgia: w/u and rheumatology eval neg 2009, stopping zocor did not help Diarrhea, + c diff 08-2015, s/p abx   PLAN: Multiple injuries: This video conference was mostly to evaluate pain management. The patient currently is taking less pain medication: Oxycodone ER 20 mg only once a day instead of twice a day Takes oxycodone quick release 3-4 times a week Taking Celebrex twice daily without apparent side effects. Plan: Continue with single dose of oxycodone ER 20 mg at night Decrease Celebrex 100 mg 1 tablet daily Continue with occasional use of oxycodone rapid release Left leg pain: According to the patient, this has not being evaluated by the trauma team follow-up.  Refer to orthopedic surgery. Next visit or video conference: 2 weeks   I discussed the assessment and treatment plan with the patient. The patient was provided an opportunity to ask questions and all were answered. The patient agreed with the plan and demonstrated an understanding of the instructions.   The patient was advised to call back or seek an in-person evaluation if the symptoms worsen or if the condition fails to improve as anticipated.  I provided 19 minutes of non-face-to-face time during this encounter.   Kathlene November, MD

## 2019-03-16 NOTE — Assessment & Plan Note (Signed)
Multiple injuries: This video conference was mostly to evaluate pain management. The patient currently is taking less pain medication: Oxycodone ER 20 mg only once a day instead of twice a day Takes oxycodone quick release 3-4 times a week Taking Celebrex twice daily without apparent side effects. Plan: Continue with single dose of oxycodone ER 20 mg at night Decrease Celebrex 100 mg 1 tablet daily Continue with occasional use of oxycodone rapid release Left leg pain: According to the patient, this has not being evaluated by the trauma team follow-up.  Refer to orthopedic surgery. Next visit or video conference: 2 weeks

## 2019-03-31 ENCOUNTER — Telehealth: Payer: Self-pay

## 2019-03-31 NOTE — Telephone Encounter (Signed)
LMOM informing Pt to return call to schedule 2 week virtual visit f/u.

## 2019-04-01 NOTE — Telephone Encounter (Signed)
Virtual Visit scheduled 04/02/2019.

## 2019-04-02 ENCOUNTER — Encounter: Payer: Self-pay | Admitting: Internal Medicine

## 2019-04-02 ENCOUNTER — Ambulatory Visit (INDEPENDENT_AMBULATORY_CARE_PROVIDER_SITE_OTHER): Payer: 59 | Admitting: Internal Medicine

## 2019-04-02 ENCOUNTER — Other Ambulatory Visit: Payer: Self-pay

## 2019-04-02 DIAGNOSIS — G4700 Insomnia, unspecified: Secondary | ICD-10-CM | POA: Diagnosis not present

## 2019-04-02 DIAGNOSIS — X58XXXA Exposure to other specified factors, initial encounter: Secondary | ICD-10-CM

## 2019-04-02 DIAGNOSIS — M79605 Pain in left leg: Secondary | ICD-10-CM

## 2019-04-02 MED ORDER — OXYCODONE HCL ER 10 MG PO T12A: 10.0000 mg | EXTENDED_RELEASE_TABLET | Freq: Two times a day (BID) | ORAL | 0 refills | Status: DC | PRN

## 2019-04-02 NOTE — Progress Notes (Signed)
Subjective:    Patient ID: Daniel [name], male    DOB: Aug 12, 1966, 53 y.o.   MRN: [id number]  DOS:  04/02/2019 Type of visit - description: Virtual Visit via Video Note  I connected with@ on 04/02/19 at 10:40 AM EDT by a video enabled telemedicine application and verified that I am speaking with the correct person using two identifiers.   THIS ENCOUNTER IS A VIRTUAL VISIT DUE TO COVID-19 - PATIENT WAS NOT SEEN IN THE OFFICE. PATIENT HAS CONSENTED TO VIRTUAL VISIT / TELEMEDICINE VISIT   Location of patient: home  Location of provider: office  I discussed the limitations of evaluation and management by telemedicine and the availability of in person appointments. The patient expressed understanding and agreed to proceed.  History of Present Illness: Follow-up The main purpose of this visit is pain management. He is doing very well. Taking pain medication essentially as needed. Not sleeping well in the last few nights, on Ambien.  We discussed changing his medication. Coronavirus: He has a N 95 mask, use that consistently. Was unable to see the orthopedic doctor for leg pain.   Review of Systems No fever chills No nausea, vomiting, abdominal pain.  No constipation. Some heartburn. Denies any abdominal pain or change in the color of the stools  Past Medical History:  Diagnosis Date  . Asthma   . Depression    h/o  . Diabetes mellitus   . GERD (gastroesophageal reflux disease)   . H/O Clostridium difficile infection 08/2015  . Hyperlipidemia   . Hypertension   . Insomnia   . Polyarthralgia 2009   blood work (-) CKs slightly elevated, bone san (-) saw rheumatology; continue w/ somptoms after holding zocor    Past Surgical History:  Procedure Laterality Date  . HAND SURGERY Left 2006  . HEMORRHOID SURGERY    . HERNIA REPAIR  66/2947   umbilical  . KNEE SURGERY Right 2011   . NASAL FRACTURE SURGERY  2006    Social History   Socioeconomic History  . Marital  status: Married    Spouse name: Not on file  . Number of children: 1  . Years of education: Not on file  . Highest education level: Not on file  Occupational History  . Occupation: Glass blower/designer at [employer]  . Occupation: retired Firefighter  Social Needs  . Financial resource strain: Not on file  . Food insecurity:    Worry: Not on file    Inability: Not on file  . Transportation needs:    Medical: Not on file    Non-medical: Not on file  Tobacco Use  . Smoking status: Passive Smoke Exposure - Never Smoker  . Smokeless tobacco: Never Used  . 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  Substance and Sexual Activity  . Alcohol use: Yes    Alcohol/week: 5.0 standard drinks    Types: 5 Cans of beer per week  . Drug use: No  . Sexual activity: Not on file  Lifestyle  . Physical activity:    Days per week: Not on file    Minutes per session: Not on file  . Stress: Not on file  Relationships  . Social connections:    Talks on phone: Not on file    Gets together: Not on file    Attends religious service: Not on file    Active member of club or organization: Not on file    Attends meetings of clubs or  organizations: Not on file    Relationship status: Not on file  . Intimate partner violence:    Fear of current or ex partner: Not on file    Emotionally abused: Not on file    Physically abused: Not on file    Forced sexual activity: Not on file  Other Topics Concern  . Not on file  Social History Narrative   Married, 1 adopted child                Allergies as of 04/02/2019      Reactions   Levofloxacin Other (See Comments)   Tendon rupture at wrist      Medication List       Accurate as of April 02, 2019 10:46 AM. Always use your most recent med list.        acetaminophen 500 MG tablet Commonly known as:  TYLENOL Take 2 tablets (1,000 mg total) by mouth every 6 (six) hours.   aspirin 81 MG tablet Take 81 mg by mouth  daily.   carvedilol 12.5 MG tablet Commonly known as:  COREG Take 1 tablet (12.5 mg total) by mouth 2 (two) times daily with a meal.   CeleBREX 100 MG capsule Generic drug:  celecoxib Take 1 capsule (100 mg total) by mouth daily.   docusate sodium 100 MG capsule Commonly known as:  COLACE Take 1 capsule (100 mg total) by mouth 2 (two) times daily.   fish oil-omega-3 fatty acids 1000 MG capsule Take 1 g by mouth daily.   FLAX SEED OIL PO Take 1 each by mouth at bedtime.   losartan 100 MG tablet Commonly known as:  COZAAR Take 1 tablet (100 mg total) by mouth daily.   metFORMIN 850 MG tablet Commonly known as:  Glucophage Take 1 tablet (850 mg total) by mouth 2 (two) times daily with a meal.   methocarbamol 500 MG tablet Commonly known as:  ROBAXIN Take 2 tablets (1,000 mg total) by mouth 3 (three) times daily.   multivitamin,tx-minerals tablet Take 1 tablet by mouth daily.   niacin 500 MG tablet Take 500 mg by mouth at bedtime.   ONE TOUCH ULTRA TEST test strip Generic drug:  glucose blood TEST three times a day   OneTouch Delica Lancets 64P Misc CHECK BLOOD SUGAR NO MORE THAN 3 TIMES A DAY   oxyCODONE 5 MG immediate release tablet Commonly known as:  Oxy IR/ROXICODONE Take 1 tablet (5 mg total) by mouth every 6 (six) hours as needed.   oxyCODONE 20 mg 12 hr tablet Commonly known as:  OXYCONTIN Take 1 tablet (20 mg total) by mouth daily.   pantoprazole 40 MG tablet Commonly known as:  PROTONIX Take 1 tablet (40 mg total) by mouth daily before breakfast.   polyethylene glycol 17 g packet Commonly known as:  MIRALAX / GLYCOLAX Take 17 g by mouth daily.   PROBIOTIC DAILY PO Take 1 tablet by mouth daily.   Red Yeast Rice 600 MG Caps Take 600 mg by mouth daily.   zolpidem 12.5 MG CR tablet Commonly known as:  AMBIEN CR TAKE 1 TABLET(12.5 MG) BY MOUTH AT BEDTIME AS NEEDED FOR SLEEP           Objective:   Physical Exam There were no vitals taken  for this visit. This is video virtual visit, alert oriented x3, in no apparent distress    Assessment     Assessment  DM - dc metformin 08-2015, had diarrhea but likely  dt cdiff, ok to restart if needed  HTN Hyperlipidemia Anxiety, insomnia, History of Prozac intolerance "it may me like to hurt people".  Asthma GERD Insomnia Polyarthralgia: w/u and rheumatology eval neg 2009, stopping zocor did not help Diarrhea, + c diff 08-2015, s/p abx   PLAN: DM: Good compliance with metformin, ambulatory CBGs range from 99 to the 120s.  Due for A1c at the next opportunity Multiple injuries: Currently on oxycodone ER 20 mg, takes on average 3-4 a week. On Celebrex daily Not taking Robaxin, rather taking Flexeril at bedtime sometimes. Plan: Decrease oxycodone ER to 10 mg twice daily as needed, Rx sent. Continue with Celebrex with food, RF Flexeril when needed. Anxiety, insomnia: Not sleeping well in the last few nights, we talk about possibly adjust Ambien, he declined it.  Will reassess on RTC Leg pain: Worker's Comp. declined the Ortho referral, plans to discuss that with his back surgeon GERD: Has occasional heartburn, encouraged to continue Protonix before breakfast, related to Celebrex?  Will reassess on RTC Next visit scheduled for Apr 22, 2019, face-to-face if possible otherwise will do a virtual visit.    I discussed the assessment and treatment plan with the patient. The patient was provided an opportunity to ask questions and all were answered. The patient agreed with the plan and demonstrated an understanding of the instructions.   The patient was advised to call back or seek an in-person evaluation if the symptoms worsen or if the condition fails to improve as anticipated.

## 2019-04-03 NOTE — Assessment & Plan Note (Signed)
DM: Good compliance with metformin, ambulatory CBGs range from 99 to the 120s.  Due for A1c at the next opportunity Multiple injuries: Currently on oxycodone ER 20 mg, takes on average 3-4 a week. On Celebrex daily Not taking Robaxin, rather taking Flexeril at bedtime sometimes. Plan: Decrease oxycodone ER to 10 mg twice daily as needed, Rx sent. Continue with Celebrex with food, RF Flexeril when needed. Anxiety, insomnia: Not sleeping well in the last few nights, we talk about possibly adjust Ambien, he declined it.  Will reassess on RTC Leg pain: Worker's Comp. declined the Ortho referral, plans to discuss that with his back surgeon GERD: Has occasional heartburn, encouraged to continue Protonix before breakfast, related to Celebrex?  Will reassess on RTC Next visit scheduled for Apr 22, 2019, face-to-face if possible otherwise will do a virtual visit.

## 2019-04-22 ENCOUNTER — Other Ambulatory Visit: Payer: Self-pay

## 2019-04-22 ENCOUNTER — Ambulatory Visit: Payer: 59 | Admitting: Internal Medicine

## 2019-04-22 ENCOUNTER — Encounter: Payer: Self-pay | Admitting: Internal Medicine

## 2019-04-22 VITALS — BP 138/94 | HR 78 | Temp 98.5°F | Resp 16 | Ht 71.0 in | Wt 213.0 lb

## 2019-04-22 DIAGNOSIS — G4700 Insomnia, unspecified: Secondary | ICD-10-CM

## 2019-04-22 DIAGNOSIS — I10 Essential (primary) hypertension: Secondary | ICD-10-CM | POA: Diagnosis not present

## 2019-04-22 DIAGNOSIS — F419 Anxiety disorder, unspecified: Secondary | ICD-10-CM

## 2019-04-22 DIAGNOSIS — E118 Type 2 diabetes mellitus with unspecified complications: Secondary | ICD-10-CM | POA: Diagnosis not present

## 2019-04-22 DIAGNOSIS — Z79899 Other long term (current) drug therapy: Secondary | ICD-10-CM | POA: Diagnosis not present

## 2019-04-22 DIAGNOSIS — X58XXXA Exposure to other specified factors, initial encounter: Secondary | ICD-10-CM | POA: Diagnosis not present

## 2019-04-22 LAB — HEMOGLOBIN A1C: Hgb A1c MFr Bld: 5.1 % (ref 4.6–6.5)

## 2019-04-22 LAB — CBC WITH DIFFERENTIAL/PLATELET
Basophils Absolute: 0 10*3/uL (ref 0.0–0.1)
Basophils Relative: 0.6 % (ref 0.0–3.0)
Eosinophils Absolute: 0.5 10*3/uL (ref 0.0–0.7)
Eosinophils Relative: 7.1 % — ABNORMAL HIGH (ref 0.0–5.0)
HCT: 43.2 % (ref 39.0–52.0)
Hemoglobin: 15.5 g/dL (ref 13.0–17.0)
Lymphocytes Relative: 34 % (ref 12.0–46.0)
Lymphs Abs: 2.3 10*3/uL (ref 0.7–4.0)
MCHC: 35.9 g/dL (ref 30.0–36.0)
MCV: 86.6 fl (ref 78.0–100.0)
Monocytes Absolute: 0.6 10*3/uL (ref 0.1–1.0)
Monocytes Relative: 8.5 % (ref 3.0–12.0)
Neutro Abs: 3.4 10*3/uL (ref 1.4–7.7)
Neutrophils Relative %: 49.8 % (ref 43.0–77.0)
Platelets: 131 10*3/uL — ABNORMAL LOW (ref 150.0–400.0)
RBC: 4.99 Mil/uL (ref 4.22–5.81)
RDW: 14 % (ref 11.5–15.5)
WBC: 6.9 10*3/uL (ref 4.0–10.5)

## 2019-04-22 NOTE — Progress Notes (Signed)
Subjective:    Patient ID: Daniel [name], male    DOB: [birth year], 53 y.o.   MRN: [id number]  DOS:  04/22/2019 Type of visit - description: Follow-up, in person Pain management: Medications reviewed, good compliance, pain is relatively well controlled DM: Ambulatory CBGs range from 90-1 50.  Room for improving lifestyle Anxiety, insomnia: Currently well controlled HTN: Ambulatory BPs around 138/80s, occasionally diastolic BP in the 52D.  Review of Systems  Denies fever chills No chest pain no difficulty breathing No lower extremity edema Ongoing inner side left leg pain and  left arm numbness since the accident.   Past Medical History:  Diagnosis Date  . Asthma   . Depression    h/o  . Diabetes mellitus   . GERD (gastroesophageal reflux disease)   . H/O Clostridium difficile infection 08/2015  . Hyperlipidemia   . Hypertension   . Insomnia   . Polyarthralgia 2009   blood work (-) CKs slightly elevated, bone san (-) saw rheumatology; continue w/ somptoms after holding zocor    Past Surgical History:  Procedure Laterality Date  . HAND SURGERY Left 2006  . HEMORRHOID SURGERY    . HERNIA REPAIR  78/2423   umbilical  . KNEE SURGERY Right 2011   . NASAL FRACTURE SURGERY  2006    Social History   Socioeconomic History  . Marital status: Married    Spouse name: Not on file  . Number of children: 1  . Years of education: Not on file  . Highest education level: Not on file  Occupational History  . Occupation: Glass blower/designer at [employer]  . Occupation: retired Firefighter  Social Needs  . Financial resource strain: Not on file  . Food insecurity:    Worry: Not on file    Inability: Not on file  . Transportation needs:    Medical: Not on file    Non-medical: Not on file  Tobacco Use  . Smoking status: Passive Smoke Exposure - Never Smoker  . Smokeless tobacco: Never Used  . 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   Substance and Sexual Activity  . Alcohol use: Yes    Alcohol/week: 5.0 standard drinks    Types: 5 Cans of beer per week  . Drug use: No  . Sexual activity: Not on file  Lifestyle  . Physical activity:    Days per week: Not on file    Minutes per session: Not on file  . Stress: Not on file  Relationships  . Social connections:    Talks on phone: Not on file    Gets together: Not on file    Attends religious service: Not on file    Active member of club or organization: Not on file    Attends meetings of clubs or organizations: Not on file    Relationship status: Not on file  . Intimate partner violence:    Fear of current or ex partner: Not on file    Emotionally abused: Not on file    Physically abused: Not on file    Forced sexual activity: Not on file  Other Topics Concern  . Not on file  Social History Narrative   Married, 1 adopted child                Allergies as of 04/22/2019      Reactions   Levofloxacin Other (See Comments)   Tendon rupture at wrist      Medication  List       Accurate as of Apr 22, 2019 11:59 PM. If you have any questions, ask your nurse or doctor.        acetaminophen 500 MG tablet Commonly known as:  TYLENOL Take 2 tablets (1,000 mg total) by mouth every 6 (six) hours.   aspirin 81 MG tablet Take 81 mg by mouth daily.   carvedilol 12.5 MG tablet Commonly known as:  COREG Take 1 tablet (12.5 mg total) by mouth 2 (two) times daily with a meal.   CeleBREX 100 MG capsule Generic drug:  celecoxib Take 1 capsule (100 mg total) by mouth daily.   cyclobenzaprine 10 MG tablet Commonly known as:  FLEXERIL Take 10 mg by mouth daily as needed for muscle spasms.   docusate sodium 100 MG capsule Commonly known as:  COLACE Take 1 capsule (100 mg total) by mouth 2 (two) times daily.   fish oil-omega-3 fatty acids 1000 MG capsule Take 1 g by mouth daily.   FLAX SEED OIL PO Take 1 each by mouth at bedtime.   losartan 100 MG tablet  Commonly known as:  COZAAR Take 1 tablet (100 mg total) by mouth daily.   metFORMIN 850 MG tablet Commonly known as:  Glucophage Take 1 tablet (850 mg total) by mouth 2 (two) times daily with a meal.   multivitamin,tx-minerals tablet Take 1 tablet by mouth daily.   niacin 500 MG tablet Take 500 mg by mouth at bedtime.   ONE TOUCH ULTRA TEST test strip Generic drug:  glucose blood TEST three times a day   OneTouch Delica Lancets 69G Misc CHECK BLOOD SUGAR NO MORE THAN 3 TIMES A DAY   oxyCODONE 5 MG immediate release tablet Commonly known as:  Oxy IR/ROXICODONE Take 1 tablet (5 mg total) by mouth every 6 (six) hours as needed.   oxyCODONE 10 mg 12 hr tablet Commonly known as:  OXYCONTIN Take 1 tablet (10 mg total) by mouth every 12 (twelve) hours as needed.   pantoprazole 40 MG tablet Commonly known as:  PROTONIX Take 1 tablet (40 mg total) by mouth daily before breakfast.   polyethylene glycol 17 g packet Commonly known as:  MIRALAX / GLYCOLAX Take 17 g by mouth daily.   PROBIOTIC DAILY PO Take 1 tablet by mouth daily.   Red Yeast Rice 600 MG Caps Take 600 mg by mouth daily.   zolpidem 12.5 MG CR tablet Commonly known as:  AMBIEN CR TAKE 1 TABLET(12.5 MG) BY MOUTH AT BEDTIME AS NEEDED FOR SLEEP           Objective:   Physical Exam BP (!) 138/94 (BP Location: Left Arm, Patient Position: Sitting, Cuff Size: Normal)   Pulse 78   Temp 98.5 F (36.9 C) (Oral)   Resp 16   Ht 5\' 11"  (1.803 m)   Wt 213 lb (96.6 kg) Comment: with brace  SpO2 99%   BMI 29.71 kg/m  General:   Well developed, NAD, BMI noted.  Using a chest vest HEENT:  Normocephalic . Face symmetric, atraumatic Lungs:  CTA B Normal respiratory effort, no intercostal retractions, no accessory muscle use. Heart: RRR,  no murmur.  No pretibial edema bilaterally  Skin: Not pale. Not jaundice Neurologic:  alert & oriented X3.  Speech normal, gait appropriate for age and unassisted Psych--   Cognition and judgment appear intact.  Cooperative with normal attention span and concentration.  Behavior appropriate. No anxious or depressed appearing.  Assessment     Assessment  DM - dc metformin 08-2015, had diarrhea but likely dt cdiff, ok to restart if needed  HTN Hyperlipidemia Anxiety, insomnia, History of Prozac intolerance "it may me like to hurt people".  Asthma GERD Insomnia Polyarthralgia: w/u and rheumatology eval neg 2009, stopping zocor did not help Diarrhea, + c diff 08-2015, s/p abx   PLAN: DM: Currently on metformin, A1c was 7.0 few months ago, diet and exercise not the best currently.  We will check a A1c.  Encouraged to eat as healthy as possible. HTN: Diastolic BP occasionally elevated, for now we will continue with carvedilol, losartan.  Check a CMP and CBC Multiple injuries: Current management is oxycodone ER 10 mg at bedtime 3-4 times a week. Celebrex daily, Flexeril as needed. Has some leftover oxycodone quick release, will keep in the medication list for now. Continue present care, get UDS.  Reassess in 3 months Still sees orthopedic for his multiple injuries, has residual left inner leg pain and left arm numbness, MRI is pending according to the patient Anxiety, insomnia: Doing okay with Ambien CR. RTC 3 months.

## 2019-04-22 NOTE — Patient Instructions (Signed)
GO TO THE LAB : Get the blood work     GO TO THE FRONT DESK Schedule your next appointment   For a check up in 3 month   Check the  blood pressure 2 or 3 times a   Week   Be sure your blood pressure is between 110/65 and  135/85. If it is consistently higher or lower, let me know   HOW TO TAKE YOUR BLOOD PRESSURE:   Rest 5 minutes before taking your blood pressure.   Don't smoke or drink caffeinated beverages for at least 30 minutes before.   Take your blood pressure before (not after) you eat.   Sit comfortably with your back supported and both feet on the floor (don't cross your legs).   Elevate your arm to heart level on a table or a desk.   Use the proper sized cuff. It should fit smoothly and snugly around your bare upper arm. There should be enough room to slip a fingertip under the cuff. The bottom edge of the cuff should be 1 inch above the crease of the elbow.   Ideally, take 3 measurements at one sitting and record the average.

## 2019-04-22 NOTE — Progress Notes (Signed)
Pre visit review using our clinic review tool, if applicable. No additional management support is needed unless otherwise documented below in the visit note. 

## 2019-04-23 LAB — COMPREHENSIVE METABOLIC PANEL
ALT: 26 U/L (ref 0–53)
AST: 18 U/L (ref 0–37)
Albumin: 4.4 g/dL (ref 3.5–5.2)
Alkaline Phosphatase: 96 U/L (ref 39–117)
BUN: 13 mg/dL (ref 6–23)
CO2: 27 mEq/L (ref 19–32)
Calcium: 9.1 mg/dL (ref 8.4–10.5)
Chloride: 102 mEq/L (ref 96–112)
Creatinine, Ser: 1.04 mg/dL (ref 0.40–1.50)
GFR: 89.74 mL/min (ref >60.00)
Glucose, Bld: 116 mg/dL — ABNORMAL HIGH (ref 70–99)
Potassium: 3.9 mEq/L (ref 3.5–5.1)
Sodium: 139 mEq/L (ref 135–145)
Total Bilirubin: 0.8 mg/dL (ref 0.2–1.2)
Total Protein: 6.7 g/dL (ref 6.0–8.3)

## 2019-04-24 LAB — PAIN MGMT, PROFILE 8 W/CONF, U
6 Acetylmorphine: NEGATIVE ng/mL
Alcohol Metabolites: NEGATIVE ng/mL (ref <500)
Amphetamines: NEGATIVE ng/mL
Benzodiazepines: NEGATIVE ng/mL
Buprenorphine, Urine: NEGATIVE ng/mL
Cocaine Metabolite: NEGATIVE ng/mL
Codeine: NEGATIVE ng/mL
Creatinine: 282.4 mg/dL
Hydrocodone: 876 ng/mL
Hydromorphone: 376 ng/mL
MDMA: NEGATIVE ng/mL
Marijuana Metabolite: NEGATIVE ng/mL
Morphine: NEGATIVE ng/mL
Norhydrocodone: 1670 ng/mL
Noroxycodone: 1902 ng/mL
Opiates: POSITIVE ng/mL
Oxidant: NEGATIVE ug/mL
Oxycodone: 901 ng/mL
Oxycodone: POSITIVE ng/mL
Oxymorphone: 1082 ng/mL
pH: 5.6 (ref 4.5–9.0)

## 2019-04-24 NOTE — Assessment & Plan Note (Signed)
DM: Currently on metformin, A1c was 7.0 few months ago, diet and exercise not the best currently.  We will check a A1c.  Encouraged to eat as healthy as possible. HTN: Diastolic BP occasionally elevated, for now we will continue with carvedilol, losartan.  Check a CMP and CBC Multiple injuries: Current management is oxycodone ER 10 mg at bedtime 3-4 times a week. Celebrex daily, Flexeril as needed. Has some leftover oxycodone quick release, will keep in the medication list for now. Continue present care, get UDS.  Reassess in 3 months Still sees orthopedic for his multiple injuries, has residual left inner leg pain and left arm numbness, MRI is pending according to the patient Anxiety, insomnia: Doing okay with Ambien CR. RTC 3 months.

## 2019-04-28 ENCOUNTER — Encounter: Payer: Self-pay | Admitting: Internal Medicine

## 2019-05-01 ENCOUNTER — Telehealth: Payer: Self-pay | Admitting: Internal Medicine

## 2019-05-01 NOTE — Telephone Encounter (Signed)
Sent!

## 2019-05-01 NOTE — Telephone Encounter (Signed)
Refill request on Flexeril, Ambien and Celebrex.

## 2019-05-28 ENCOUNTER — Other Ambulatory Visit: Payer: Self-pay | Admitting: Internal Medicine

## 2019-05-31 HISTORY — PX: CERVICAL SPINE SURGERY: SHX589

## 2019-06-24 ENCOUNTER — Telehealth: Payer: Self-pay | Admitting: Internal Medicine

## 2019-06-25 NOTE — Telephone Encounter (Signed)
Sent!

## 2019-06-25 NOTE — Telephone Encounter (Signed)
Ambien refill.   Last OV: 04/22/2019 Last Fill: 05/01/2019 #30 and 1RF UDS: Ambien only

## 2019-07-17 ENCOUNTER — Other Ambulatory Visit: Payer: Self-pay

## 2019-07-17 ENCOUNTER — Encounter: Payer: Self-pay | Admitting: Internal Medicine

## 2019-07-17 ENCOUNTER — Ambulatory Visit: Payer: 59 | Admitting: Internal Medicine

## 2019-07-17 VITALS — BP 130/88 | HR 85 | Temp 98.7°F | Resp 16 | Ht 71.0 in | Wt 208.0 lb

## 2019-07-17 DIAGNOSIS — Z23 Encounter for immunization: Secondary | ICD-10-CM | POA: Diagnosis not present

## 2019-07-17 DIAGNOSIS — X58XXXA Exposure to other specified factors, initial encounter: Secondary | ICD-10-CM | POA: Diagnosis not present

## 2019-07-17 DIAGNOSIS — E118 Type 2 diabetes mellitus with unspecified complications: Secondary | ICD-10-CM | POA: Diagnosis not present

## 2019-07-17 DIAGNOSIS — I10 Essential (primary) hypertension: Secondary | ICD-10-CM | POA: Diagnosis not present

## 2019-07-17 NOTE — Progress Notes (Signed)
Pre visit review using our clinic review tool, if applicable. No additional management support is needed unless otherwise documented below in the visit note. 

## 2019-07-17 NOTE — Progress Notes (Signed)
Subjective:    Patient ID: Daniel [name], male    DOB: [birth year], 53 y.o.   MRN: [id number]  DOS:  07/17/2019 Type of visit - description: Routine follow-up HTN: Good compliance to medication Pain management: Now under the care of Dr. Arnoldo Morale DM: Last A1c excellent  Review of Systems He is slowly recovering from his major injuries. Able to walk again, unable to go as far as before. Emotionally doing well  Past Medical History:  Diagnosis Date  . Asthma   . Depression    h/o  . Diabetes mellitus   . GERD (gastroesophageal reflux disease)   . H/O Clostridium difficile infection 08/2015  . Hyperlipidemia   . Hypertension   . Insomnia   . Polyarthralgia 2009   blood work (-) CKs slightly elevated, bone san (-) saw rheumatology; continue w/ somptoms after holding zocor    Past Surgical History:  Procedure Laterality Date  . CERVICAL SPINE SURGERY  05/31/2019   operated on C4-C5  . HAND SURGERY Left 2006  . HEMORRHOID SURGERY    . HERNIA REPAIR  16/6063   umbilical  . KNEE SURGERY Right 2011   . NASAL FRACTURE SURGERY  2006    Social History   Socioeconomic History  . Marital status: Married    Spouse name: Not on file  . Number of children: 1  . Years of education: Not on file  . Highest education level: Not on file  Occupational History  . Occupation: Glass blower/designer at [employer]  . Occupation: retired Firefighter  Social Needs  . Financial resource strain: Not on file  . Food insecurity    Worry: Not on file    Inability: Not on file  . Transportation needs    Medical: Not on file    Non-medical: Not on file  Tobacco Use  . Smoking status: Passive Smoke Exposure - Never Smoker  . Smokeless tobacco: Never Used  . 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  Substance and Sexual Activity  . Alcohol use: Yes    Alcohol/week: 5.0 standard drinks    Types: 5 Cans of beer per week  . Drug use: No  . Sexual activity: Not  on file  Lifestyle  . Physical activity    Days per week: Not on file    Minutes per session: Not on file  . Stress: Not on file  Relationships  . Social Herbalist on phone: Not on file    Gets together: Not on file    Attends religious service: Not on file    Active member of club or organization: Not on file    Attends meetings of clubs or organizations: Not on file    Relationship status: Not on file  . Intimate partner violence    Fear of current or ex partner: Not on file    Emotionally abused: Not on file    Physically abused: Not on file    Forced sexual activity: Not on file  Other Topics Concern  . Not on file  Social History Narrative   Married, 1 adopted child                Allergies as of 07/17/2019      Reactions   Levofloxacin Other (See Comments)   Tendon rupture at wrist      Medication List       Accurate as of July 17, 2019 11:59 PM. If you  have any questions, ask your nurse or doctor.        acetaminophen 500 MG tablet Commonly known as: TYLENOL Take 2 tablets (1,000 mg total) by mouth every 6 (six) hours.   aspirin 81 MG tablet Take 81 mg by mouth daily.   carvedilol 12.5 MG tablet Commonly known as: COREG Take 1 tablet (12.5 mg total) by mouth 2 (two) times daily with a meal.   celecoxib 100 MG capsule Commonly known as: CELEBREX TAKE 1 CAPSULE(100 MG) BY MOUTH TWICE DAILY   cyclobenzaprine 10 MG tablet Commonly known as: FLEXERIL TAKE 1 TABLET(10 MG) BY MOUTH AT BEDTIME AS NEEDED FOR MUSCLE SPASMS   docusate sodium 100 MG capsule Commonly known as: COLACE Take 1 capsule (100 mg total) by mouth 2 (two) times daily.   fish oil-omega-3 fatty acids 1000 MG capsule Take 1 g by mouth daily.   FLAX SEED OIL PO Take 1 each by mouth at bedtime.   HYDROcodone-acetaminophen 10-325 MG tablet Commonly known as: NORCO Take 1/2 to 1 tablet by mouth q6h prn   losartan 100 MG tablet Commonly known as: COZAAR Take 1 tablet  (100 mg total) by mouth daily.   metFORMIN 850 MG tablet Commonly known as: Glucophage Take 1 tablet (850 mg total) by mouth 2 (two) times daily with a meal.   multivitamin,tx-minerals tablet Take 1 tablet by mouth daily.   niacin 500 MG tablet Take 500 mg by mouth at bedtime.   ONE TOUCH ULTRA TEST test strip Generic drug: glucose blood TEST three times a day   OneTouch Delica Lancets 03T Misc CHECK BLOOD SUGAR NO MORE THAN 3 TIMES A DAY   oxyCODONE 5 MG immediate release tablet Commonly known as: Oxy IR/ROXICODONE Take 1 tablet (5 mg total) by mouth every 6 (six) hours as needed.   oxyCODONE 10 mg 12 hr tablet Commonly known as: OXYCONTIN Take 1 tablet (10 mg total) by mouth every 12 (twelve) hours as needed.   pantoprazole 40 MG tablet Commonly known as: PROTONIX Take 1 tablet (40 mg total) by mouth daily before breakfast.   polyethylene glycol 17 g packet Commonly known as: MIRALAX / GLYCOLAX Take 17 g by mouth daily.   PROBIOTIC DAILY PO Take 1 tablet by mouth daily.   Red Yeast Rice 600 MG Caps Take 600 mg by mouth daily.   zolpidem 12.5 MG CR tablet Commonly known as: AMBIEN CR TAKE 1 TABLET(12.5 MG) BY MOUTH AT BEDTIME AS NEEDED FOR SLEEP           Objective:   Physical Exam BP 130/88 (BP Location: Left Arm, Patient Position: Sitting, Cuff Size: Small)   Pulse 85   Temp 98.7 F (37.1 C) (Oral)   Resp 16   Ht 5\' 11"  (1.803 m)   Wt 208 lb (94.3 kg)   SpO2 100%   BMI 29.01 kg/m  General:   Well developed, NAD, BMI noted. HEENT:  Normocephalic . Face symmetric, atraumatic Lungs:  CTA B Normal respiratory effort, no intercostal retractions, no accessory muscle use. Heart: RRR,  no murmur.  No pretibial edema bilaterally  Skin: Not pale. Not jaundice Neurologic:  alert & oriented X3.  Speech normal, gait limited but unassisted.  Transfers without help. Psych--  Cognition and judgment appear intact.  Cooperative with normal attention span  and concentration.  Behavior appropriate. No anxious or depressed appearing.      Assessment      Assessment  DM - dc metformin 08-2015, had diarrhea  but likely dt cdiff, ok to restart if needed  HTN Hyperlipidemia Anxiety, insomnia, History of Prozac intolerance "it may me like to hurt people".  Asthma GERD Insomnia Polyarthralgia: w/u and rheumatology eval neg 2009, stopping zocor did not help Diarrhea, + c diff 08-2015, s/p abx   PLAN: DM: On metformin, last A1c excellent HTN: Well-controlled, continue carvedilol losartan.  Monitor ambulatory BPs Multiple injuries: Status post cervical spine surgery last month Pain management now under Dr. Arnoldo Morale according to the patient.  Alternates oxycodone (quick and extended release) and hydrocodone. I will refill Celebrex and Flexeril when needed. Preventive care: Shingrix No. 2 today Encourage a flu shot this season RTC CPX 6 to 8 months

## 2019-07-17 NOTE — Patient Instructions (Signed)
  GO TO THE FRONT DESK Schedule your next appointment   for a physical exam in 6 to 8 months    Check the  blood pressure 2 or 3 times a month BP GOAL is between 110/65 and  135/85. If it is consistently higher or lower, let me know

## 2019-07-19 NOTE — Assessment & Plan Note (Signed)
  DM: On metformin, last A1c excellent HTN: Well-controlled, continue carvedilol losartan.  Monitor ambulatory BPs Multiple injuries: Status post cervical spine surgery last month Pain management now under Dr. Arnoldo Morale according to the patient.  Alternates oxycodone (quick and extended release) and hydrocodone. I will refill Celebrex and Flexeril when needed. Preventive care: Shingrix No. 2 today Encourage a flu shot this season RTC CPX 6 to 8 months

## 2019-07-24 ENCOUNTER — Other Ambulatory Visit: Payer: Self-pay | Admitting: Internal Medicine

## 2019-08-21 DIAGNOSIS — M4712 Other spondylosis with myelopathy, cervical region: Secondary | ICD-10-CM | POA: Insufficient documentation

## 2019-08-21 DIAGNOSIS — S22050D Wedge compression fracture of T5-T6 vertebra, subsequent encounter for fracture with routine healing: Secondary | ICD-10-CM | POA: Insufficient documentation

## 2019-09-20 ENCOUNTER — Other Ambulatory Visit: Payer: Self-pay | Admitting: Internal Medicine

## 2019-10-14 ENCOUNTER — Encounter: Payer: Self-pay | Admitting: Internal Medicine

## 2019-10-19 ENCOUNTER — Telehealth: Payer: Self-pay | Admitting: Internal Medicine

## 2019-10-20 NOTE — Telephone Encounter (Signed)
Refill medications. History of HSV infections, used to take Valtrex daily.

## 2019-10-20 NOTE — Telephone Encounter (Signed)
Refill for Valtrex- no longer on med list.   Refill for Celebrex  Last OV: 07/17/2019 Last Fill: 07/24/2019 #60 and 2RF Pt sig: 1 capsule bid  Refill for Ambien  Last OV: 07/17/2019 Last Fill: 06/25/2019 #30 and 3RF Pt sig: 1 tab qhs prn

## 2019-10-21 NOTE — Telephone Encounter (Signed)
Opened in error

## 2019-11-16 ENCOUNTER — Other Ambulatory Visit: Payer: Self-pay | Admitting: Internal Medicine

## 2019-11-17 ENCOUNTER — Ambulatory Visit (INDEPENDENT_AMBULATORY_CARE_PROVIDER_SITE_OTHER): Payer: 59

## 2019-11-17 ENCOUNTER — Encounter: Payer: Self-pay | Admitting: Internal Medicine

## 2019-11-17 ENCOUNTER — Other Ambulatory Visit: Payer: Self-pay

## 2019-11-17 DIAGNOSIS — Z23 Encounter for immunization: Secondary | ICD-10-CM

## 2019-11-17 NOTE — Progress Notes (Signed)
Pt came into the office today to get a flu shot, gave injection into the left deltoid, pt tolerated injection well and gave information sheet to the pt.

## 2019-12-15 ENCOUNTER — Other Ambulatory Visit: Payer: Self-pay | Admitting: Internal Medicine

## 2020-01-14 ENCOUNTER — Other Ambulatory Visit: Payer: Self-pay | Admitting: Internal Medicine

## 2020-01-28 ENCOUNTER — Other Ambulatory Visit: Payer: Self-pay

## 2020-01-28 DIAGNOSIS — G8921 Chronic pain due to trauma: Secondary | ICD-10-CM | POA: Insufficient documentation

## 2020-01-28 DIAGNOSIS — F4310 Post-traumatic stress disorder, unspecified: Secondary | ICD-10-CM | POA: Insufficient documentation

## 2020-01-29 ENCOUNTER — Other Ambulatory Visit: Payer: Self-pay

## 2020-01-29 ENCOUNTER — Ambulatory Visit (INDEPENDENT_AMBULATORY_CARE_PROVIDER_SITE_OTHER): Payer: 59 | Admitting: Internal Medicine

## 2020-01-29 ENCOUNTER — Telehealth: Payer: Self-pay

## 2020-01-29 ENCOUNTER — Encounter: Payer: Self-pay | Admitting: Internal Medicine

## 2020-01-29 VITALS — BP 123/87 | HR 84 | Temp 97.4°F | Resp 16 | Ht 70.0 in | Wt 213.5 lb

## 2020-01-29 DIAGNOSIS — Z0000 Encounter for general adult medical examination without abnormal findings: Secondary | ICD-10-CM | POA: Diagnosis not present

## 2020-01-29 DIAGNOSIS — E118 Type 2 diabetes mellitus with unspecified complications: Secondary | ICD-10-CM

## 2020-01-29 DIAGNOSIS — K219 Gastro-esophageal reflux disease without esophagitis: Secondary | ICD-10-CM | POA: Diagnosis not present

## 2020-01-29 DIAGNOSIS — E785 Hyperlipidemia, unspecified: Secondary | ICD-10-CM | POA: Diagnosis not present

## 2020-01-29 LAB — COMPREHENSIVE METABOLIC PANEL
ALT: 37 U/L (ref 0–53)
AST: 20 U/L (ref 0–37)
Albumin: 4.3 g/dL (ref 3.5–5.2)
Alkaline Phosphatase: 82 U/L (ref 39–117)
BUN: 17 mg/dL (ref 6–23)
CO2: 28 mEq/L (ref 19–32)
Calcium: 9.1 mg/dL (ref 8.4–10.5)
Chloride: 102 mEq/L (ref 96–112)
Creatinine, Ser: 1.15 mg/dL (ref 0.40–1.50)
GFR: 79.68 mL/min (ref >60.00)
Glucose, Bld: 111 mg/dL — ABNORMAL HIGH (ref 70–99)
Potassium: 4.1 mEq/L (ref 3.5–5.1)
Sodium: 138 mEq/L (ref 135–145)
Total Bilirubin: 0.7 mg/dL (ref 0.2–1.2)
Total Protein: 6.9 g/dL (ref 6.0–8.3)

## 2020-01-29 LAB — CBC WITH DIFFERENTIAL/PLATELET
Basophils Absolute: 0 10*3/uL (ref 0.0–0.1)
Basophils Relative: 0.5 % (ref 0.0–3.0)
Eosinophils Absolute: 0.9 10*3/uL — ABNORMAL HIGH (ref 0.0–0.7)
Eosinophils Relative: 10.9 % — ABNORMAL HIGH (ref 0.0–5.0)
HCT: 40.7 % (ref 39.0–52.0)
Hemoglobin: 14.3 g/dL (ref 13.0–17.0)
Lymphocytes Relative: 45.1 % (ref 12.0–46.0)
Lymphs Abs: 3.8 10*3/uL (ref 0.7–4.0)
MCHC: 35.2 g/dL (ref 30.0–36.0)
MCV: 90.6 fl (ref 78.0–100.0)
Monocytes Absolute: 0.6 10*3/uL (ref 0.1–1.0)
Monocytes Relative: 6.9 % (ref 3.0–12.0)
Neutro Abs: 3.1 10*3/uL (ref 1.4–7.7)
Neutrophils Relative %: 36.6 % — ABNORMAL LOW (ref 43.0–77.0)
Platelets: 135 10*3/uL — ABNORMAL LOW (ref 150.0–400.0)
RBC: 4.49 Mil/uL (ref 4.22–5.81)
RDW: 13.7 % (ref 11.5–15.5)
WBC: 8.4 10*3/uL (ref 4.0–10.5)

## 2020-01-29 LAB — LIPID PANEL
Cholesterol: 171 mg/dL (ref 0–200)
HDL: 35.2 mg/dL — ABNORMAL LOW (ref >39.00)
NonHDL: 136.26
Total CHOL/HDL Ratio: 5
Triglycerides: 291 mg/dL — ABNORMAL HIGH (ref 0.0–149.0)
VLDL: 58.2 mg/dL — ABNORMAL HIGH (ref 0.0–40.0)

## 2020-01-29 LAB — HEMOGLOBIN A1C: Hgb A1c MFr Bld: 5.8 % (ref 4.6–6.5)

## 2020-01-29 LAB — PSA: PSA: 1.02 ng/mL (ref 0.10–4.00)

## 2020-01-29 LAB — LDL CHOLESTEROL, DIRECT: Direct LDL: 90 mg/dL

## 2020-01-29 MED ORDER — ONETOUCH ULTRA VI STRP: ORAL_STRIP | 12 refills | Status: DC

## 2020-01-29 MED ORDER — PANTOPRAZOLE SODIUM 40 MG PO TBEC: 40.0000 mg | DELAYED_RELEASE_TABLET | Freq: Two times a day (BID) | ORAL | 3 refills | Status: DC

## 2020-01-29 MED ORDER — ONETOUCH DELICA LANCETS 33G MISC: 12 refills | Status: DC

## 2020-01-29 NOTE — Patient Instructions (Addendum)
Per our records you are due for an eye exam. Please contact your eye doctor to schedule an appointment. Please have them send copies of your office visit notes to [place]. Our fax number is [phone].   GO TO THE LAB : Get the blood work     [place] Come back for a checkup in 3 months, please make an appointment  Increase pantoprazole to:  1 tablet 30 minutes before breakfast and dinner on an empty stomach  Food Choices for Gastroesophageal Reflux Disease, Adult When you have gastroesophageal reflux disease (GERD), the foods you eat and your eating habits are very important. Choosing the right foods can help ease the discomfort of GERD. Consider working with a diet and nutrition specialist (dietitian) to help you make healthy food choices. What general guidelines should I follow?  Eating plan  Choose healthy foods low in fat, such as fruits, vegetables, whole grains, low-fat dairy products, and lean meat, fish, and poultry.  Eat frequent, small meals instead of three large meals each day. Eat your meals slowly, in a relaxed setting. Avoid bending over or lying down until 2-3 hours after eating.  Limit high-fat foods such as fatty meats or fried foods.  Limit your intake of oils, butter, and shortening to less than 8 teaspoons each day.  Avoid the following: ? Foods that cause symptoms. These may be different for different people. Keep a food diary to keep track of foods that cause symptoms. ? Alcohol. ? Drinking large amounts of liquid with meals. ? Eating meals during the 2-3 hours before bed.  Cook foods using methods other than frying. This may include baking, grilling, or broiling. Lifestyle  Maintain a healthy weight. Ask your health care provider what weight is healthy for you. If you need to lose weight, work with your health care provider to do so safely.  Exercise for at least 30 minutes on 5 or more days each week, or as told by your health care  provider.  Avoid wearing clothes that fit tightly around your waist and chest.  Do not use any products that contain nicotine or tobacco, such as cigarettes and e-cigarettes. If you need help quitting, ask your health care provider.  Sleep with the head of your bed raised. Use a wedge under the mattress or blocks under the bed frame to raise the head of the bed. What foods are not recommended? The items listed may not be a complete list. Talk with your dietitian about what dietary choices are best for you. Grains Pastries or quick breads with added fat. [place] toast. Vegetables Deep fried vegetables. [place] fries. Any vegetables prepared with added fat. Any vegetables that cause symptoms. For some people this may include tomatoes and tomato products, chili peppers, onions and garlic, and horseradish. Fruits Any fruits prepared with added fat. Any fruits that cause symptoms. For some people this may include citrus fruits, such as oranges, grapefruit, pineapple, and lemons. Meats and other protein foods High-fat meats, such as fatty beef or pork, hot dogs, ribs, ham, sausage, salami and bacon. Fried meat or protein, including fried fish and fried chicken. Nuts and nut butters. Dairy Whole milk and chocolate milk. Sour cream. Cream. Ice cream. Cream cheese. Milk shakes. Beverages Coffee and tea, with or without caffeine. Carbonated beverages. Sodas. Energy drinks. Fruit juice made with acidic fruits (such as orange or grapefruit). Tomato juice. Alcoholic drinks. Fats and oils Butter. Margarine. Shortening. Ghee. Sweets and desserts Chocolate and cocoa.  Donuts. Seasoning and other foods Pepper. Peppermint and spearmint. Any condiments, herbs, or seasonings that cause symptoms. For some people, this may include curry, hot sauce, or vinegar-based salad dressings. Summary  When you have gastroesophageal reflux disease (GERD), food and lifestyle choices are very important to help ease the  discomfort of GERD.  Eat frequent, small meals instead of three large meals each day. Eat your meals slowly, in a relaxed setting. Avoid bending over or lying down until 2-3 hours after eating.  Limit high-fat foods such as fatty meat or fried foods. This information is not intended to replace advice given to you by your health care provider. Make sure you discuss any questions you have with your health care provider. Document Revised: 03/26/2019 Document Reviewed: 12/04/2016 Elsevier Patient Education  [place].

## 2020-01-29 NOTE — Progress Notes (Signed)
Subjective:    Patient ID: Daniel [name], male    DOB: 19-Dec-1965, 54 y.o.   MRN: [id number]  DOS:  01/29/2020 Type of visit - description: CPX  Had a major injury a year ago, slowly recuperating.  Review of Systems   GERD symptoms have increased despite taking PPIs before breakfast every day.  Some heartburn throughout the day. Denies dysphagia, odynophagia, no nausea, vomiting, diarrhea.  No abdominal pain. Also, long history of normal stools but associated streaks of red/fresh blood with bowel movements.  Denies anal pain or itching.   Other than above, a 14 point review of systems is negative    Past Medical History:  Diagnosis Date  . Asthma   . Depression    h/o  . Diabetes mellitus   . GERD (gastroesophageal reflux disease)   . H/O Clostridium difficile infection 08/2015  . Hyperlipidemia   . Hypertension   . Insomnia   . Polyarthralgia 2009   blood work (-) CKs slightly elevated, bone san (-) saw rheumatology; continue w/ somptoms after holding zocor    Past Surgical History:  Procedure Laterality Date  . CERVICAL SPINE SURGERY  05/31/2019   operated on C4-C5  . HAND SURGERY Left 2006  . HEMORRHOID SURGERY    . HERNIA REPAIR  [id number]   umbilical  . KNEE SURGERY Right 2011   . NASAL FRACTURE SURGERY  2006   Family History  Problem Relation Age of Onset  . Diabetes Mother        ??  . Hypertension Mother   . Diabetes Father   . Prostate cancer Neg Hx   . Colon cancer Neg Hx   . Coronary artery disease Neg Hx   . Stroke Neg Hx     Allergies as of 01/29/2020      Reactions   Levofloxacin Other (See Comments)   Tendon rupture at wrist      Medication List       Accurate as of January 29, 2020 11:59 PM. If you have any questions, ask your nurse or doctor.        STOP taking these medications   niacin 500 MG tablet Stopped by: Kathlene November, MD     TAKE these medications   acetaminophen 500 MG tablet Commonly known as: TYLENOL Take 2 tablets  (1,000 mg total) by mouth every 6 (six) hours.   aspirin 81 MG tablet Take 81 mg by mouth daily.   carvedilol 12.5 MG tablet Commonly known as: COREG Take 1 tablet (12.5 mg total) by mouth 2 (two) times daily with a meal.   celecoxib 100 MG capsule Commonly known as: CELEBREX Take 1 capsule (100 mg total) by mouth 2 (two) times daily.   cyclobenzaprine 10 MG tablet Commonly known as: FLEXERIL Take 1 tablet (10 mg total) by mouth at bedtime as needed for muscle spasms.   fish oil-omega-3 fatty acids 1000 MG capsule Take 1 g by mouth daily.   FLAX SEED OIL PO Take 1 each by mouth at bedtime.   HYDROcodone-acetaminophen 10-325 MG tablet Commonly known as: NORCO Take 1/2 to 1 tablet by mouth q6h prn   losartan 100 MG tablet Commonly known as: COZAAR Take 1 tablet (100 mg total) by mouth daily.   metFORMIN 850 MG tablet Commonly known as: GLUCOPHAGE Take 1 tablet (850 mg total) by mouth 2 (two) times daily with a meal.   multivitamin,tx-minerals tablet Take 1 tablet by mouth daily.   OneTouch Delica Lancets [id number]  Misc Check blood sugars once daily What changed: additional instructions Changed by: Kathlene November, MD   OneTouch Ultra test strip Generic drug: glucose blood Check blood sugars once daily What changed:   when to take this  additional instructions Changed by: Kathlene November, MD   pantoprazole 40 MG tablet Commonly known as: PROTONIX Take 1 tablet (40 mg total) by mouth 2 (two) times daily before a meal. What changed: See the new instructions. Changed by: Kathlene November, MD   polyethylene glycol 17 g packet Commonly known as: MIRALAX / GLYCOLAX Take 17 g by mouth daily.   PROBIOTIC DAILY PO Take 1 tablet by mouth daily.   Red Yeast Rice 600 MG Caps Take 600 mg by mouth daily.   valACYclovir 500 MG tablet Commonly known as: VALTREX TAKE 1 TABLET(500 MG) BY MOUTH DAILY   zolpidem 12.5 MG CR tablet Commonly known as: AMBIEN CR TAKE 1 TABLET(12.5 MG) BY MOUTH AT  BEDTIME AS NEEDED FOR SLEEP             Objective:   Physical Exam BP 123/87 (BP Location: Left Arm, Patient Position: Sitting, Cuff Size: Normal)   Pulse 84   Temp (!) 97.4 F (36.3 C) (Temporal)   Resp 16   Ht 5\' 10"  (1.778 m)   Wt 213 lb 8 oz (96.8 kg)   SpO2 100%   BMI 30.63 kg/m  General: Well developed, NAD, BMI noted Neck: No  thyromegaly  HEENT:  Normocephalic . Face symmetric, atraumatic Lungs:  CTA B Normal respiratory effort, no intercostal retractions, no accessory muscle use. Heart: RRR,  no murmur.  No pretibial edema bilaterally  Abdomen:  Not distended, soft, non-tender. No rebound or rigidity.   Skin: Exposed areas without rash. Not pale. Not jaundice Neurologic:  alert & oriented X3.  Speech normal, gait unassisted and slow, transferring is also slow due to back discomfort DRE: Deferred to avoid MSK discomfort Psych: Cognition and judgment appear intact.  Cooperative with normal attention span and concentration.  Behavior appropriate. No anxious or depressed appearing.     Assessment    Assessment  DM - dc metformin 08-2015, had diarrhea but likely dt cdiff, ok to restart if needed  HTN Hyperlipidemia Anxiety, insomnia, History of Prozac intolerance "it may me like to hurt people".  Asthma GERD Insomnia Polyarthralgia: w/u and rheumatology eval neg 2009, stopping zocor did not help Diarrhea, + c diff 08-2015, s/p abx  Major injury at work 01/2019   PLAN: Here for CPX DM: Last A1c excellent, patient reports that he is less active compared to before his accident but still walks around 4 miles daily.  Diet has changed, not as careful, increased portions.  Patient is counseled.  Check A1c HTN: On losartan, carvedilol, ambulatory BPs normal. Hyperlipidemia: Not on statins, checking labs Anxiety insomnia: On amlodipine, controlled. Major injury: Slowly recuperating.  Been told needs left TKR. GERD: Symptoms have increased, he does take  pantoprazole before breakfast.  No dysphagia.  Recommend increase pantoprazole to twice daily temporarily and better diet habits.  See AVS.  Reassess in 3 months Red blood per rectum: On clinical grounds likely a benign distal pathology.  Further evaluation if he has anemia or symptoms increase.  Last colonoscopy 2015, thus is up-to-date on CCS. RTC 3 months   This visit occurred during the SARS-CoV-2 public health emergency.  Safety protocols were in place, including screening questions prior to the visit, additional usage of staff PPE, and extensive cleaning of exam room  while observing appropriate contact time as indicated for disinfecting solutions.

## 2020-01-29 NOTE — Telephone Encounter (Signed)
Received surgical clearance form from [facility]- Pt scheduled for L TKA on 03/07/20 w/ Dr. Hector Shade. Form placed in PCP red folder.

## 2020-01-29 NOTE — Progress Notes (Signed)
Pre visit review using our clinic review tool, if applicable. No additional management support is needed unless otherwise documented below in the visit note. 

## 2020-01-30 NOTE — Assessment & Plan Note (Signed)
Here for CPX DM: Last A1c excellent, patient reports that he is less active compared to before his accident but still walks around 4 miles daily.  Diet has changed, not as careful, increased portions.  Patient is counseled.  Check A1c HTN: On losartan, carvedilol, ambulatory BPs normal. Hyperlipidemia: Not on statins, checking labs Anxiety insomnia: On amlodipine, controlled. Major injury: Slowly recuperating.  Been told needs left TKR. GERD: Symptoms have increased, he does take pantoprazole before breakfast.  No dysphagia.  Recommend increase pantoprazole to twice daily temporarily and better diet habits.  See AVS.  Reassess in 3 months Red blood per rectum: On clinical grounds likely a benign distal pathology.  Further evaluation if he has anemia or symptoms increase.  Last colonoscopy 2015, thus is up-to-date on CCS. RTC 3 months

## 2020-01-30 NOTE — Assessment & Plan Note (Signed)
-   Td 2014 - pnm shot 2015 - prevnar 2017 - s/p Shingrix  - had a flu shot - s/p Covid #1 -CCS: Colonoscopy 09-2014, benign polyps, 10 years -Prostate cancer screening: DRE deferred today.  Check a PSA.  No symptoms. -Labs: CMP, FLP, CBC, A1c, PSA -Diet and exercise discussed

## 2020-02-01 NOTE — Telephone Encounter (Signed)
No known heart disease, not a smoker, BMI: 30, recent labs satisfactory, has denied chest pain or difficulty breathing at the time of his CPX. Stress test -08-2017. Okay to proceed with surgery, low risk, okay to hold aspirin if needed.

## 2020-02-02 NOTE — Telephone Encounter (Signed)
Surgical clearance form, OV note and labs from 01/29/20, EKG from 03/27/2017, and stress test from 09/11/2017 faxed to ATTN: Glendale Chard at [facility] at [phone]. Form sent for scanning. Received fax confirmation.

## 2020-02-06 IMAGING — DX DG CHEST 1V
2 series · 2 of 2 positions shown · non-contrast
Comparison: 01/11/2016

CLINICAL DATA: Chest, rib, and back pain after crush injury.

EXAM:
CHEST  1 VIEW

[chest ap (1 of 2)]
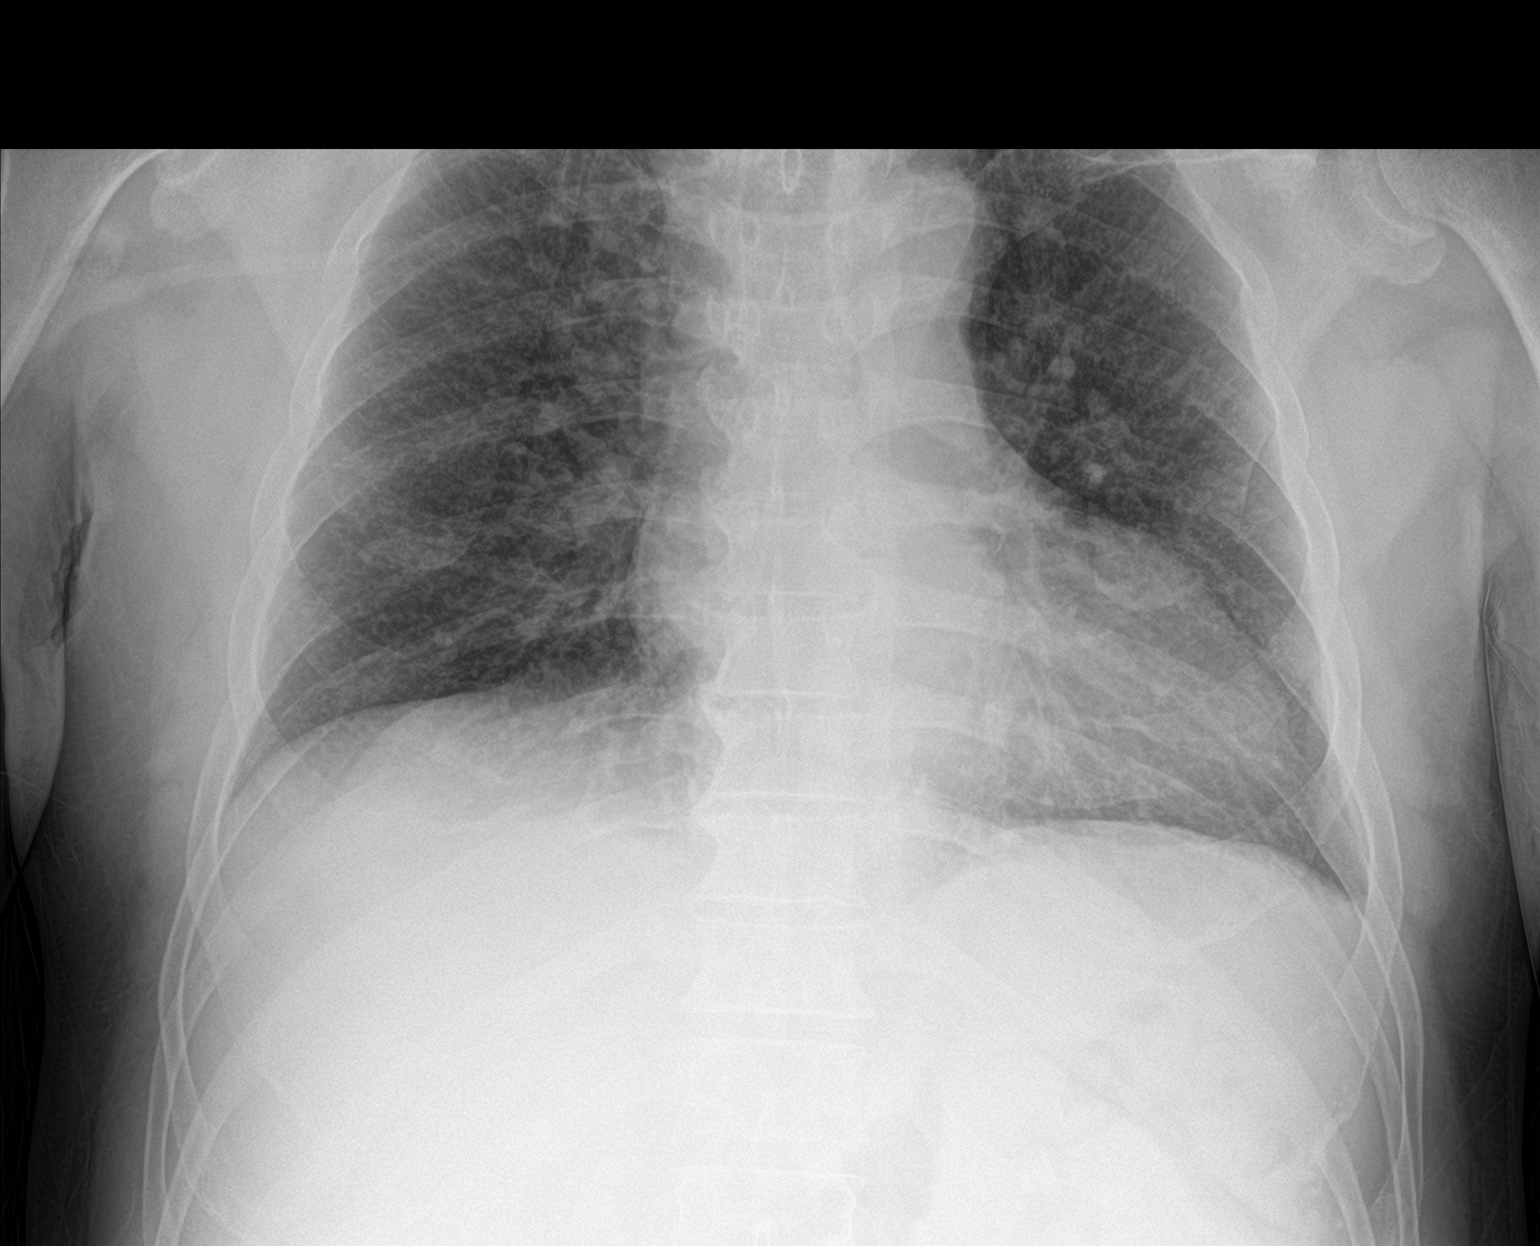

[chest ap (2 of 2)]
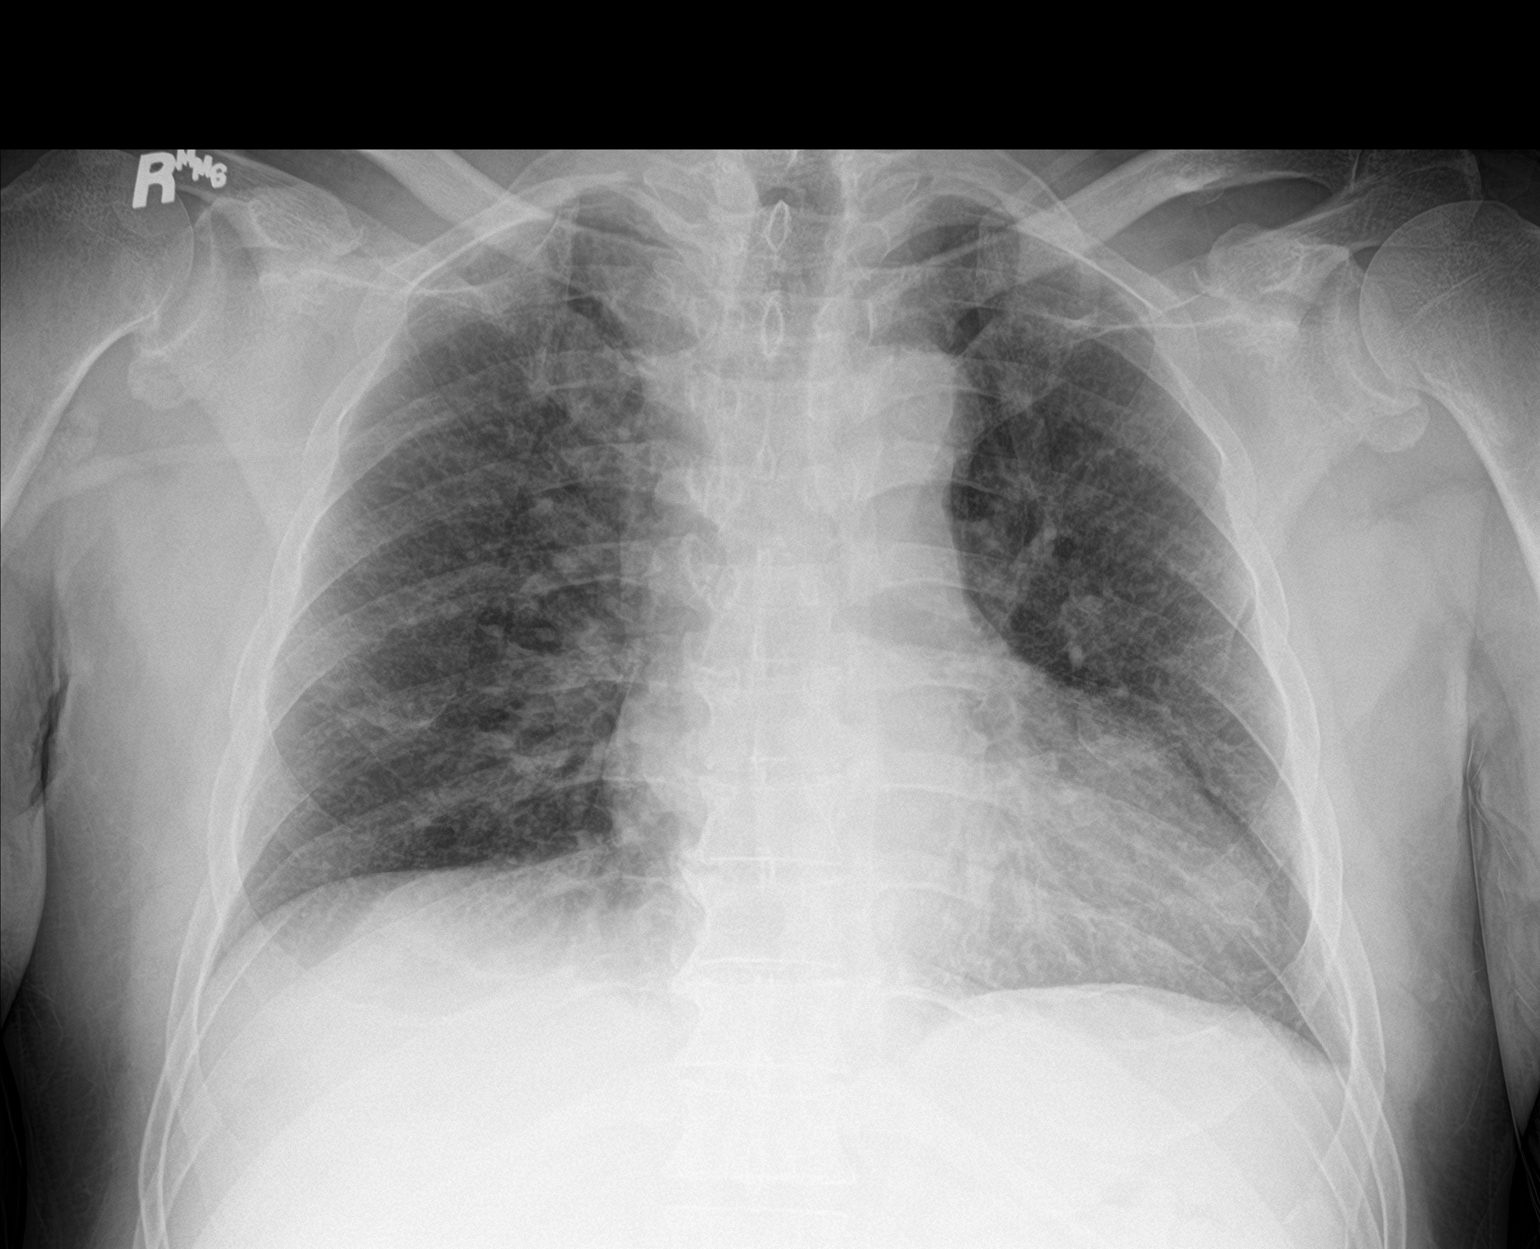

[2 of 2 positions shown; findings below may reference images not displayed]

FINDINGS: Shallow inspiration. Mild cardiac enlargement. No vascular
congestion, edema, or consolidation. No blunting of costophrenic
angles. No pneumothorax. Mediastinal contours appear intact.
Degenerative changes in the spine.
IMPRESSION: No active disease.

## 2020-02-06 IMAGING — CT CT CHEST W/ CM
2 of 5 series · 12 of 36 positions shown, 15 images · IV contrast (APPLIED)
Comparison: Lumbar spine CT dated 02/09/2019 and chest radiograph
dated 02/09/2019

CLINICAL DATA: 52-year-old male with blunt trauma to the chest.
Patient complaining of lower back pain.

EXAM:
CT CHEST, ABDOMEN, AND PELVIS WITH CONTRAST
TECHNIQUE: Multidetector CT imaging of the chest, abdomen and pelvis was
performed following the standard protocol during bolus
administration of intravenous contrast.
CONTRAST:  100mL OMNIPAQUE IOHEXOL 300 MG/ML  SOLN

[Series 3: cap 5.0 i31f 2 · axial · 0.85mm/px · z∈[+830,+1396]mm · 9 of 137 slices shown, 12 images]
[im 12/137  mediastinal]
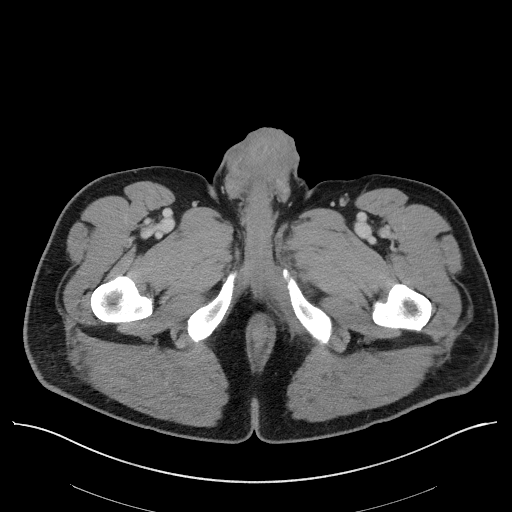
[im 12/137  lung]
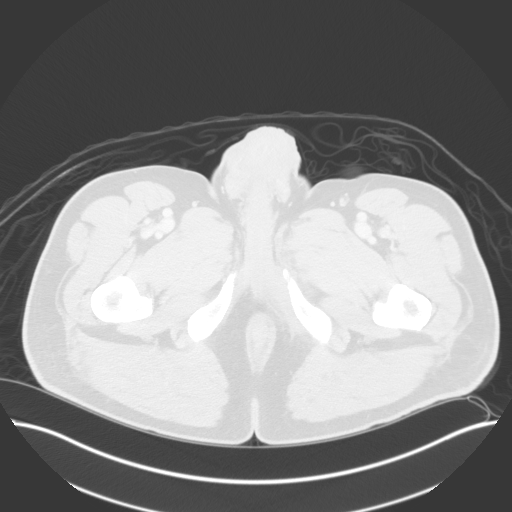
[im 23/137  lung]
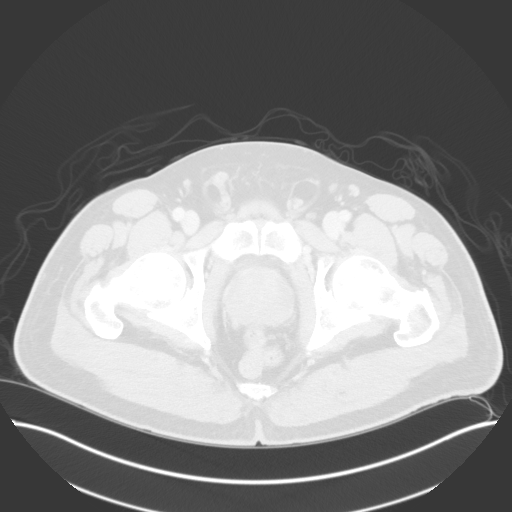
[im 46/137  lung]
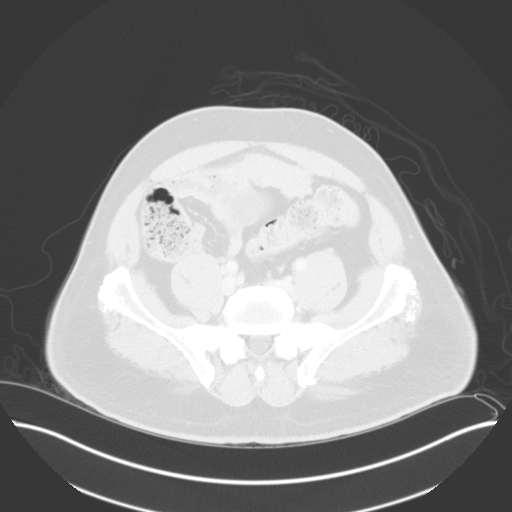
[im 57/137  lung]
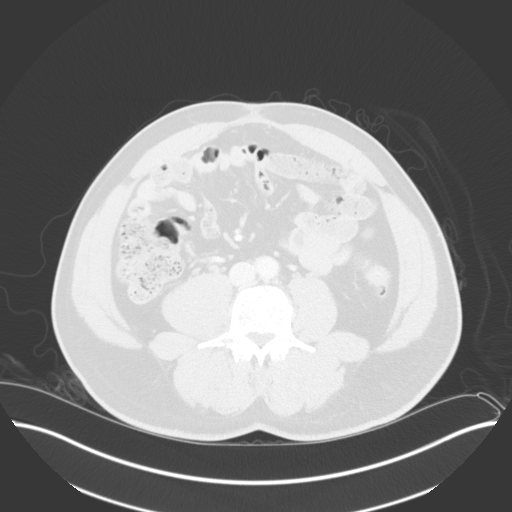
[im 69/137  mediastinal]
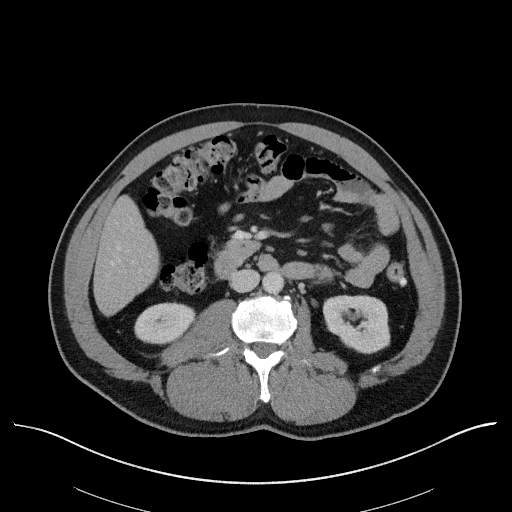
[im 69/137  lung]
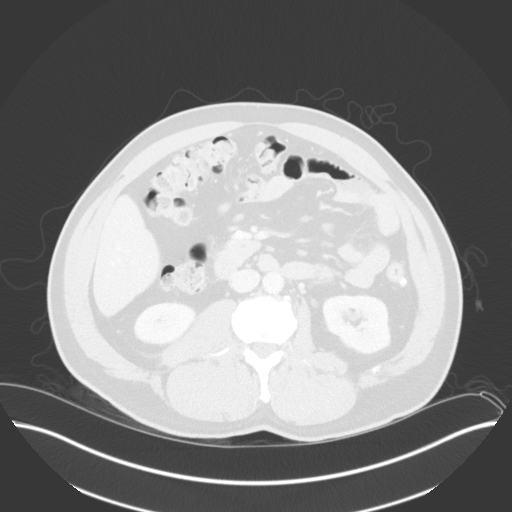
[im 80/137  lung]
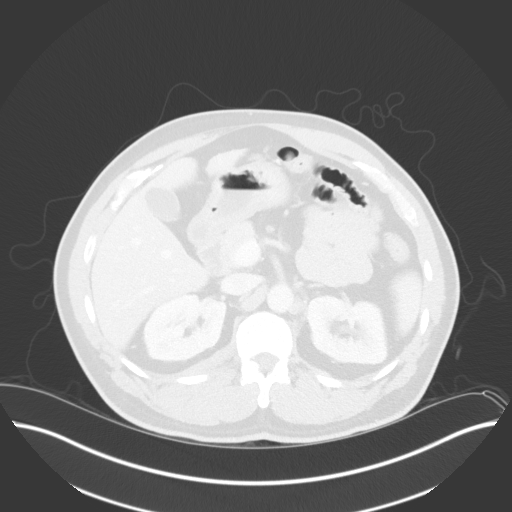
[im 91/137  lung]
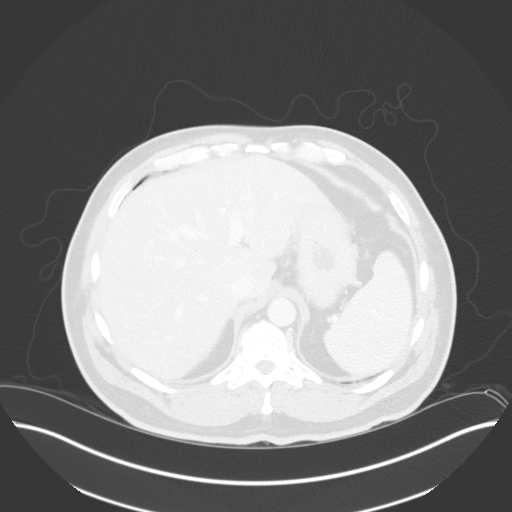
[im 114/137  lung]
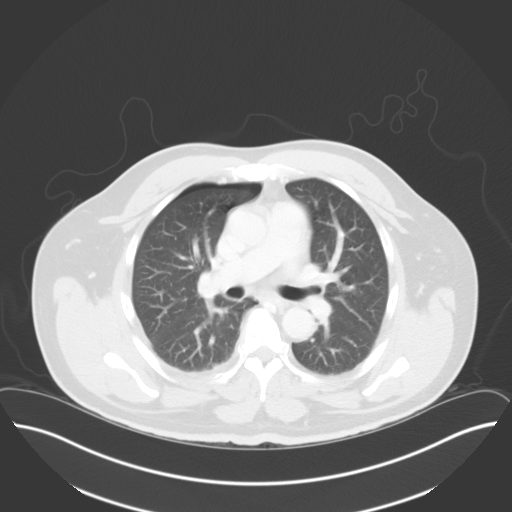
[im 125/137  mediastinal]
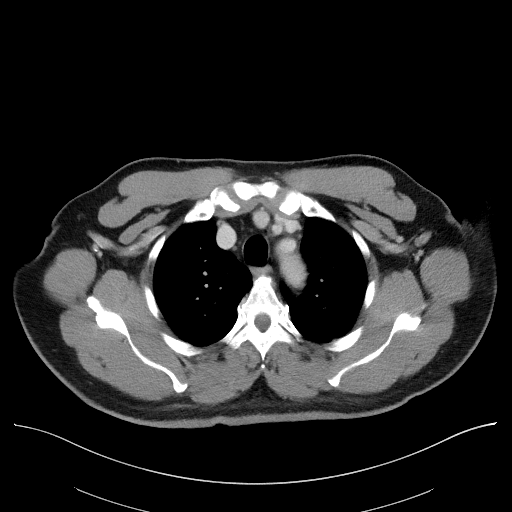
[im 125/137  lung]
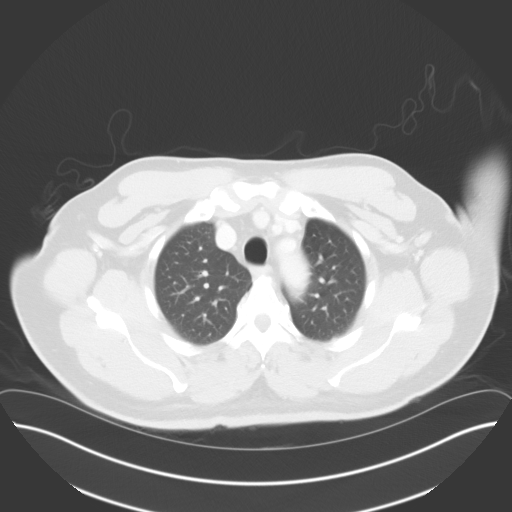

[Series 6: coronal · coronal · 0.78mm/px · 3 of 160 slices shown]
[im 32/160  lung]
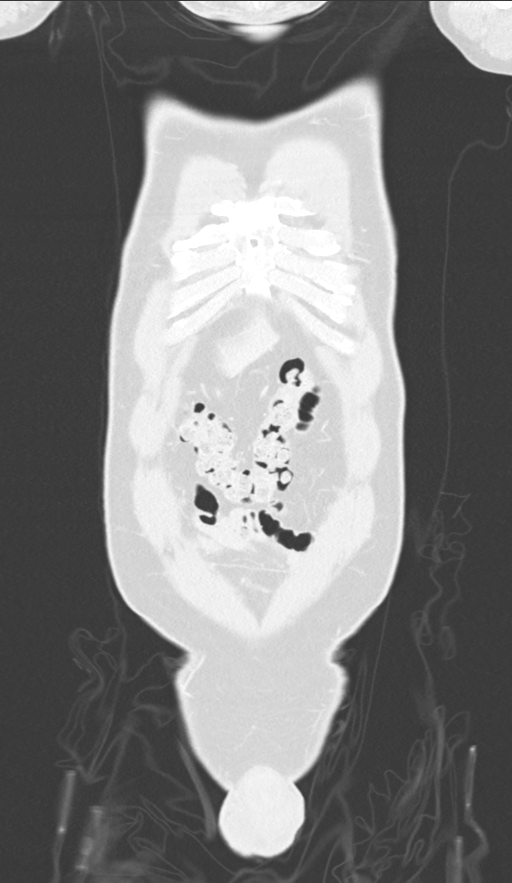
[im 64/160  lung]
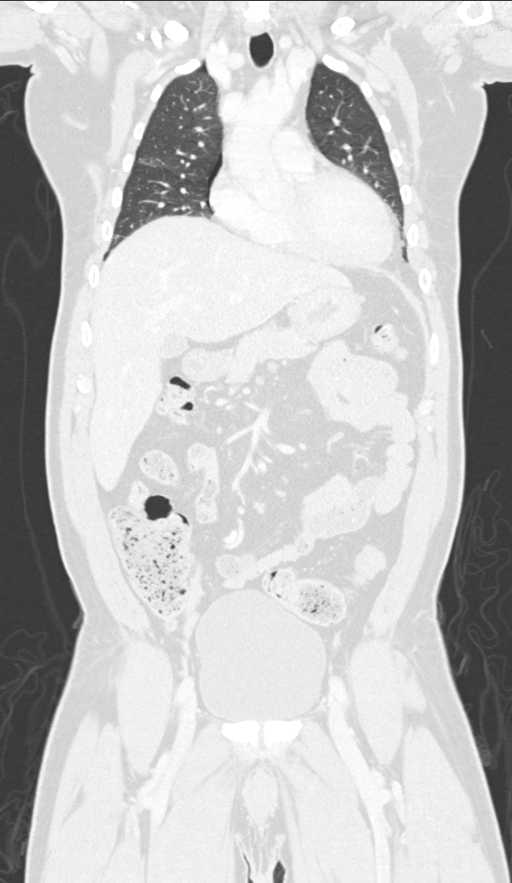
[im 96/160  lung]
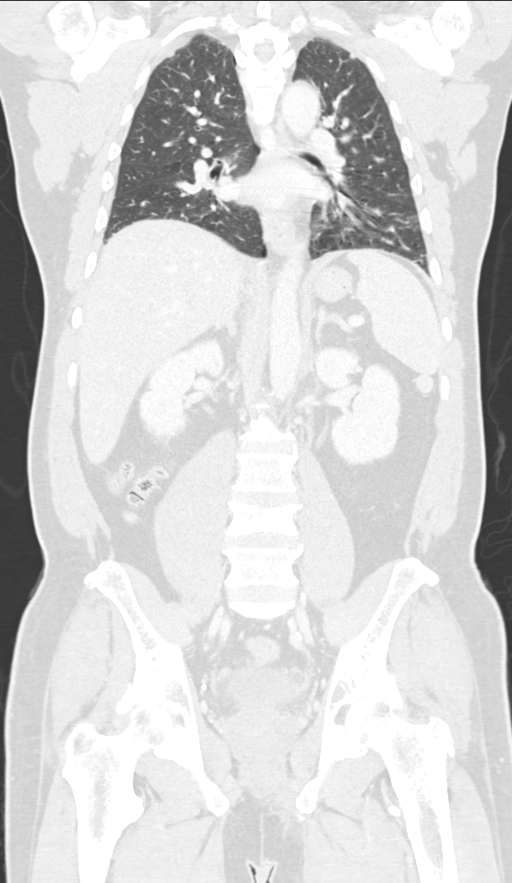

[12 of 36 positions shown; findings below may reference images not displayed]

FINDINGS: CT CHEST FINDINGS

Cardiovascular: Top-normal cardiac size. No pericardial effusion.
The thoracic aorta is unremarkable. The visualized origins of the
great vessels of the aortic arch appear patent. The central
pulmonary arteries are patent as visualized.

Mediastinum/Nodes: There is no hilar or mediastinal adenopathy. The
esophagus and the thyroid gland are grossly unremarkable. No
mediastinal fluid collection.

Lungs/Pleura: There is a small right pneumothorax (less than 20%).
There is no focal consolidation, or pleural effusion. The central
airways are patent.

Musculoskeletal: Multiple nondisplaced posterior right rib fractures
involving the seventh-ninth ribs. There is minimal age indeterminate
compression fracture of the superior endplate of T6, likely chronic.
Clinical correlation is recommended. No retropulsed fragment.
Minimally displaced fracture of the right transverse process of T12
at the costovertebral junction. Nondisplaced linear fracture of the
right transverse process of T10 and T11.

CT ABDOMEN PELVIS FINDINGS

No intra-abdominal free air or free fluid.

Hepatobiliary: No focal liver abnormality is seen. No gallstones,
gallbladder wall thickening, or biliary dilatation.

Pancreas: Unremarkable. No pancreatic ductal dilatation or
surrounding inflammatory changes.

Spleen: Normal in size without focal abnormality.

Adrenals/Urinary Tract: Adrenal glands are unremarkable. Kidneys are
normal, without renal calculi, focal lesion, or hydronephrosis.
Bladder is unremarkable.

Stomach/Bowel: There is distal colonic diverticulosis without active
inflammatory changes. There is no bowel obstruction or active
inflammation. Normal appendix.

Vascular/Lymphatic: No significant vascular findings are present. No
enlarged abdominal or pelvic lymph nodes.

Reproductive: The prostate and seminal vesicles are grossly
unremarkable.

Other: Thickening and nodularity of the subcutaneous soft tissues at
the umbilicus likely postsurgical scarring. No fluid collection.

Musculoskeletal: Age indeterminate fracture of the superior endplate
of L4 with approximately 30% loss of vertebral body height.
Correlation with clinical exam and point tenderness recommended.
Mildly displaced fractures of the right transverse processes of the
L1-L4.
IMPRESSION: 1. Small right pneumothorax.
2. Multiple nondisplaced right posterior rib fractures as well as
nondisplaced right thoracic transverse process fractures. Multilevel
displaced lumbar spine right transverse process fractures.
3. Age indeterminate mild compression fractures of T6 and L4.
Correlation with clinical exam and point tenderness recommended.
4. No acute/traumatic intra-abdominal or pelvic solid organ or
hollow viscus injury.

These results were called by telephone at the time of interpretation
on 02/09/2019 at [DATE] to Dr. MURA IWAN RINK , who verbally
acknowledged these results.

## 2020-02-11 ENCOUNTER — Telehealth: Payer: Self-pay | Admitting: Internal Medicine

## 2020-02-12 NOTE — Telephone Encounter (Signed)
Ambien refill.   Last OV: 01/29/20 Last Fill: 10/20/2019 #30 and 3RF Pt sig: 1 tab qhs prn UDS: 04/22/2019 Low risk

## 2020-02-12 NOTE — Telephone Encounter (Signed)
PDMP okay, RF sent 
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
## 2020-02-25 NOTE — Progress Notes (Signed)
PCP - Kathlene November, MD w/surgical clearance dated 02-01-20 Cardiologist -   Chest x-ray -  EKG -  Stress Test -  ECHO -  Cardiac Cath -   Sleep Study -  CPAP -   Fasting Blood Sugar -  Checks Blood Sugar _____ times a day  Blood Thinner Instructions:  Aspirin Instructions: Okay to hold Asa if needed, per Dr. Larose Kells Last Dose:  Anesthesia review:   Patient denies shortness of breath, fever, cough and chest pain at PAT appointment   Patient verbalized understanding of instructions that were given to them at the PAT appointment. Patient was also instructed that they will need to review over the PAT instructions again at home before surgery.

## 2020-02-25 NOTE — Patient Instructions (Addendum)
DUE TO COVID-19 ONLY ONE VISITOR IS ALLOWED TO COME WITH YOU AND STAY IN THE WAITING ROOM ONLY DURING PRE OP AND PROCEDURE DAY OF SURGERY. THE 1 VISITOR MAY VISIT WITH YOU AFTER SURGERY IN YOUR PRIVATE ROOM DURING VISITING HOURS ONLY!  YOU NEED TO HAVE A COVID 19 TEST ON 03-03-20 @ 3:00 PM, THIS TEST MUST BE DONE BEFORE SURGERY, COME  [place], [place] , [id number].  ([place]) ONCE YOUR COVID TEST IS COMPLETED, PLEASE BEGIN THE QUARANTINE INSTRUCTIONS AS OUTLINED IN YOUR HANDOUT.                Daniel [name]  02/25/2020   Your procedure is scheduled on: 03-07-20   Report to [facility] Main  Entrance    Report to Admitting at 11:15 AM     Call this number if you have problems the morning of surgery [phone]    Remember: NO SOLID FOOD AFTER MIDNIGHT THE NIGHT PRIOR TO SURGERY. NOTHING BY MOUTH EXCEPT CLEAR LIQUIDS UNTIL 10:45 AM . PLEASE FINISH ENSURE DRINK PER SURGEON ORDER  WHICH NEEDS TO BE COMPLETED AT 10:45 AM.   CLEAR LIQUID DIET   Foods Allowed                                                                     Foods Excluded  Coffee and tea, regular and decaf                             liquids that you cannot  Plain Jell-O any favor except red or purple                                           see through such as: Fruit ices (not with fruit pulp)                                     milk, soups, orange juice  Iced Popsicles                                    All solid food Carbonated beverages, regular and diet                                    Cranberry, grape and apple juices Sports drinks like Gatorade Lightly seasoned clear broth or consume(fat free) Sugar, honey syrup  _____________________________________________________________________       Take these medicines the morning of surgery with A SIP OF WATER: Carvedilol (Coreg), and Pantoprazole (Protonix)   BRUSH YOUR TEETH MORNING OF SURGERY AND RINSE YOUR MOUTH OUT, NO  CHEWING GUM CANDY OR MINTS.    DO NOT TAKE ANY DIABETIC MEDICATIONS DAY OF YOUR SURGERY  You may not have any metal on your body including hair pins and              piercings     Do not wear jewelry, cologne, lotions, powders or deodorant                     Men may shave face and neck.   Do not bring valuables to the hospital. [place].  Contacts, dentures or bridgework may not be worn into surgery.  You may bring an overnight bag     Special Instructions: N/A              Please read over the following fact sheets you were given: _____________________________________________________________________  How to Manage Your Diabetes Before and After Surgery  Why is it important to control my blood sugar before and after surgery? . Improving blood sugar levels before and after surgery helps healing and can limit problems. . A way of improving blood sugar control is eating a healthy diet by: o  Eating less sugar and carbohydrates o  Increasing activity/exercise o  Talking with your doctor about reaching your blood sugar goals . High blood sugars (greater than 180 mg/dL) can raise your risk of infections and slow your recovery, so you will need to focus on controlling your diabetes during the weeks before surgery. . Make sure that the doctor who takes care of your diabetes knows about your planned surgery including the date and location.  How do I manage my blood sugar before surgery? . Check your blood sugar at least 4 times a day, starting 2 days before surgery, to make sure that the level is not too high or low. o Check your blood sugar the morning of your surgery when you wake up and every 2 hours until you get to the Short Stay unit. . If your blood sugar is less than 70 mg/dL, you will need to treat for low blood sugar: o Do not take insulin. o Treat a low blood sugar (less than 70 mg/dL) with   cup of clear juice (cranberry or apple), 4 glucose tablets, OR glucose gel. o Recheck blood sugar in 15 minutes after treatment (to make sure it is greater than 70 mg/dL). If your blood sugar is not greater than 70 mg/dL on recheck, call [phone] for further instructions. . Report your blood sugar to the short stay nurse when you get to Short Stay.  . If you are admitted to the hospital after surgery: o Your blood sugar will be checked by the staff and you will probably be given insulin after surgery (instead of oral diabetes medicines) to make sure you have good blood sugar levels. o The goal for blood sugar control after surgery is 80-180 mg/dL.   WHAT DO I DO ABOUT MY DIABETES MEDICATION?  Marland Kitchen Do not take oral diabetes medicines (pills) the morning of surgery.  . THE DAY BEFORE SURGERY, take your usual Metformin.                  [facility] - Preparing for Surgery Before surgery, you can play an important role.  Because skin is not sterile, your skin needs to be as free of germs as possible.  You can reduce the number of germs on your skin by washing with CHG (chlorahexidine gluconate) soap before surgery.  CHG  is an antiseptic cleaner which kills germs and bonds with the skin to continue killing germs even after washing. Please DO NOT use if you have an allergy to CHG or antibacterial soaps.  If your skin becomes reddened/irritated stop using the CHG and inform your nurse when you arrive at Short Stay. Do not shave (including legs and underarms) for at least 48 hours prior to the first CHG shower.  You may shave your face/neck. Please follow these instructions carefully:  1.  Shower with CHG Soap the night before surgery and the  morning of Surgery.  2.  If you choose to wash your hair, wash your hair first as usual with your  normal  shampoo.  3.  After you shampoo, rinse your hair and body thoroughly to remove the  shampoo.                           4.  Use CHG as you would any  other liquid soap.  You can apply chg directly  to the skin and wash                       Gently with a scrungie or clean washcloth.  5.  Apply the CHG Soap to your body ONLY FROM THE NECK DOWN.   Do not use on face/ open                           Wound or open sores. Avoid contact with eyes, ears mouth and genitals (private parts).                       Wash face,  Genitals (private parts) with your normal soap.             6.  Wash thoroughly, paying special attention to the area where your surgery  will be performed.  7.  Thoroughly rinse your body with warm water from the neck down.  8.  DO NOT shower/wash with your normal soap after using and rinsing off  the CHG Soap.                9.  Pat yourself dry with a clean towel.            10.  Wear clean pajamas.            11.  Place clean sheets on your bed the night of your first shower and do not  sleep with pets. Day of Surgery : Do not apply any lotions/deodorants the morning of surgery.  Please wear clean clothes to the hospital/surgery center.  FAILURE TO FOLLOW THESE INSTRUCTIONS MAY RESULT IN THE CANCELLATION OF YOUR SURGERY PATIENT SIGNATURE_________________________________  NURSE SIGNATURE__________________________________  ________________________________________________________________________   Adam Phenix  An incentive spirometer is a tool that can help keep your lungs clear and active. This tool measures how well you are filling your lungs with each breath. Taking long deep breaths may help reverse or decrease the chance of developing breathing (pulmonary) problems (especially infection) following:  A long period of time when you are unable to move or be active. BEFORE THE PROCEDURE   If the spirometer includes an indicator to show your best effort, your nurse or respiratory therapist will set it to a desired goal.  If possible, sit up straight or lean slightly forward. Try not to slouch.  Hold  the  incentive spirometer in an upright position. INSTRUCTIONS FOR USE  1. Sit on the edge of your bed if possible, or sit up as far as you can in bed or on a chair. 2. Hold the incentive spirometer in an upright position. 3. Breathe out normally. 4. Place the mouthpiece in your mouth and seal your lips tightly around it. 5. Breathe in slowly and as deeply as possible, raising the piston or the ball toward the top of the column. 6. Hold your breath for 3-5 seconds or for as long as possible. Allow the piston or ball to fall to the bottom of the column. 7. Remove the mouthpiece from your mouth and breathe out normally. 8. Rest for a few seconds and repeat Steps 1 through 7 at least 10 times every 1-2 hours when you are awake. Take your time and take a few normal breaths between deep breaths. 9. The spirometer may include an indicator to show your best effort. Use the indicator as a goal to work toward during each repetition. 10. After each set of 10 deep breaths, practice coughing to be sure your lungs are clear. If you have an incision (the cut made at the time of surgery), support your incision when coughing by placing a pillow or rolled up towels firmly against it. Once you are able to get out of bed, walk around indoors and cough well. You may stop using the incentive spirometer when instructed by your caregiver.  RISKS AND COMPLICATIONS  Take your time so you do not get dizzy or light-headed.  If you are in pain, you may need to take or ask for pain medication before doing incentive spirometry. It is harder to take a deep breath if you are having pain. AFTER USE  Rest and breathe slowly and easily.  It can be helpful to keep track of a log of your progress. Your caregiver can provide you with a simple table to help with this. If you are using the spirometer at home, follow these instructions: [place] IF:   You are having difficultly using the spirometer.  You have trouble using  the spirometer as often as instructed.  Your pain medication is not giving enough relief while using the spirometer.  You develop fever of 100.5 F (38.1 C) or higher. SEEK IMMEDIATE MEDICAL CARE IF:   You cough up bloody sputum that had not been present before.  You develop fever of 102 F (38.9 C) or greater.  You develop worsening pain at or near the incision site. MAKE SURE YOU:   Understand these instructions.  Will watch your condition.  Will get help right away if you are not doing well or get worse. Document Released: 04/15/2007 Document Revised: 02/25/2012 Document Reviewed: 06/16/2007 Exit[employer] Patient Information 2014 [employer], [place].   ________________________________________________________________________  WHAT IS A BLOOD TRANSFUSION? Blood Transfusion Information  A transfusion is the replacement of blood or some of its parts. Blood is made up of multiple cells which provide different functions.  Red blood cells carry oxygen and are used for blood loss replacement.  White blood cells fight against infection.  Platelets control bleeding.  Plasma helps clot blood.  Other blood products are available for specialized needs, such as hemophilia or other clotting disorders. BEFORE THE TRANSFUSION  Who gives blood for transfusions?   Healthy volunteers who are fully evaluated to make sure their blood is safe. This is blood bank blood. Transfusion therapy is the safest it has ever been  in the practice of medicine. Before blood is taken from a donor, a complete history is taken to make sure that person has no history of diseases nor engages in risky social behavior (examples are intravenous drug use or sexual activity with multiple partners). The donor's travel history is screened to minimize risk of transmitting infections, such as malaria. The donated blood is tested for signs of infectious diseases, such as HIV and hepatitis. The blood is then tested to be sure it  is compatible with you in order to minimize the chance of a transfusion reaction. If you or a relative donates blood, this is often done in anticipation of surgery and is not appropriate for emergency situations. It takes many days to process the donated blood. RISKS AND COMPLICATIONS Although transfusion therapy is very safe and saves many lives, the main dangers of transfusion include:   Getting an infectious disease.  Developing a transfusion reaction. This is an allergic reaction to something in the blood you were given. Every precaution is taken to prevent this. The decision to have a blood transfusion has been considered carefully by your caregiver before blood is given. Blood is not given unless the benefits outweigh the risks. AFTER THE TRANSFUSION  Right after receiving a blood transfusion, you will usually feel much better and more energetic. This is especially true if your red blood cells have gotten low (anemic). The transfusion raises the level of the red blood cells which carry oxygen, and this usually causes an energy increase.  The nurse administering the transfusion will monitor you carefully for complications. HOME CARE INSTRUCTIONS  No special instructions are needed after a transfusion. You may find your energy is better. Speak with your caregiver about any limitations on activity for underlying diseases you may have. SEEK MEDICAL CARE IF:   Your condition is not improving after your transfusion.  You develop redness or irritation at the intravenous (IV) site. SEEK IMMEDIATE MEDICAL CARE IF:  Any of the following symptoms occur over the next 12 hours:  Shaking chills.  You have a temperature by mouth above 102 F (38.9 C), not controlled by medicine.  Chest, back, or muscle pain.  People around you feel you are not acting correctly or are confused.  Shortness of breath or difficulty breathing.  Dizziness and fainting.  You get a rash or develop hives.  You  have a decrease in urine output.  Your urine turns a dark color or changes to pink, red, or brown. Any of the following symptoms occur over the next 10 days:  You have a temperature by mouth above 102 F (38.9 C), not controlled by medicine.  Shortness of breath.  Weakness after normal activity.  The white part of the eye turns yellow (jaundice).  You have a decrease in the amount of urine or are urinating less often.  Your urine turns a dark color or changes to pink, red, or brown. Document Released: 11/30/2000 Document Revised: 02/25/2012 Document Reviewed: 07/19/2008 [facility] Patient Information 2014 [place], [place].  _______________________________________________________________________

## 2020-02-26 ENCOUNTER — Encounter (HOSPITAL_COMMUNITY)
Admission: RE | Admit: 2020-02-26 | Discharge: 2020-02-26 | Disposition: A | Discharge status: Home / Self Care | Payer: No Typology Code available for payment source | Source: Ambulatory Visit | Attending: Orthopedic Surgery | Admitting: Orthopedic Surgery

## 2020-02-26 ENCOUNTER — Encounter (HOSPITAL_COMMUNITY): Payer: Self-pay

## 2020-02-26 ENCOUNTER — Other Ambulatory Visit: Payer: Self-pay

## 2020-02-26 DIAGNOSIS — E118 Type 2 diabetes mellitus with unspecified complications: Secondary | ICD-10-CM | POA: Diagnosis not present

## 2020-02-26 DIAGNOSIS — Z20822 Contact with and (suspected) exposure to covid-19: Secondary | ICD-10-CM | POA: Insufficient documentation

## 2020-02-26 DIAGNOSIS — Z01818 Encounter for other preprocedural examination: Secondary | ICD-10-CM | POA: Insufficient documentation

## 2020-02-26 LAB — COMPREHENSIVE METABOLIC PANEL
ALT: 38 U/L (ref 0–44)
AST: 24 U/L (ref 15–41)
Albumin: 4.3 g/dL (ref 3.5–5.0)
Alkaline Phosphatase: 82 U/L (ref 38–126)
Anion gap: 11 (ref 5–15)
BUN: 13 mg/dL (ref 6–20)
CO2: 24 mmol/L (ref 22–32)
Calcium: 8.9 mg/dL (ref 8.9–10.3)
Chloride: 103 mmol/L (ref 98–111)
Creatinine, Ser: 1.04 mg/dL (ref 0.61–1.24)
GFR calc Af Amer: >60 mL/min (ref >60)
GFR calc non Af Amer: >60 mL/min (ref >60)
Glucose, Bld: 235 mg/dL — ABNORMAL HIGH (ref 70–99)
Potassium: 3.8 mmol/L (ref 3.5–5.1)
Sodium: 138 mmol/L (ref 135–145)
Total Bilirubin: 0.8 mg/dL (ref 0.3–1.2)
Total Protein: 7.2 g/dL (ref 6.5–8.1)

## 2020-02-26 LAB — CBC
HCT: 40.5 % (ref 39.0–52.0)
Hemoglobin: 14.6 g/dL (ref 13.0–17.0)
MCH: 31.9 pg (ref 26.0–34.0)
MCHC: 36 g/dL (ref 30.0–36.0)
MCV: 88.4 fL (ref 80.0–100.0)
Platelets: 159 10*3/uL (ref 150–400)
RBC: 4.58 MIL/uL (ref 4.22–5.81)
RDW: 13.1 % (ref 11.5–15.5)
WBC: 7.2 10*3/uL (ref 4.0–10.5)
nRBC: 0 % (ref 0.0–0.2)

## 2020-02-26 LAB — PROTIME-INR
INR: 0.9 (ref 0.8–1.2)
Prothrombin Time: 12.2 seconds (ref 11.4–15.2)

## 2020-02-26 LAB — SURGICAL PCR SCREEN
MRSA, PCR: NEGATIVE
Staphylococcus aureus: NEGATIVE

## 2020-02-26 LAB — GLUCOSE, CAPILLARY: Glucose-Capillary: 226 mg/dL — ABNORMAL HIGH (ref 70–99)

## 2020-02-26 LAB — APTT: aPTT: 25 seconds (ref 24–36)

## 2020-02-27 LAB — ABO/RH: ABO/RH(D): O POS

## 2020-03-03 ENCOUNTER — Other Ambulatory Visit (HOSPITAL_COMMUNITY)
Admission: RE | Admit: 2020-03-03 | Discharge: 2020-03-03 | Disposition: A | Discharge status: Home / Self Care | Payer: 59 | Source: Ambulatory Visit | Attending: Orthopedic Surgery | Admitting: Orthopedic Surgery

## 2020-03-03 DIAGNOSIS — Z01812 Encounter for preprocedural laboratory examination: Secondary | ICD-10-CM | POA: Insufficient documentation

## 2020-03-03 DIAGNOSIS — Z20822 Contact with and (suspected) exposure to covid-19: Secondary | ICD-10-CM | POA: Diagnosis not present

## 2020-03-03 LAB — SARS CORONAVIRUS 2 (TAT 6-24 HRS): SARS Coronavirus 2: NEGATIVE

## 2020-03-04 NOTE — Progress Notes (Signed)
Pt advised that surgery time on 03-07-20 is now 10:30 AM in lieu of 1:45 PM. Pt to arrive to admitting at 8:00 AM. Pt verbalized understanding.

## 2020-03-06 MED ORDER — BUPIVACAINE LIPOSOME 1.3 % IJ SUSP
20.0000 mL | INTRAMUSCULAR | Status: DC
  Filled 2020-03-06: qty 20

## 2020-03-07 ENCOUNTER — Ambulatory Visit (HOSPITAL_COMMUNITY): Payer: No Typology Code available for payment source | Admitting: Anesthesiology

## 2020-03-07 ENCOUNTER — Encounter (HOSPITAL_COMMUNITY)
Admission: RE | Disposition: A | Discharge status: Home / Self Care | Payer: Self-pay | Source: Ambulatory Visit | Attending: Orthopedic Surgery

## 2020-03-07 ENCOUNTER — Ambulatory Visit (HOSPITAL_COMMUNITY)
Admission: RE | Admit: 2020-03-07 | Discharge: 2020-03-08 | Disposition: A | Discharge status: Home / Self Care | Payer: No Typology Code available for payment source | Source: Ambulatory Visit | Attending: Orthopedic Surgery | Admitting: Orthopedic Surgery

## 2020-03-07 ENCOUNTER — Other Ambulatory Visit: Payer: Self-pay

## 2020-03-07 ENCOUNTER — Encounter (HOSPITAL_COMMUNITY): Payer: Self-pay | Admitting: Orthopedic Surgery

## 2020-03-07 ENCOUNTER — Ambulatory Visit (HOSPITAL_COMMUNITY): Payer: No Typology Code available for payment source | Admitting: Physician Assistant

## 2020-03-07 DIAGNOSIS — M1712 Unilateral primary osteoarthritis, left knee: Secondary | ICD-10-CM | POA: Diagnosis not present

## 2020-03-07 DIAGNOSIS — I10 Essential (primary) hypertension: Secondary | ICD-10-CM | POA: Diagnosis not present

## 2020-03-07 DIAGNOSIS — Z7722 Contact with and (suspected) exposure to environmental tobacco smoke (acute) (chronic): Secondary | ICD-10-CM | POA: Insufficient documentation

## 2020-03-07 DIAGNOSIS — M179 Osteoarthritis of knee, unspecified: Secondary | ICD-10-CM

## 2020-03-07 DIAGNOSIS — Z881 Allergy status to other antibiotic agents status: Secondary | ICD-10-CM | POA: Insufficient documentation

## 2020-03-07 DIAGNOSIS — G8929 Other chronic pain: Secondary | ICD-10-CM | POA: Diagnosis not present

## 2020-03-07 DIAGNOSIS — Z79899 Other long term (current) drug therapy: Secondary | ICD-10-CM | POA: Insufficient documentation

## 2020-03-07 DIAGNOSIS — Z7984 Long term (current) use of oral hypoglycemic drugs: Secondary | ICD-10-CM | POA: Insufficient documentation

## 2020-03-07 DIAGNOSIS — E119 Type 2 diabetes mellitus without complications: Secondary | ICD-10-CM | POA: Insufficient documentation

## 2020-03-07 DIAGNOSIS — K219 Gastro-esophageal reflux disease without esophagitis: Secondary | ICD-10-CM | POA: Diagnosis not present

## 2020-03-07 DIAGNOSIS — M1990 Unspecified osteoarthritis, unspecified site: Secondary | ICD-10-CM | POA: Insufficient documentation

## 2020-03-07 DIAGNOSIS — M1710 Unilateral primary osteoarthritis, unspecified knee: Secondary | ICD-10-CM

## 2020-03-07 DIAGNOSIS — Z7982 Long term (current) use of aspirin: Secondary | ICD-10-CM | POA: Diagnosis not present

## 2020-03-07 DIAGNOSIS — M1732 Unilateral post-traumatic osteoarthritis, left knee: Secondary | ICD-10-CM | POA: Diagnosis present

## 2020-03-07 DIAGNOSIS — F4310 Post-traumatic stress disorder, unspecified: Secondary | ICD-10-CM

## 2020-03-07 HISTORY — PX: TOTAL KNEE ARTHROPLASTY: SHX125

## 2020-03-07 LAB — GLUCOSE, CAPILLARY
Glucose-Capillary: 257 mg/dL — ABNORMAL HIGH (ref 70–99)
Glucose-Capillary: 91 mg/dL (ref 70–99)
Glucose-Capillary: 249 mg/dL — ABNORMAL HIGH (ref 70–99)
Glucose-Capillary: 100 mg/dL — ABNORMAL HIGH (ref 70–99)

## 2020-03-07 LAB — TYPE AND SCREEN
ABO/RH(D): O POS
Antibody Screen: NEGATIVE

## 2020-03-07 SURGERY — ARTHROPLASTY, KNEE, TOTAL
Anesthesia: Spinal | Site: Knee | Laterality: Left

## 2020-03-07 MED ORDER — STERILE WATER FOR IRRIGATION IR SOLN
Status: DC | PRN
  Administered 2020-03-07: 2000 mL

## 2020-03-07 MED ORDER — SODIUM CHLORIDE (PF) 0.9 % IJ SOLN
INTRAMUSCULAR | Status: AC
  Filled 2020-03-07: qty 50

## 2020-03-07 MED ORDER — PANTOPRAZOLE SODIUM 40 MG PO TBEC
40.0000 mg | DELAYED_RELEASE_TABLET | Freq: Two times a day (BID) | ORAL | Status: DC
  Administered 2020-03-08: 40 mg via ORAL
  Filled 2020-03-07: qty 1

## 2020-03-07 MED ORDER — INSULIN ASPART 100 UNIT/ML ~~LOC~~ SOLN
0.0000 [IU] | Freq: Three times a day (TID) | SUBCUTANEOUS | Status: DC
  Administered 2020-03-07 – 2020-03-08 (×2): 5 [IU] via SUBCUTANEOUS

## 2020-03-07 MED ORDER — ONDANSETRON HCL 4 MG/2ML IJ SOLN
INTRAMUSCULAR | Status: AC
  Filled 2020-03-07: qty 2

## 2020-03-07 MED ORDER — PROPOFOL 500 MG/50ML IV EMUL
INTRAVENOUS | Status: DC | PRN
  Administered 2020-03-07: 50 ug/kg/min via INTRAVENOUS

## 2020-03-07 MED ORDER — METOCLOPRAMIDE HCL 5 MG PO TABS: 5.0000 mg | ORAL_TABLET | Freq: Three times a day (TID) | ORAL | Status: DC | PRN

## 2020-03-07 MED ORDER — FENTANYL CITRATE (PF) 100 MCG/2ML IJ SOLN
INTRAMUSCULAR | Status: AC
  Filled 2020-03-07: qty 2

## 2020-03-07 MED ORDER — CLONIDINE HCL (ANALGESIA) 100 MCG/ML EP SOLN
EPIDURAL | Status: DC | PRN
  Administered 2020-03-07: 100 ug

## 2020-03-07 MED ORDER — ACETAMINOPHEN 10 MG/ML IV SOLN: 1000.0000 mg | Freq: Four times a day (QID) | INTRAVENOUS | Status: DC

## 2020-03-07 MED ORDER — ASPIRIN EC 325 MG PO TBEC
325.0000 mg | DELAYED_RELEASE_TABLET | Freq: Two times a day (BID) | ORAL | Status: DC
  Administered 2020-03-08: 325 mg via ORAL
  Filled 2020-03-07: qty 1

## 2020-03-07 MED ORDER — ONDANSETRON HCL 4 MG PO TABS: 4.0000 mg | ORAL_TABLET | Freq: Four times a day (QID) | ORAL | Status: DC | PRN

## 2020-03-07 MED ORDER — METHOCARBAMOL 500 MG PO TABS
500.0000 mg | ORAL_TABLET | Freq: Four times a day (QID) | ORAL | Status: DC | PRN
  Administered 2020-03-07: 500 mg via ORAL
  Filled 2020-03-07 (×2): qty 1

## 2020-03-07 MED ORDER — OXYCODONE HCL 5 MG PO TABS
5.0000 mg | ORAL_TABLET | ORAL | Status: DC | PRN
  Administered 2020-03-07 (×3): 10 mg via ORAL
  Filled 2020-03-07 (×3): qty 2

## 2020-03-07 MED ORDER — GABAPENTIN 300 MG PO CAPS
300.0000 mg | ORAL_CAPSULE | Freq: Three times a day (TID) | ORAL | Status: DC
  Administered 2020-03-07 – 2020-03-08 (×4): 300 mg via ORAL
  Filled 2020-03-07 (×4): qty 1

## 2020-03-07 MED ORDER — SODIUM CHLORIDE 0.9 % IV SOLN: INTRAVENOUS | Status: DC

## 2020-03-07 MED ORDER — SODIUM CHLORIDE 0.9 % IR SOLN
Status: DC | PRN
  Administered 2020-03-07: 1000 mL

## 2020-03-07 MED ORDER — DIPHENHYDRAMINE HCL 12.5 MG/5ML PO ELIX: 12.5000 mg | ORAL_SOLUTION | ORAL | Status: DC | PRN

## 2020-03-07 MED ORDER — FLEET ENEMA 7-19 GM/118ML RE ENEM: 1.0000 | ENEMA | Freq: Once | RECTAL | Status: DC | PRN

## 2020-03-07 MED ORDER — MIDAZOLAM HCL 2 MG/2ML IJ SOLN
INTRAMUSCULAR | Status: AC
  Filled 2020-03-07: qty 2

## 2020-03-07 MED ORDER — DEXAMETHASONE SODIUM PHOSPHATE 10 MG/ML IJ SOLN
INTRAMUSCULAR | Status: AC
  Filled 2020-03-07: qty 1

## 2020-03-07 MED ORDER — LACTATED RINGERS IV SOLN: INTRAVENOUS | Status: DC

## 2020-03-07 MED ORDER — DOCUSATE SODIUM 100 MG PO CAPS
100.0000 mg | ORAL_CAPSULE | Freq: Two times a day (BID) | ORAL | Status: DC
  Administered 2020-03-08: 100 mg via ORAL
  Filled 2020-03-07: qty 1

## 2020-03-07 MED ORDER — CARVEDILOL 12.5 MG PO TABS
12.5000 mg | ORAL_TABLET | Freq: Two times a day (BID) | ORAL | Status: DC
  Administered 2020-03-08: 12.5 mg via ORAL
  Filled 2020-03-07: qty 1

## 2020-03-07 MED ORDER — CEFAZOLIN SODIUM-DEXTROSE 2-4 GM/100ML-% IV SOLN
2.0000 g | Freq: Four times a day (QID) | INTRAVENOUS | Status: AC
  Administered 2020-03-07 (×2): 2 g via INTRAVENOUS
  Filled 2020-03-07 (×2): qty 100

## 2020-03-07 MED ORDER — BUPIVACAINE LIPOSOME 1.3 % IJ SUSP
INTRAMUSCULAR | Status: DC | PRN
  Administered 2020-03-07: 20 mL

## 2020-03-07 MED ORDER — ACETAMINOPHEN 500 MG PO TABS
1000.0000 mg | ORAL_TABLET | Freq: Four times a day (QID) | ORAL | Status: AC
  Administered 2020-03-07 – 2020-03-08 (×4): 1000 mg via ORAL
  Filled 2020-03-07 (×4): qty 2

## 2020-03-07 MED ORDER — FENTANYL CITRATE (PF) 100 MCG/2ML IJ SOLN
INTRAMUSCULAR | Status: DC | PRN
  Administered 2020-03-07 (×2): 50 ug via INTRAVENOUS

## 2020-03-07 MED ORDER — CHLORHEXIDINE GLUCONATE 4 % EX LIQD: 60.0000 mL | Freq: Once | CUTANEOUS | Status: DC

## 2020-03-07 MED ORDER — SODIUM CHLORIDE (PF) 0.9 % IJ SOLN
INTRAMUSCULAR | Status: AC
  Filled 2020-03-07: qty 10

## 2020-03-07 MED ORDER — ACETAMINOPHEN 10 MG/ML IV SOLN
1000.0000 mg | Freq: Once | INTRAVENOUS | Status: AC
  Administered 2020-03-07: 1000 mg via INTRAVENOUS
  Filled 2020-03-07: qty 100

## 2020-03-07 MED ORDER — ONDANSETRON HCL 4 MG/2ML IJ SOLN: 4.0000 mg | Freq: Four times a day (QID) | INTRAMUSCULAR | Status: DC | PRN

## 2020-03-07 MED ORDER — ZOLPIDEM TARTRATE 5 MG PO TABS: 5.0000 mg | ORAL_TABLET | Freq: Every evening | ORAL | Status: DC | PRN

## 2020-03-07 MED ORDER — BUPIVACAINE IN DEXTROSE 0.75-8.25 % IT SOLN
INTRATHECAL | Status: DC | PRN
  Administered 2020-03-07: 15 mg via INTRATHECAL

## 2020-03-07 MED ORDER — MORPHINE SULFATE (PF) 2 MG/ML IV SOLN
0.5000 mg | INTRAVENOUS | Status: DC | PRN
  Administered 2020-03-07 – 2020-03-08 (×3): 1 mg via INTRAVENOUS
  Filled 2020-03-07 (×3): qty 1

## 2020-03-07 MED ORDER — CEFAZOLIN SODIUM-DEXTROSE 2-4 GM/100ML-% IV SOLN
2.0000 g | INTRAVENOUS | Status: AC
  Administered 2020-03-07: 2 g via INTRAVENOUS
  Filled 2020-03-07: qty 100

## 2020-03-07 MED ORDER — MENTHOL 3 MG MT LOZG: 1.0000 | LOZENGE | OROMUCOSAL | Status: DC | PRN

## 2020-03-07 MED ORDER — DEXAMETHASONE SODIUM PHOSPHATE 10 MG/ML IJ SOLN
8.0000 mg | Freq: Once | INTRAMUSCULAR | Status: AC
  Administered 2020-03-07: 8 mg via INTRAVENOUS

## 2020-03-07 MED ORDER — MIDAZOLAM HCL 2 MG/2ML IJ SOLN
1.0000 mg | Freq: Once | INTRAMUSCULAR | Status: AC
  Administered 2020-03-07: 2 mg via INTRAVENOUS
  Filled 2020-03-07: qty 2

## 2020-03-07 MED ORDER — POVIDONE-IODINE 10 % EX SWAB: 2.0000 "application " | Freq: Once | CUTANEOUS | Status: DC

## 2020-03-07 MED ORDER — PHENOL 1.4 % MT LIQD: 1.0000 | OROMUCOSAL | Status: DC | PRN

## 2020-03-07 MED ORDER — METHOCARBAMOL 500 MG IVPB - SIMPLE MED
500.0000 mg | Freq: Four times a day (QID) | INTRAVENOUS | Status: DC | PRN
  Filled 2020-03-07: qty 50

## 2020-03-07 MED ORDER — BISACODYL 10 MG RE SUPP: 10.0000 mg | Freq: Every day | RECTAL | Status: DC | PRN

## 2020-03-07 MED ORDER — 0.9 % SODIUM CHLORIDE (POUR BTL) OPTIME
TOPICAL | Status: DC | PRN
  Administered 2020-03-07: 1000 mL

## 2020-03-07 MED ORDER — MIDAZOLAM HCL 5 MG/5ML IJ SOLN
INTRAMUSCULAR | Status: DC | PRN
  Administered 2020-03-07 (×2): 1 mg via INTRAVENOUS

## 2020-03-07 MED ORDER — TRAMADOL HCL 50 MG PO TABS
50.0000 mg | ORAL_TABLET | Freq: Four times a day (QID) | ORAL | Status: DC | PRN
  Administered 2020-03-08: 100 mg via ORAL
  Filled 2020-03-07: qty 2

## 2020-03-07 MED ORDER — ROPIVACAINE HCL 5 MG/ML IJ SOLN
INTRAMUSCULAR | Status: DC | PRN
  Administered 2020-03-07 (×2): 5 mL via PERINEURAL

## 2020-03-07 MED ORDER — SODIUM CHLORIDE (PF) 0.9 % IJ SOLN
INTRAMUSCULAR | Status: DC | PRN
  Administered 2020-03-07: 60 mL

## 2020-03-07 MED ORDER — LIDOCAINE HCL (CARDIAC) PF 100 MG/5ML IV SOSY
PREFILLED_SYRINGE | INTRAVENOUS | Status: DC | PRN
  Administered 2020-03-07: 40 mg via INTRAVENOUS

## 2020-03-07 MED ORDER — FENTANYL CITRATE (PF) 100 MCG/2ML IJ SOLN
50.0000 ug | Freq: Once | INTRAMUSCULAR | Status: AC
  Administered 2020-03-07: 100 ug via INTRAVENOUS
  Filled 2020-03-07: qty 2

## 2020-03-07 MED ORDER — POLYETHYLENE GLYCOL 3350 17 G PO PACK: 17.0000 g | PACK | Freq: Every day | ORAL | Status: DC | PRN

## 2020-03-07 MED ORDER — METOCLOPRAMIDE HCL 5 MG/ML IJ SOLN: 5.0000 mg | Freq: Three times a day (TID) | INTRAMUSCULAR | Status: DC | PRN

## 2020-03-07 MED ORDER — TRANEXAMIC ACID-NACL 1000-0.7 MG/100ML-% IV SOLN
1000.0000 mg | INTRAVENOUS | Status: AC
  Administered 2020-03-07: 1000 mg via INTRAVENOUS
  Filled 2020-03-07: qty 100

## 2020-03-07 MED ORDER — ROPIVACAINE HCL 7.5 MG/ML IJ SOLN
INTRAMUSCULAR | Status: DC | PRN
  Administered 2020-03-07 (×4): 5 mL via PERINEURAL

## 2020-03-07 MED ORDER — ONDANSETRON HCL 4 MG/2ML IJ SOLN
INTRAMUSCULAR | Status: DC | PRN
  Administered 2020-03-07: 4 mg via INTRAVENOUS

## 2020-03-07 SURGICAL SUPPLY — 60 items
ATTUNE MED DOME PAT 41 KNEE (Knees) ×1 IMPLANT
ATTUNE PS FEM LT SZ 8 CEM KNEE (Femur) ×1 IMPLANT
ATTUNE PSRP INSR SZ8 8 KNEE (Insert) ×1 IMPLANT
BAG SPEC THK2 15X12 ZIP CLS (MISCELLANEOUS) ×1
BAG ZIPLOCK 12X15 (MISCELLANEOUS) ×2 IMPLANT
BASE TIBIAL ROT PLAT SZ 8 KNEE (Knees) IMPLANT
BLADE SAG 18X100X1.27 (BLADE) ×2 IMPLANT
BLADE SAW SGTL 11.0X1.19X90.0M (BLADE) ×2 IMPLANT
BLADE SURG SZ10 CARB STEEL (BLADE) ×4 IMPLANT
BNDG ELASTIC 6X5.8 VLCR STR LF (GAUZE/BANDAGES/DRESSINGS) ×2 IMPLANT
BOWL SMART MIX CTS (DISPOSABLE) ×2 IMPLANT
BSPLAT TIB 8 CMNT ROT PLAT STR (Knees) ×1 IMPLANT
CEMENT HV SMART SET (Cement) ×4 IMPLANT
COVER SURGICAL LIGHT HANDLE (MISCELLANEOUS) ×2 IMPLANT
COVER WAND RF STERILE (DRAPES) ×1 IMPLANT
CUFF TOURN SGL QUICK 34 (TOURNIQUET CUFF) ×2
CUFF TRNQT CYL 34X4.125X (TOURNIQUET CUFF) ×1 IMPLANT
DECANTER SPIKE VIAL GLASS SM (MISCELLANEOUS) ×3 IMPLANT
DRAPE U-SHAPE 47X51 STRL (DRAPES) ×2 IMPLANT
DRSG AQUACEL AG ADV 3.5X10 (GAUZE/BANDAGES/DRESSINGS) ×2 IMPLANT
DURAPREP 26ML APPLICATOR (WOUND CARE) ×2 IMPLANT
ELECT REM PT RETURN 15FT ADLT (MISCELLANEOUS) ×2 IMPLANT
EVACUATOR 1/8 PVC DRAIN (DRAIN) ×1 IMPLANT
GAUZE SPONGE 2X2 8PLY STRL LF (GAUZE/BANDAGES/DRESSINGS) ×1 IMPLANT
GLOVE BIO SURGEON STRL SZ 6 (GLOVE) ×1 IMPLANT
GLOVE BIO SURGEON STRL SZ7 (GLOVE) ×1 IMPLANT
GLOVE BIO SURGEON STRL SZ8 (GLOVE) ×3 IMPLANT
GLOVE BIOGEL PI IND STRL 6.5 (GLOVE) IMPLANT
GLOVE BIOGEL PI IND STRL 7.0 (GLOVE) ×1 IMPLANT
GLOVE BIOGEL PI IND STRL 8 (GLOVE) ×1 IMPLANT
GLOVE BIOGEL PI INDICATOR 6.5 (GLOVE) ×1
GLOVE BIOGEL PI INDICATOR 7.0 (GLOVE)
GLOVE BIOGEL PI INDICATOR 8 (GLOVE) ×1
GOWN STRL REUS W/TWL LRG LVL3 (GOWN DISPOSABLE) ×4 IMPLANT
HANDPIECE INTERPULSE COAX TIP (DISPOSABLE) ×2
HOLDER FOLEY CATH W/STRAP (MISCELLANEOUS) ×1 IMPLANT
IMMOBILIZER KNEE 20 (SOFTGOODS) ×2
IMMOBILIZER KNEE 20 THIGH 36 (SOFTGOODS) ×1 IMPLANT
KIT TURNOVER KIT A (KITS) IMPLANT
MANIFOLD NEPTUNE II (INSTRUMENTS) ×2 IMPLANT
NS IRRIG 1000ML POUR BTL (IV SOLUTION) ×2 IMPLANT
PACK TOTAL KNEE CUSTOM (KITS) ×2 IMPLANT
PADDING CAST COTTON 6X4 STRL (CAST SUPPLIES) ×3 IMPLANT
PENCIL SMOKE EVACUATOR (MISCELLANEOUS) ×1 IMPLANT
PIN DRILL FIX HALF THREAD (BIT) ×1 IMPLANT
PIN STEINMAN FIXATION KNEE (PIN) ×1 IMPLANT
PROTECTOR NERVE ULNAR (MISCELLANEOUS) ×2 IMPLANT
SET HNDPC FAN SPRY TIP SCT (DISPOSABLE) ×1 IMPLANT
SPONGE GAUZE 2X2 STER 10/PKG (GAUZE/BANDAGES/DRESSINGS) ×1
STRIP CLOSURE SKIN 1/2X4 (GAUZE/BANDAGES/DRESSINGS) ×4 IMPLANT
SUT MNCRL AB 4-0 PS2 18 (SUTURE) ×2 IMPLANT
SUT STRATAFIX 0 PDS 27 VIOLET (SUTURE) ×2
SUT VIC AB 2-0 CT1 27 (SUTURE) ×6
SUT VIC AB 2-0 CT1 TAPERPNT 27 (SUTURE) ×3 IMPLANT
SUTURE STRATFX 0 PDS 27 VIOLET (SUTURE) ×1 IMPLANT
TIBIAL BASE ROT PLAT SZ 8 KNEE (Knees) ×2 IMPLANT
TRAY FOLEY MTR SLVR 16FR STAT (SET/KITS/TRAYS/PACK) ×2 IMPLANT
WATER STERILE IRR 1000ML POUR (IV SOLUTION) ×4 IMPLANT
WRAP KNEE MAXI GEL POST OP (GAUZE/BANDAGES/DRESSINGS) ×2 IMPLANT
YANKAUER SUCT BULB TIP 10FT TU (MISCELLANEOUS) ×2 IMPLANT
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
## 2020-03-07 NOTE — Interval H&P Note (Signed)
History and Physical Interval Note:  03/07/2020 8:27 AM  Daniel [name]  has presented today for surgery, with the diagnosis of left knee osteoarthritis.  The various methods of treatment have been discussed with the patient and family. After consideration of risks, benefits and other options for treatment, the patient has consented to  Procedure(s) with comments: TOTAL KNEE ARTHROPLASTY (Left) - 21min as a surgical intervention.  [name] history has been reviewed, patient examined, no change in status, stable for surgery.  I have reviewed the patient's chart and labs.  Questions were answered to the patient's satisfaction.     [name]

## 2020-03-07 NOTE — Discharge Instructions (Addendum)
 Daniel Aluisio, MD Total Joint Specialist [facility] [address] [place],  [address] [phone]  TOTAL KNEE REPLACEMENT POSTOPERATIVE DIRECTIONS    Knee Rehabilitation, Guidelines Following Surgery  Results after knee surgery are often greatly improved when you follow the exercise, range of motion and muscle strengthening exercises prescribed by your doctor. Safety measures are also important to protect the knee from further injury. If any of these exercises cause you to have increased pain or swelling in your knee joint, decrease the amount until you are comfortable again and slowly increase them. If you have problems or questions, call your caregiver or physical therapist for advice.   BLOOD CLOT PREVENTION . Take a 325 mg Aspirin two times a day for three weeks following surgery. Then resume one 81 mg Aspirin once a day. . You may resume your vitamins/supplements upon discharge from the hospital. . Do not take any NSAIDs (Advil, Aleve, Ibuprofen, Meloxicam, etc.) until you have discontinued the 325 mg Aspirin.  HOME CARE INSTRUCTIONS  . Remove items at home which could result in a fall. This includes throw rugs or furniture in walking pathways.  . ICE to the affected knee as much as tolerated. Icing helps control swelling. If the swelling is well controlled you will be more comfortable and rehab easier. Continue to use ice on the knee for pain and swelling from surgery. You may notice swelling that will progress down to the foot and ankle. This is normal after surgery. Elevate the leg when you are not up walking on it.    . Continue to use the breathing machine which will help keep your temperature down. It is common for your temperature to cycle up and down following surgery, especially at night when you are not up moving around and exerting yourself. The breathing machine keeps your lungs expanded and your temperature down. . Do not place pillow  under the operative knee, focus on keeping the knee straight while resting  DIET You may resume your previous home diet once you are discharged from the hospital.  DRESSING / WOUND CARE / SHOWERING . Keep your bulky bandage on for 2 days. On the third post-operative day you may remove the Ace bandage and gauze. There is a waterproof adhesive bandage on your skin which will stay in place until your first follow-up appointment. Once you remove this you will not need to place another bandage . You may begin showering 3 days following surgery, but do not submerge the incision under water.  ACTIVITY For the first 5 days, the key is rest and control of pain and swelling . Do your home exercises twice a day starting on post-operative day 3. On the days you go to physical therapy, just do the home exercises once that day. . You should rest, ice and elevate the leg for 50 minutes out of every hour. Get up and walk/stretch for 10 minutes per hour. After 5 days you can increase your activity slowly as tolerated. . Walk with your walker as instructed. Use the walker until you are comfortable transitioning to a cane. Walk with the cane in the opposite hand of the operative leg. You may discontinue the cane once you are comfortable and walking steadily. . Avoid periods of inactivity such as sitting longer than an hour when not asleep. This helps prevent blood clots.  . You may discontinue the knee immobilizer once you are able to perform a straight leg raise while lying down. .   You may resume a sexual relationship in one month or when given the OK by your doctor.  . You may return to work once you are cleared by your doctor.  . Do not drive a car for 6 weeks or until released by your surgeon.  . Do not drive while taking narcotics.  TED HOSE STOCKINGS Wear the elastic stockings on both legs for three weeks following surgery during the day. You may remove them at night for sleeping.  WEIGHT BEARING Weight  bearing as tolerated with assist device (walker, cane, etc) as directed, use it as long as suggested by your surgeon or therapist, typically at least 4-6 weeks.  POSTOPERATIVE CONSTIPATION PROTOCOL Constipation - defined medically as fewer than three stools per week and severe constipation as less than one stool per week.  One of the most common issues patients have following surgery is constipation.  Even if you have a regular bowel pattern at home, your normal regimen is likely to be disrupted due to multiple reasons following surgery.  Combination of anesthesia, postoperative narcotics, change in appetite and fluid intake all can affect your bowels.  In order to avoid complications following surgery, here are some recommendations in order to help you during your recovery period.  . Colace (docusate) - Pick up an over-the-counter form of Colace or another stool softener and take twice a day as long as you are requiring postoperative pain medications.  Take with a full glass of water daily.  If you experience loose stools or diarrhea, hold the colace until you stool forms back up. If your symptoms do not get better within 1 week or if they get worse, check with your doctor. . Dulcolax (bisacodyl) - Pick up over-the-counter and take as directed by the product packaging as needed to assist with the movement of your bowels.  Take with a full glass of water.  Use this product as needed if not relieved by Colace only.  . MiraLax (polyethylene glycol) - Pick up over-the-counter to have on hand. MiraLax is a solution that will increase the amount of water in your bowels to assist with bowel movements.  Take as directed and can mix with a glass of water, juice, soda, coffee, or tea. Take if you go more than two days without a movement. Do not use MiraLax more than once per day. Call your doctor if you are still constipated or irregular after using this medication for 7 days in a row.  If you continue to have  problems with postoperative constipation, please contact the office for further assistance and recommendations.  If you experience "the worst abdominal pain ever" or develop nausea or vomiting, please contact the office immediatly for further recommendations for treatment.  ITCHING If you experience itching with your medications, try taking only a single pain pill, or even half a pain pill at a time.  You can also use Benadryl over the counter for itching or also to help with sleep.   MEDICATIONS See your medication summary on the "After Visit Summary" that the nursing staff will review with you prior to discharge.  You may have some home medications which will be placed on hold until you complete the course of blood thinner medication.  It is important for you to complete the blood thinner medication as prescribed by your surgeon.  Continue your approved medications as instructed at time of discharge.  PRECAUTIONS . If you experience chest pain or shortness of breath - call 911 immediately for   transfer to the hospital emergency department.  . If you develop a fever greater that 101 F, purulent drainage from wound, increased redness or drainage from wound, foul odor from the wound/dressing, or calf pain - CONTACT YOUR SURGEON.                                                   FOLLOW-UP APPOINTMENTS Make sure you keep all of your appointments after your operation with your surgeon and caregivers. You should call the office at the above phone number and make an appointment for approximately two weeks after the date of your surgery or on the date instructed by your surgeon outlined in the "After Visit Summary".  RANGE OF MOTION AND STRENGTHENING EXERCISES  Rehabilitation of the knee is important following a knee injury or an operation. After just a few days of immobilization, the muscles of the thigh which control the knee become weakened and shrink (atrophy). Knee exercises are designed to build up the  tone and strength of the thigh muscles and to improve knee motion. Often times heat used for twenty to thirty minutes before working out will loosen up your tissues and help with improving the range of motion but do not use heat for the first two weeks following surgery. These exercises can be done on a training (exercise) mat, on the floor, on a table or on a bed. Use what ever works the best and is most comfortable for you Knee exercises include:  . Leg Lifts - While your knee is still immobilized in a splint or cast, you can do straight leg raises. Lift the leg to 60 degrees, hold for 3 sec, and slowly lower the leg. Repeat 10-20 times 2-3 times daily. Perform this exercise against resistance later as your knee gets better.  . Quad and Hamstring Sets - Tighten up the muscle on the front of the thigh (Quad) and hold for 5-10 sec. Repeat this 10-20 times hourly. Hamstring sets are done by pushing the foot backward against an object and holding for 5-10 sec. Repeat as with quad sets.   Leg Slides: Lying on your back, slowly slide your foot toward your buttocks, bending your knee up off the floor (only go as far as is comfortable). Then slowly slide your foot back down until your leg is flat on the floor again.  Angel Wings: Lying on your back spread your legs to the side as far apart as you can without causing discomfort.  A rehabilitation program following serious knee injuries can speed recovery and prevent re-injury in the future due to weakened muscles. Contact your doctor or a physical therapist for more information on knee rehabilitation.   IF YOU ARE TRANSFERRED TO A SKILLED REHAB FACILITY If the patient is transferred to a skilled rehab facility following release from the hospital, a list of the current medications will be sent to the facility for the patient to continue.  When discharged from the skilled rehab facility, please have the facility set up the patient's Home Health Physical Therapy  prior to being released. Also, the skilled facility will be responsible for providing the patient with their medications at time of release from the facility to include their pain medication, the muscle relaxants, and their blood thinner medication. If the patient is still at the rehab facility at time of the   two week follow up appointment, the skilled rehab facility will also need to assist the patient in arranging follow up appointment in our office and any transportation needs.  MAKE SURE YOU:  . Understand these instructions.  . Get help right away if you are not doing well or get worse.    Pick up stool softner and laxative for home use following surgery while on pain medications. Do not submerge incision under water. Please use good hand washing techniques while changing dressing each day. May shower starting three days after surgery. Please use a clean towel to pat the incision dry following showers. Continue to use ice for pain and swelling after surgery. Do not use any lotions or creams on the incision until instructed by your surgeon.

## 2020-03-07 NOTE — Op Note (Signed)
OPERATIVE REPORT-TOTAL KNEE ARTHROPLASTY   Pre-operative diagnosis- Osteoarthritis  Left knee(s)  Post-operative diagnosis- Osteoarthritis Left knee(s)  Procedure-  Left  Total Knee Arthroplasty  Surgeon- Dione Plover. Xinyi Batton, MD  Assistant- Griffith Citron, PA-C   Anesthesia-  Adductor canal block and spinal  EBL- 25 ml   Drains Hemovac  Tourniquet time-  Total Tourniquet Time Documented: Thigh (Left) - 48 minutes Total: Thigh (Left) - 48 minutes     Complications- None  Condition-PACU - hemodynamically stable.   Brief Clinical Note   Daniel [name] is a 54 y.o. year old male with end stage OA of his left knee with progressively worsening pain and dysfunction. He has constant pain, with activity and at rest and significant functional deficits with difficulties even with ADLs. He has had extensive non-op management including analgesics, injections of cortisone and viscosupplements, and home exercise program, but remains in significant pain with significant dysfunction. Radiographs show bone on bone arthritis medial and patellofemoral. He presents now for left Total Knee Arthroplasty.    Procedure in detail---   The patient is brought into the operating room and positioned supine on the operating table. After successful administration of  Adductor canal block and spinal,   a tourniquet is placed high on the  Left thigh(s) and the lower extremity is prepped and draped in the usual sterile fashion. Time out is performed by the operating team and then the  Left lower extremity is wrapped in Esmarch, knee flexed and the tourniquet inflated to 300 mmHg.       A midline incision is made with a ten blade through the subcutaneous tissue to the level of the extensor mechanism. A fresh blade is used to make a medial parapatellar arthrotomy. Soft tissue over the proximal medial tibia is subperiosteally elevated to the joint line with a knife and into the semimembranosus bursa with a Cobb  elevator. Soft tissue over the proximal lateral tibia is elevated with attention being paid to avoiding the patellar tendon on the tibial tubercle. The patella is everted, knee flexed 90 degrees and the ACL and PCL are removed. Findings are bone on bone medial and patellofemoral with large global osteophytes.        The drill is used to create a starting hole in the distal femur and the canal is thoroughly irrigated with sterile saline to remove the fatty contents. The 5 degree Left  valgus alignment guide is placed into the femoral canal and the distal femoral cutting block is pinned to remove 9 mm off the distal femur. Resection is made with an oscillating saw.      The tibia is subluxed forward and the menisci are removed. The extramedullary alignment guide is placed referencing proximally at the medial aspect of the tibial tubercle and distally along the second metatarsal axis and tibial crest. The block is pinned to remove 91mm off the more deficient medial  side. Resection is made with an oscillating saw. Size 8is the most appropriate size for the tibia and the proximal tibia is prepared with the modular drill and keel punch for that size.      The femoral sizing guide is placed and size 8 is most appropriate. Rotation is marked off the epicondylar axis and confirmed by creating a rectangular flexion gap at 90 degrees. The size 8 cutting block is pinned in this rotation and the anterior, posterior and chamfer cuts are made with the oscillating saw. The intercondylar block is then placed and that cut is  made.      Trial size 8 tibial component, trial size 8 posterior stabilized femur and a 8  mm posterior stabilized rotating platform insert trial is placed. Full extension is achieved with excellent varus/valgus and anterior/posterior balance throughout full range of motion. The patella is everted and thickness measured to be 27  mm. Free hand resection is taken to 15 mm, a 41 template is placed, lug holes  are drilled, trial patella is placed, and it tracks normally. Osteophytes are removed off the posterior femur with the trial in place. All trials are removed and the cut bone surfaces prepared with pulsatile lavage. Cement is mixed and once ready for implantation, the size 8 tibial implant, size  8 posterior stabilized femoral component, and the size 41 patella are cemented in place and the patella is held with the clamp. The trial insert is placed and the knee held in full extension. The Exparel (20 ml mixed with 60 ml saline) is injected into the extensor mechanism, posterior capsule, medial and lateral gutters and subcutaneous tissues.  All extruded cement is removed and once the cement is hard the permanent 8 mm posterior stabilized rotating platform insert is placed into the tibial tray.      The wound is copiously irrigated with saline solution and the extensor mechanism closed over a hemovac drain with #1 V-loc suture. The tourniquet is released for a total tourniquet time of 48  minutes. Flexion against gravity is 140 degrees and the patella tracks normally. Subcutaneous tissue is closed with 2.0 vicryl and subcuticular with running 4.0 Monocryl. The incision is cleaned and dried and steri-strips and a bulky sterile dressing are applied. The limb is placed into a knee immobilizer and the patient is awakened and transported to recovery in stable condition.      Please note that a surgical assistant was a medical necessity for this procedure in order to perform it in a safe and expeditious manner. Surgical assistant was necessary to retract the ligaments and vital neurovascular structures to prevent injury to them and also necessary for proper positioning of the limb to allow for anatomic placement of the prosthesis.   Dione Plover Ikeya Brockel, MD    03/07/2020, 12:03 PM

## 2020-03-07 NOTE — Transfer of Care (Signed)
Immediate Anesthesia Transfer of Care Note  Patient: Daniel [name]  Procedure(s) Performed: TOTAL KNEE ARTHROPLASTY (Left Knee)  Patient Location: PACU  Anesthesia Type:Spinal  Level of Consciousness: awake, alert , oriented and patient cooperative  Airway & Oxygen Therapy: Patient Spontanous Breathing and Patient connected to face mask oxygen  Post-op Assessment: Report given to RN and Post -op Vital signs reviewed and stable  Post vital signs: Reviewed and stable  Last Vitals:  Vitals Value Taken Time  BP 129/90 03/07/20 1224  Temp    Pulse 70 03/07/20 1225  Resp 15 03/07/20 1225  SpO2 100 % 03/07/20 1225  Vitals shown include unvalidated device data.  Last Pain:  Vitals:   03/07/20 0842  TempSrc: Oral  PainSc:          Complications: No apparent anesthesia complications

## 2020-03-07 NOTE — Anesthesia Postprocedure Evaluation (Signed)
Anesthesia Post Note  Patient: Daniel [name]  Procedure(s) Performed: TOTAL KNEE ARTHROPLASTY (Left Knee)     Patient location during evaluation: PACU Anesthesia Type: Spinal Level of consciousness: awake Pain management: pain level controlled Vital Signs Assessment: post-procedure vital signs reviewed and stable Respiratory status: spontaneous breathing Cardiovascular status: stable Postop Assessment: no headache, no backache, spinal receding, patient able to bend at knees and no apparent nausea or vomiting Anesthetic complications: no    Last Vitals:  Vitals:   03/07/20 1245 03/07/20 1307  BP: (!) 123/94 (!) 127/93  Pulse: (!) 59 (!) 56  Resp: 12 16  Temp: (!) 36.3 C   SpO2: 100% 100%    Last Pain:  Vitals:   03/07/20 1307  TempSrc:   PainSc: 0-No pain   Pain Goal:                   Huston Foley

## 2020-03-07 NOTE — Anesthesia Procedure Notes (Signed)
Anesthesia Regional Block: Adductor canal block   Pre-Anesthetic Checklist: ,, timeout performed, Correct Patient, Correct Site, Correct Laterality, Correct Procedure, Correct Position, site marked, Risks and benefits discussed,  Surgical consent,  Pre-op evaluation,  At surgeon's request and post-op pain management  Laterality: Lower and Left  Prep: chloraprep       Needles:  Injection technique: Single-shot  Needle Type: Echogenic Stimulator Needle     Needle Length: 10cm  Needle Gauge: 21   Needle insertion depth: 2 cm   Additional Needles:   Procedures:,,,, ultrasound used (permanent image in chart),,,,  Narrative:  Start time: 03/07/2020 10:20 AM End time: 03/07/2020 10:28 AM Injection made incrementally with aspirations every 5 mL.  Performed by: Personally  Anesthesiologist: Lyn Hollingshead, MD

## 2020-03-07 NOTE — Plan of Care (Signed)
Plan of care for post op day 0 discussed with patient.    Family arriving later today, not at bedside at this time.    Will continue to monitor.      SWhittemore, Therapist, sports

## 2020-03-07 NOTE — Progress Notes (Signed)
Assisted Dr. Hatchett with left, ultrasound guided, adductor canal block. Side rails up, monitors on throughout procedure. See vital signs in flow sheet. Tolerated Procedure well. 

## 2020-03-07 NOTE — Plan of Care (Signed)
  Problem: Clinical Measurements: Goal: Postoperative complications will be avoided or minimized Outcome: Progressing   Problem: Pain Management: Goal: Pain level will decrease with appropriate interventions Outcome: Progressing   Problem: Nutrition: Goal: Adequate nutrition will be maintained Outcome: Progressing

## 2020-03-07 NOTE — Anesthesia Preprocedure Evaluation (Signed)
Anesthesia Evaluation  Patient identified by MRN, date of birth, ID band Patient awake    Reviewed: Allergy & Precautions, NPO status , Patient's Chart, lab work & pertinent test results  Airway Mallampati: I       Dental no notable dental hx. (+) Teeth Intact   Pulmonary asthma ,    Pulmonary exam normal breath sounds clear to auscultation       Cardiovascular hypertension, Pt. on medications and Pt. on home beta blockers Normal cardiovascular exam Rhythm:Regular Rate:Normal     Neuro/Psych PSYCHIATRIC DISORDERS Anxiety Depression    GI/Hepatic Neg liver ROS, GERD  Medicated and Controlled,  Endo/Other  diabetes, Type 2, Oral Hypoglycemic Agents  Renal/GU negative Renal ROS     Musculoskeletal  (+) Arthritis , Osteoarthritis,    Abdominal Normal abdominal exam  (+)   Peds  Hematology negative hematology ROS (+)   Anesthesia Other Findings   Reproductive/Obstetrics                             Anesthesia Physical Anesthesia Plan  ASA: II  Anesthesia Plan: Spinal   Post-op Pain Management:    Induction:   PONV Risk Score and Plan: 1 and Ondansetron and Midazolam  Airway Management Planned: Nasal Cannula, Natural Airway and Simple Face Mask  Additional Equipment: None  Intra-op Plan:   Post-operative Plan:   Informed Consent: I have reviewed the patients History and Physical, chart, labs and discussed the procedure including the risks, benefits and alternatives for the proposed anesthesia with the patient or authorized representative who has indicated his/her understanding and acceptance.       Plan Discussed with: CRNA  Anesthesia Plan Comments:         Anesthesia Quick Evaluation

## 2020-03-07 NOTE — Anesthesia Procedure Notes (Signed)
Spinal  Patient location during procedure: OR Start time: 03/07/2020 10:41 AM End time: 03/07/2020 10:46 AM Staffing Performed: resident/CRNA  Resident/CRNA: Garrel Ridgel, CRNA Preanesthetic Checklist Completed: patient identified, IV checked, site marked, risks and benefits discussed, surgical consent, monitors and equipment checked, pre-op evaluation and timeout performed Spinal Block Patient position: sitting Prep: DuraPrep Patient monitoring: heart rate, cardiac monitor, continuous pulse ox and blood pressure Approach: midline Location: L3-4 Injection technique: single-shot Needle Needle type: Sprotte and Pencan  Needle gauge: 24 G Needle length: 9 cm Needle insertion depth: 5 cm Assessment Sensory level: T4

## 2020-03-08 ENCOUNTER — Encounter: Payer: Self-pay | Admitting: *Deleted

## 2020-03-08 DIAGNOSIS — M1712 Unilateral primary osteoarthritis, left knee: Secondary | ICD-10-CM | POA: Diagnosis not present

## 2020-03-08 LAB — BASIC METABOLIC PANEL
Anion gap: 9 (ref 5–15)
BUN: 13 mg/dL (ref 6–20)
CO2: 26 mmol/L (ref 22–32)
Calcium: 8.7 mg/dL — ABNORMAL LOW (ref 8.9–10.3)
Chloride: 102 mmol/L (ref 98–111)
Creatinine, Ser: 1.22 mg/dL (ref 0.61–1.24)
GFR calc Af Amer: >60 mL/min (ref >60)
GFR calc non Af Amer: >60 mL/min (ref >60)
Glucose, Bld: 167 mg/dL — ABNORMAL HIGH (ref 70–99)
Potassium: 3.8 mmol/L (ref 3.5–5.1)
Sodium: 137 mmol/L (ref 135–145)

## 2020-03-08 LAB — CBC
HCT: 37.8 % — ABNORMAL LOW (ref 39.0–52.0)
Hemoglobin: 13.4 g/dL (ref 13.0–17.0)
MCH: 31.8 pg (ref 26.0–34.0)
MCHC: 35.4 g/dL (ref 30.0–36.0)
MCV: 89.6 fL (ref 80.0–100.0)
Platelets: 137 10*3/uL — ABNORMAL LOW (ref 150–400)
RBC: 4.22 MIL/uL (ref 4.22–5.81)
RDW: 13.2 % (ref 11.5–15.5)
WBC: 13.1 10*3/uL — ABNORMAL HIGH (ref 4.0–10.5)
nRBC: 0 % (ref 0.0–0.2)

## 2020-03-08 LAB — GLUCOSE, CAPILLARY
Glucose-Capillary: 109 mg/dL — ABNORMAL HIGH (ref 70–99)
Glucose-Capillary: 218 mg/dL — ABNORMAL HIGH (ref 70–99)

## 2020-03-08 MED ORDER — TRAMADOL HCL 50 MG PO TABS: 50.0000 mg | ORAL_TABLET | Freq: Four times a day (QID) | ORAL | 0 refills | Status: DC | PRN

## 2020-03-08 MED ORDER — HYDROMORPHONE HCL 1 MG/ML IJ SOLN
0.5000 mg | INTRAMUSCULAR | Status: DC | PRN
  Administered 2020-03-08 (×2): 1 mg via INTRAVENOUS
  Filled 2020-03-08 (×2): qty 1

## 2020-03-08 MED ORDER — CYCLOBENZAPRINE HCL 10 MG PO TABS
10.0000 mg | ORAL_TABLET | Freq: Three times a day (TID) | ORAL | Status: DC | PRN
  Administered 2020-03-08: 10 mg via ORAL
  Filled 2020-03-08: qty 1

## 2020-03-08 MED ORDER — ASPIRIN 325 MG PO TBEC: 325.0000 mg | DELAYED_RELEASE_TABLET | Freq: Two times a day (BID) | ORAL | 0 refills | Status: AC

## 2020-03-08 MED ORDER — GABAPENTIN 300 MG PO CAPS: 300.0000 mg | ORAL_CAPSULE | Freq: Three times a day (TID) | ORAL | 0 refills | Status: DC

## 2020-03-08 MED ORDER — OXYCODONE HCL 5 MG PO TABS
5.0000 mg | ORAL_TABLET | ORAL | Status: DC | PRN
  Administered 2020-03-08 (×3): 15 mg via ORAL
  Filled 2020-03-08 (×3): qty 3

## 2020-03-08 MED ORDER — OXYCODONE HCL 5 MG PO TABS: 5.0000 mg | ORAL_TABLET | Freq: Four times a day (QID) | ORAL | 0 refills | Status: DC | PRN

## 2020-03-08 MED ORDER — CYCLOBENZAPRINE HCL 10 MG PO TABS: 10.0000 mg | ORAL_TABLET | Freq: Three times a day (TID) | ORAL | 0 refills | Status: DC | PRN

## 2020-03-08 NOTE — Progress Notes (Signed)
Physical Therapy Treatment Patient Details Name: Daniel [name] MRN: [id number] DOB: Apr 07, 1966 Today's Date: 03/08/2020    History of Present Illness s/p L TKA. PMH: cspine surgery, PTSD, chronic pain, HTN, DM, multiple rib and comp fxs d/t traumatic injury    PT Comments    Pt progressing although continues to have issues with pain. Will see again later today. Pt remains motivated and wants to go home however he does have a flight of stairs to get to his bedroom.   Follow Up Recommendations  Follow surgeon's recommendation for DC plan and follow-up therapies     Equipment Recommendations  None recommended by PT    Recommendations for Other Services       Precautions / Restrictions Precautions Precautions: Fall;Knee Required Braces or Orthoses: Knee Immobilizer - Left Knee Immobilizer - Left: Discontinue once straight leg raise with < 10 degree lag Restrictions Weight Bearing Restrictions: No Other Position/Activity Restrictions: WBAT    Mobility  Bed Mobility Overal bed mobility: Needs Assistance Bed Mobility: Supine to Sit     Supine to sit: Min assist     General bed mobility comments: assist with LLE, incr time   Transfers Overall transfer level: Needs assistance Equipment used: Rolling walker (2 wheeled) Transfers: Sit to/from Stand Sit to Stand: Min guard         General transfer comment: cues for hand placement, min/guard for safety   Ambulation/Gait Ambulation/Gait assistance: Min guard Gait Distance (Feet): 75 Feet Assistive device: Rolling walker (2 wheeled) Gait Pattern/deviations: Step-to pattern;Decreased stance time - left;Antalgic     General Gait Details: cues for sequence and RW position   Stairs             Wheelchair Mobility    Modified Rankin (Stroke Patients Only)       Balance                                            Cognition Arousal/Alertness: Awake/alert Behavior During Therapy: WFL for  tasks assessed/performed Overall Cognitive Status: Within Functional Limits for tasks assessed                                        Exercises      General Comments        Pertinent Vitals/Pain Pain Assessment: 0-10 Pain Score: 9  Pain Location: L knee Pain Descriptors / Indicators: Grimacing;Sore;Guarding Pain Intervention(s): Limited activity within patient's tolerance;Monitored during session;Repositioned;RN gave pain meds during session;Ice applied;Premedicated before session    Home Living                      Prior Function            PT Goals (current goals can now be found in the care plan section) Acute Rehab PT Goals Patient Stated Goal: get back to walking 4-5 miles a day PT Goal Formulation: With patient Time For Goal Achievement: 03/14/20 Potential to Achieve Goals: Good Progress towards PT goals: Progressing toward goals    Frequency    7X/week      PT Plan Current plan remains appropriate    Co-evaluation              AM-PAC PT "6 Clicks" Mobility   Outcome Measure  Help  needed turning from your back to your side while in a flat bed without using bedrails?: A Little Help needed moving from lying on your back to sitting on the side of a flat bed without using bedrails?: A Little Help needed moving to and from a bed to a chair (including a wheelchair)?: A Little Help needed standing up from a chair using your arms (e.g., wheelchair or bedside chair)?: A Little Help needed to walk in hospital room?: A Little Help needed climbing 3-5 steps with a railing? : A Lot 6 Click Score: 17    End of Session         PT Visit Diagnosis: Difficulty in walking, not elsewhere classified (R26.2)     Time: [id number] PT Time Calculation (min) (ACUTE ONLY): 17 min  Charges:  $Gait Training: 8-22 mins                     Baxter Flattery, PT   Acute Rehab Dept [facility]): [id number]   03/08/2020    [facility] 03/08/2020, 11:28  AM

## 2020-03-08 NOTE — Progress Notes (Signed)
Subjective: 1 Day Post-Op Procedure(s) (LRB): TOTAL KNEE ARTHROPLASTY (Left) Patient reports pain as moderate.   Patient seen in rounds by Dr. Wynelle Link. Patient is having issues with pain control. Received call last night that patient was having increased levels of pain despite use of IV morphine. Switched to 0.5-1 mg IV dilaudid Q2 prn breakthrough pain. This AM during rounds, patient still complaining of pain. PO medication oxycodone increased to 5-15 mg Q4 prn. Will continue to monitor. Denies chest pain, SOB, or calf pain. Foley catheter to be removed this AM. Will continue therapy today, ambulated 47' yesterday afternoon.   Objective: Vital signs in last 24 hours: Temp:  [97.4 F (36.3 C)-98.9 F (37.2 C)] 98.4 F (36.9 C) (03/23 0631) Pulse Rate:  [56-102] 101 (03/23 0631) Resp:  [10-20] 18 (03/23 0631) BP: (120-159)/(76-97) 159/97 (03/23 0631) SpO2:  [100 %] 100 % (03/23 0631) Weight:  [97.1 kg] 97.1 kg (03/22 0807)  Intake/Output from previous day:  Intake/Output Summary (Last 24 hours) at 03/08/2020 0730 Last data filed at 03/08/2020 0542 Gross per 24 hour  Intake 4852.41 ml  Output 3000 ml  Net 1852.41 ml     Intake/Output this shift: No intake/output data recorded.  Labs: Recent Labs    03/08/20 0307  HGB 13.4   Recent Labs    03/08/20 0307  WBC 13.1*  RBC 4.22  HCT 37.8*  PLT 137*   Recent Labs    03/08/20 0307  NA 137  K 3.8  CL 102  CO2 26  BUN 13  CREATININE 1.22  GLUCOSE 167*  CALCIUM 8.7*   No results for input(s): LABPT, INR in the last 72 hours.  Exam: General - Patient is Alert and Oriented Extremity - Neurologically intact Neurovascular intact Sensation intact distally Dorsiflexion/Plantar flexion intact Dressing - dressing C/D/I Motor Function - intact, moving foot and toes well on exam.   Past Medical History:  Diagnosis Date  . Asthma   . Depression    h/o  . Diabetes mellitus   . GERD (gastroesophageal reflux  disease)   . H/O Clostridium difficile infection 08/2015  . Hyperlipidemia   . Hypertension   . Insomnia   . Polyarthralgia 2009   blood work (-) CKs slightly elevated, bone san (-) saw rheumatology; continue w/ somptoms after holding zocor    Assessment/Plan: 1 Day Post-Op Procedure(s) (LRB): TOTAL KNEE ARTHROPLASTY (Left) Principal Problem:   OA (osteoarthritis) of knee Active Problems:   Post-traumatic osteoarthritis of one knee, left  Estimated body mass index is 30.72 kg/m as calculated from the following:   Height as of this encounter: 5\' 10"  (1.778 m).   Weight as of this encounter: 97.1 kg. Advance diet Up with therapy D/C IV fluids  Anticipated LOS equal to or greater than 2 midnights due to - Age 110 and older with one or more of the following:  - Obesity  - Expected need for hospital services (PT, OT, Nursing) required for safe  discharge  - Anticipated need for postoperative skilled nursing care or inpatient rehab  - Active co-morbidities: Chronic pain requiring opiods and Diabetes OR   - Unanticipated findings during/Post Surgery: None  - Patient is a high risk of re-admission due to: None    DVT Prophylaxis - Aspirin Weight bearing as tolerated. D/C O2 and pulse ox and try on room air. Hemovac pulled without difficulty, will continue therapy today.  Plan is to go Home after hospital stay. Plan for discharge later today if meeting goals  with therapy and pain controlled with PO medications ONLY. Scheduled for outpatient therapy at [facility]. Follow-up in the office in 2 weeks.   Theresa Duty, PA-C Orthopedic Surgery [phone] 03/08/2020, 7:30 AM

## 2020-03-08 NOTE — Plan of Care (Signed)
Patient discharged home in stable condition 

## 2020-03-08 NOTE — Progress Notes (Signed)
03/08/20 1300  PT Visit Information  Last PT Received On 03/08/20  Pt progressing well. Pain much improved from earlier today; reviewed TKA, HEP, gait/safety and stairs. Family present for stair training. Pt is ready for d/c from PT standpoint  Assistance Needed +1  History of Present Illness s/p L TKA. PMH: cspine surgery, PTSD, chronic pain, HTN, DM, multiple rib and comp fxs d/t traumatic injury  Subjective Data  Patient Stated Goal get back to walking 4-5 miles a day  Precautions  Precautions Fall;Knee  Required Braces or Orthoses Knee Immobilizer - Left  Knee Immobilizer - Left Discontinue once straight leg raise with < 10 degree lag  Restrictions  Weight Bearing Restrictions No  Other Position/Activity Restrictions WBAT  Pain Assessment  Pain Assessment 0-10  Pain Score 5  Pain Location L knee  Pain Descriptors / Indicators Grimacing;Sore;Guarding  Pain Intervention(s) Limited activity within patient's tolerance;Monitored during session;Premedicated before session;Repositioned;Ice applied  Cognition  Arousal/Alertness Awake/alert  Behavior During Therapy WFL for tasks assessed/performed  Overall Cognitive Status Within Functional Limits for tasks assessed  Bed Mobility  Overal bed mobility Needs Assistance  Bed Mobility Sit to Supine  Sit to supine Min guard  General bed mobility comments for safety, incr    Transfers  Overall transfer level Needs assistance  Equipment used Rolling walker (2 wheeled)  Transfers Sit to/from Stand  Sit to Stand Min guard  General transfer comment cues for hand placement, min/guard for safety   Ambulation/Gait  Ambulation/Gait assistance Min guard  Gait Distance (Feet) 65 Feet  Assistive device Rolling walker (2 wheeled)  Gait Pattern/deviations Step-to pattern;Decreased stance time - left  General Gait Details cues for sequence and RW position, use of UEs to un-wt LLE for improved pain control  Stairs Yes  Stairs assistance Min  guard  Stair Management One rail Right;Forwards;With cane;Step to pattern  Number of Stairs 5 (x2)  General stair comments cues for sequence and technique  Total Joint Exercises  Ankle Circles/Pumps AROM;Both;10 reps  Quad Sets 10 reps;Both;AROM  Heel Slides AAROM;5 reps;Left  Short Arc Quad AROM;Left;5 reps  Hip ABduction/ADduction AROM;Left;10 reps  Straight Leg Raises AROM;Left;10 reps  PT - End of Session  Equipment Utilized During Treatment Gait belt;Left knee immobilizer  Activity Tolerance Patient tolerated treatment well  Patient left in bed;with call bell/phone within reach;with family/visitor present  Nurse Communication Mobility status   PT - Assessment/Plan  PT Plan Current plan remains appropriate  PT Visit Diagnosis Difficulty in walking, not elsewhere classified (R26.2)  PT Frequency (ACUTE ONLY) 7X/week  Follow Up Recommendations Follow surgeon's recommendation for DC plan and follow-up therapies  PT equipment None recommended by PT  AM-PAC PT "6 Clicks" Mobility Outcome Measure (Version 2)  Help needed turning from your back to your side while in a flat bed without using bedrails? 3  Help needed moving from lying on your back to sitting on the side of a flat bed without using bedrails? 3  Help needed moving to and from a bed to a chair (including a wheelchair)? 3  Help needed standing up from a chair using your arms (e.g., wheelchair or bedside chair)? 3  Help needed to walk in hospital room? 3  Help needed climbing 3-5 steps with a railing?  2  6 Click Score 17  Consider Recommendation of Discharge To: Home with [facility]  PT Goal Progression  Progress towards PT goals Progressing toward goals  Acute Rehab PT Goals  PT Goal Formulation With patient  Time For Goal Achievement 03/14/20  Potential to Achieve Goals Good  PT Time Calculation  PT Start Time (ACUTE ONLY) 1255  PT Stop Time (ACUTE ONLY) 1324  PT Time Calculation (min) (ACUTE ONLY) 29 min  PT General  Charges  $$ ACUTE PT VISIT 1 Visit  PT Treatments  $Gait Training 8-22 mins  $Therapeutic Exercise 8-22 mins

## 2020-03-14 ENCOUNTER — Other Ambulatory Visit: Payer: Self-pay | Admitting: Internal Medicine

## 2020-03-15 MED ORDER — PANTOPRAZOLE SODIUM 40 MG PO TBEC: 40.0000 mg | DELAYED_RELEASE_TABLET | Freq: Two times a day (BID) | ORAL | 3 refills | Status: DC

## 2020-03-15 MED ORDER — METFORMIN HCL 850 MG PO TABS: 850.0000 mg | ORAL_TABLET | Freq: Two times a day (BID) | ORAL | 1 refills | Status: DC

## 2020-04-12 ENCOUNTER — Other Ambulatory Visit: Payer: Self-pay | Admitting: Internal Medicine

## 2020-04-27 ENCOUNTER — Encounter: Payer: Self-pay | Admitting: Internal Medicine

## 2020-04-27 ENCOUNTER — Other Ambulatory Visit: Payer: Self-pay

## 2020-04-27 ENCOUNTER — Ambulatory Visit: Payer: 59 | Admitting: Internal Medicine

## 2020-04-27 VITALS — BP 138/87 | HR 89 | Temp 98.2°F | Resp 18 | Ht 70.0 in | Wt 202.0 lb

## 2020-04-27 DIAGNOSIS — K649 Unspecified hemorrhoids: Secondary | ICD-10-CM

## 2020-04-27 DIAGNOSIS — K625 Hemorrhage of anus and rectum: Secondary | ICD-10-CM

## 2020-04-27 DIAGNOSIS — G4700 Insomnia, unspecified: Secondary | ICD-10-CM | POA: Diagnosis not present

## 2020-04-27 DIAGNOSIS — E119 Type 2 diabetes mellitus without complications: Secondary | ICD-10-CM

## 2020-04-27 DIAGNOSIS — E118 Type 2 diabetes mellitus with unspecified complications: Secondary | ICD-10-CM

## 2020-04-27 DIAGNOSIS — M549 Dorsalgia, unspecified: Secondary | ICD-10-CM

## 2020-04-27 DIAGNOSIS — I10 Essential (primary) hypertension: Secondary | ICD-10-CM

## 2020-04-27 DIAGNOSIS — F419 Anxiety disorder, unspecified: Secondary | ICD-10-CM

## 2020-04-27 DIAGNOSIS — R634 Abnormal weight loss: Secondary | ICD-10-CM | POA: Diagnosis not present

## 2020-04-27 DIAGNOSIS — R21 Rash and other nonspecific skin eruption: Secondary | ICD-10-CM

## 2020-04-27 LAB — CBC WITH DIFFERENTIAL/PLATELET
Basophils Absolute: 0 10*3/uL (ref 0.0–0.1)
Basophils Relative: 0.7 % (ref 0.0–3.0)
Eosinophils Absolute: 1.2 10*3/uL — ABNORMAL HIGH (ref 0.0–0.7)
Eosinophils Relative: 18.6 % — ABNORMAL HIGH (ref 0.0–5.0)
HCT: 35.9 % — ABNORMAL LOW (ref 39.0–52.0)
Hemoglobin: 12.4 g/dL — ABNORMAL LOW (ref 13.0–17.0)
Lymphocytes Relative: 34.5 % (ref 12.0–46.0)
Lymphs Abs: 2.3 10*3/uL (ref 0.7–4.0)
MCHC: 34.6 g/dL (ref 30.0–36.0)
MCV: 86.4 fl (ref 78.0–100.0)
Monocytes Absolute: 0.6 10*3/uL (ref 0.1–1.0)
Monocytes Relative: 8.5 % (ref 3.0–12.0)
Neutro Abs: 2.5 10*3/uL (ref 1.4–7.7)
Neutrophils Relative %: 37.7 % — ABNORMAL LOW (ref 43.0–77.0)
Platelets: 168 10*3/uL (ref 150.0–400.0)
RBC: 4.16 Mil/uL — ABNORMAL LOW (ref 4.22–5.81)
RDW: 16.7 % — ABNORMAL HIGH (ref 11.5–15.5)
WBC: 6.6 10*3/uL (ref 4.0–10.5)

## 2020-04-27 LAB — HEMOGLOBIN A1C: Hgb A1c MFr Bld: 4.8 % (ref 4.6–6.5)

## 2020-04-27 LAB — TSH: TSH: 2.26 u[IU]/mL (ref 0.35–4.50)

## 2020-04-27 MED ORDER — HYDROCORTISONE 2.5 % EX CREA
TOPICAL_CREAM | Freq: Two times a day (BID) | CUTANEOUS | 0 refills | Status: DC
Start: 2020-04-27 — End: 2020-05-27

## 2020-04-27 MED ORDER — TRAZODONE HCL 50 MG PO TABS: 50.0000 mg | ORAL_TABLET | Freq: Every day | ORAL | 0 refills | Status: DC

## 2020-04-27 MED ORDER — HYDROCORTISONE ACETATE 25 MG RE SUPP
25.0000 mg | Freq: Every day | RECTAL | 1 refills | Status: DC | PRN
Start: 2020-04-27 — End: 2024-05-04

## 2020-04-27 NOTE — Progress Notes (Signed)
Pre visit review using our clinic review tool, if applicable. No additional management support is needed unless otherwise documented below in the visit note. 

## 2020-04-27 NOTE — Progress Notes (Signed)
Subjective:    Patient ID: Daniel [name], male    DOB: September 21, 1966, 54 y.o.   MRN: [id number]  DOS:  04/27/2020 Type of visit - description: rov Multiple issues discussed:  Pain management Weight loss: He has noted weight loss, confirmed in our scales.  Appetite is decreased, he is not exercising much due to recent surgery, he thinks he is losing muscle mass. No fever chills  Also rectal bleeding with every BM, blood is fresh, here lately has noticed some mucus mixed with the blood. He has 2 to 3 BMs daily which is close to his baseline, no nausea, vomiting.  No diarrhea.  No abdominal pain.  Admits to some dysphagia, to see ENT soon.  We also talk about his blood pressure and diabetes  Insomnia, not well controlled on Ambien  Wt Readings from Last 3 Encounters:  04/27/20 202 lb (91.6 kg)  03/07/20 214 lb 1.1 oz (97.1 kg)  02/26/20 214 lb (97.1 kg)    Review of Systems No LUTS  Past Medical History:  Diagnosis Date  . Asthma   . Depression    h/o  . Diabetes mellitus   . GERD (gastroesophageal reflux disease)   . H/O Clostridium difficile infection 08/2015  . Hyperlipidemia   . Hypertension   . Insomnia   . Polyarthralgia 2009   blood work (-) CKs slightly elevated, bone san (-) saw rheumatology; continue w/ somptoms after holding zocor    Past Surgical History:  Procedure Laterality Date  . CERVICAL SPINE SURGERY  05/31/2019   operated on C4-C5  . HAND SURGERY Left 2006  . HEMORRHOID SURGERY    . HERNIA REPAIR  [id number]   umbilical  . KNEE SURGERY Right 2011   . NASAL FRACTURE SURGERY  2006  . TOTAL KNEE ARTHROPLASTY Left 03/07/2020   Procedure: TOTAL KNEE ARTHROPLASTY;  Surgeon: Gaynelle Arabian, MD;  Location: [facility];  Service: Orthopedics;  Laterality: Left;  75min    Allergies as of 04/27/2020      Reactions   Levofloxacin Other (See Comments)   Tendon rupture at wrist      Medication List       Accurate as of Apr 27, 2020  1:11 PM. If you have any  questions, ask your nurse or doctor.        albuterol 108 (90 Base) MCG/ACT inhaler Commonly known as: VENTOLIN HFA Inhale 1-2 puffs into the lungs every 6 (six) hours as needed for wheezing or shortness of breath.   carvedilol 12.5 MG tablet Commonly known as: COREG Take 1 tablet (12.5 mg total) by mouth 2 (two) times daily with a meal.   cyclobenzaprine 10 MG tablet Commonly known as: FLEXERIL Take 1 tablet (10 mg total) by mouth 3 (three) times daily as needed for muscle spasms. What changed: Another medication with the same name was removed. Continue taking this medication, and follow the directions you see here. Changed by: Kathlene November, MD   fish oil-omega-3 fatty acids 1000 MG capsule Take 1,000 mg by mouth daily.   Flax Seed Oil 1000 MG Caps Take 1,000 mg by mouth at bedtime.   gabapentin 300 MG capsule Commonly known as: NEURONTIN Take 1 capsule (300 mg total) by mouth 3 (three) times daily. Take a 300 mg capsule three times a day for two weeks following surgery.Then take a 300 mg capsule two times a day for two weeks. Then take a 300 mg capsule once a day for two weeks. Then discontinue.  losartan 100 MG tablet Commonly known as: COZAAR Take 1 tablet (100 mg total) by mouth daily.   metFORMIN 850 MG tablet Commonly known as: GLUCOPHAGE Take 1 tablet (850 mg total) by mouth 2 (two) times daily with a meal.   multivitamin with minerals Tabs tablet Take 1 tablet by mouth daily.   OneTouch Delica Lancets [id number] Misc Check blood sugars once daily   OneTouch Ultra test strip Generic drug: glucose blood Check blood sugars once daily   oxyCODONE 5 MG immediate release tablet Commonly known as: Oxy IR/ROXICODONE Take 1-3 tablets (5-15 mg total) by mouth every 6 (six) hours as needed for severe pain.   pantoprazole 40 MG tablet Commonly known as: PROTONIX Take 1 tablet (40 mg total) by mouth 2 (two) times daily before a meal.   PROBIOTIC DAILY PO Take 1 tablet by  mouth daily.   Red Yeast Rice 600 MG Caps Take 600 mg by mouth daily.   tadalafil 20 MG tablet Commonly known as: CIALIS Take 20 mg by mouth daily as needed for erectile dysfunction.   traMADol 50 MG tablet Commonly known as: ULTRAM Take 1-2 tablets (50-100 mg total) by mouth every 6 (six) hours as needed for moderate pain.   valACYclovir 500 MG tablet Commonly known as: VALTREX Take 1 tablet (500 mg total) by mouth daily.   zolpidem 12.5 MG CR tablet Commonly known as: AMBIEN CR TAKE 1 TABLET(12.5 MG) BY MOUTH AT BEDTIME AS NEEDED FOR SLEEP What changed: See the new instructions.          Objective:   Physical Exam BP 138/87 (BP Location: Left Arm, Patient Position: Sitting, Cuff Size: Normal)   Pulse 89   Temp 98.2 F (36.8 C) (Temporal)   Resp 18   Ht 5\' 10"  (1.778 m)   Wt 202 lb (91.6 kg)   SpO2 100%   BMI 28.98 kg/m  General:   Well developed, NAD, BMI noted.  HEENT:  Normocephalic . Face symmetric, atraumatic Lungs:  CTA B Normal respiratory effort, no intercostal retractions, no accessory muscle use. Heart: RRR,  no murmur.  Abdomen:  Not distended, soft, non-tender. No rebound or rigidity.   Skin: Pretibial areas: Macular, hyperpigmented skin mostly on the left, in a foliar distribution.  Similar but much smaller areas at the lower back. Lower extremities: no pretibial edema bilaterally  Neurologic:  alert & oriented X3.  Speech normal, gait and transfer: Greatly difficulty by knee pain DRE: External exam with a large skin tag.  Moderately enlarged prostate without tenderness. Anoscopy: No stools found, large internal hemorrhoids without active bleeding.  No stools or mucus found. Psych--  Cognition and judgment appear intact.  Cooperative with normal attention span and concentration.  Behavior appropriate. No anxious or depressed appearing.     Assessment     Assessment  DM - dc metformin 08-2015, had diarrhea but likely dt cdiff, ok to  restart if needed  HTN Hyperlipidemia Anxiety, insomnia, History of Prozac intolerance "it may me like to hurt people".  Asthma GERD Insomnia Polyarthralgia: w/u and rheumatology eval neg 2009, stopping zocor did not help Diarrhea, + c diff 08-2015, s/p abx  Major injury at work 01/2019, pain management per Ortho   PLAN DM: On Metformin, check A1c. HTN: Seems well controlled, continue carvedilol, losartan.  Recheck a BMP Anxiety, insomnia: Not sleeping well, reports d/t a combination of pain and some anxiety. Ambien is no help him as much, request to change meds. In the past he  tried clonazepam, he is intolerant to SSRIs. Plan: Trazodone. Major injury: Still having back pain. Had a TKR 02-2020, used gabapentin and Ultram temporarily, now on OxyContin only. We agreed he will contact ortho for RFs   Weight loss: New issue, the patient believes he is losing muscle mass and that is certainly possible.  We will check a TSH. Red blood per rectum: Suspect a distal pathology, anoscopy showed large internal  hemorrhoids.  He does have occasional mucus on the stools.  He is up-to-date on CCS, benign polyps per colonoscopy 2015. Plan: Hydrocortisone suppositories, if not better refer to GI. Rash: Also, reports a rash in the back and legs, for 4 months, hydrocortisone OTC make it better.  On exam the rash is nonspecific, postinflammatory hyperpigmentation?.  Recommend hydrocortisone 2.5% twice a day for now. RTC 3 months   This visit occurred during the SARS-CoV-2 public health emergency.  Safety protocols were in place, including screening questions prior to the visit, additional usage of staff PPE, and extensive cleaning of exam room while observing appropriate contact time as indicated for disinfecting solutions.

## 2020-04-27 NOTE — Patient Instructions (Addendum)
Per our records you are due for an eye exam. Please contact your eye doctor to schedule an appointment. Please have them send copies of your office visit notes to [place]. Our fax number is [phone].  Use the suppositories daily for 1 week, then as needed  If the bleeding and mucus does not stop please let me know  Stop Ambien  Start trazodone 50 mg: 1 tablet at night, if needed take 2.  Call for refills if needed  Using hydrocortisone 2.5% cream twice a day on the rash, for 2 weeks, call if not better   GO TO THE LAB : Get the blood work     [place], [place] back for a checkup in 3 months

## 2020-04-27 NOTE — Assessment & Plan Note (Signed)
DM: On Metformin, check A1c. HTN: Seems well controlled, continue carvedilol, losartan.  Recheck a BMP Anxiety, insomnia: Not sleeping well, reports d/t a combination of pain and some anxiety. Ambien is no help him as much, request to change meds. In the past he tried clonazepam, he is intolerant to SSRIs. Plan: Trazodone. Major injury: Still having back pain. Had a TKR 02-2020, used gabapentin and Ultram temporarily, now on OxyContin only. We agreed he will contact ortho for RFs   Weight loss: New issue, the patient believes he is losing muscle mass and that is certainly possible.  We will check a TSH. Red blood per rectum: Suspect a distal pathology, anoscopy showed large internal  hemorrhoids.  He does have occasional mucus on the stools.  He is up-to-date on CCS, benign polyps per colonoscopy 2015. Plan: Hydrocortisone suppositories, if not better refer to GI. Rash: Also, reports a rash in the back and legs, for 4 months, hydrocortisone OTC make it better.  On exam the rash is nonspecific, postinflammatory hyperpigmentation?.  Recommend hydrocortisone 2.5% twice a day for now. RTC 3 months

## 2020-04-28 NOTE — Addendum Note (Signed)
Addended byDamita Dunnings D on: 04/28/2020 03:06 PM   Modules accepted: Orders

## 2020-05-04 ENCOUNTER — Other Ambulatory Visit: Payer: Self-pay | Admitting: Otolaryngology

## 2020-05-11 ENCOUNTER — Other Ambulatory Visit: Payer: Self-pay | Admitting: Internal Medicine

## 2020-05-13 ENCOUNTER — Other Ambulatory Visit: Payer: Self-pay | Admitting: Otolaryngology

## 2020-05-16 ENCOUNTER — Other Ambulatory Visit: Payer: Self-pay | Admitting: Internal Medicine

## 2020-05-17 ENCOUNTER — Other Ambulatory Visit: Payer: Self-pay | Admitting: Otolaryngology

## 2020-05-17 DIAGNOSIS — R1314 Dysphagia, pharyngoesophageal phase: Secondary | ICD-10-CM

## 2020-05-20 ENCOUNTER — Ambulatory Visit
Admission: RE | Admit: 2020-05-20 | Discharge: 2020-05-20 | Disposition: A | Discharge status: Home / Self Care | Payer: No Typology Code available for payment source | Source: Ambulatory Visit | Attending: Otolaryngology | Admitting: Otolaryngology

## 2020-05-20 DIAGNOSIS — R1314 Dysphagia, pharyngoesophageal phase: Secondary | ICD-10-CM

## 2020-05-27 ENCOUNTER — Other Ambulatory Visit: Payer: Self-pay | Admitting: Internal Medicine

## 2020-06-07 LAB — HM DIABETES EYE EXAM

## 2020-06-21 ENCOUNTER — Encounter: Payer: Self-pay | Admitting: Internal Medicine

## 2020-07-06 ENCOUNTER — Other Ambulatory Visit: Payer: Self-pay | Admitting: Internal Medicine

## 2020-07-27 ENCOUNTER — Ambulatory Visit: Payer: 59 | Admitting: Internal Medicine

## 2020-07-27 ENCOUNTER — Other Ambulatory Visit: Payer: Self-pay

## 2020-07-27 VITALS — BP 112/75 | HR 88 | Temp 98.7°F | Resp 12 | Ht 70.0 in | Wt 214.4 lb

## 2020-07-27 DIAGNOSIS — K625 Hemorrhage of anus and rectum: Secondary | ICD-10-CM | POA: Diagnosis not present

## 2020-07-27 DIAGNOSIS — I10 Essential (primary) hypertension: Secondary | ICD-10-CM | POA: Diagnosis not present

## 2020-07-27 DIAGNOSIS — E1165 Type 2 diabetes mellitus with hyperglycemia: Secondary | ICD-10-CM

## 2020-07-27 DIAGNOSIS — F419 Anxiety disorder, unspecified: Secondary | ICD-10-CM | POA: Diagnosis not present

## 2020-07-27 DIAGNOSIS — G4700 Insomnia, unspecified: Secondary | ICD-10-CM

## 2020-07-27 LAB — BASIC METABOLIC PANEL
BUN: 19 mg/dL (ref 6–23)
CO2: 24 mEq/L (ref 19–32)
Calcium: 9.5 mg/dL (ref 8.4–10.5)
Chloride: 102 mEq/L (ref 96–112)
Creatinine, Ser: 1.47 mg/dL (ref 0.40–1.50)
GFR: 59.92 mL/min — ABNORMAL LOW (ref >60.00)
Glucose, Bld: 129 mg/dL — ABNORMAL HIGH (ref 70–99)
Potassium: 3.7 mEq/L (ref 3.5–5.1)
Sodium: 137 mEq/L (ref 135–145)

## 2020-07-27 LAB — HEMOGLOBIN A1C: Hgb A1c MFr Bld: 5.8 % (ref 4.6–6.5)

## 2020-07-27 MED ORDER — ALBUTEROL SULFATE HFA 108 (90 BASE) MCG/ACT IN AERS: 1.0000 | INHALATION_SPRAY | Freq: Four times a day (QID) | RESPIRATORY_TRACT | 1 refills | Status: DC | PRN

## 2020-07-27 NOTE — Progress Notes (Signed)
Subjective:    Patient ID: Daniel [name], male    DOB: 12-05-1966, 54 y.o.   MRN: [id number]  DOS:  07/27/2020 Type of visit - description: Routine visit Today we talk about diabetes, insomnia, weight loss. He also was doing better but recently RBPR return, started using suppository 3 days ago. He also developed a cough few days ago, some chest congestion and greenish sputum production.  Denies fever chills No chest pain or difficulty breathing. Request albuterol refill.  Wt Readings from Last 3 Encounters:  07/27/20 214 lb 6.4 oz (97.3 kg)  04/27/20 202 lb (91.6 kg)  03/07/20 214 lb 1.1 oz (97.1 kg)     Review of Systems See above   Past Medical History:  Diagnosis Date  . Asthma   . Depression    h/o  . Diabetes mellitus   . GERD (gastroesophageal reflux disease)   . H/O Clostridium difficile infection 08/2015  . Hyperlipidemia   . Hypertension   . Insomnia   . Polyarthralgia 2009   blood work (-) CKs slightly elevated, bone san (-) saw rheumatology; continue w/ somptoms after holding zocor    Past Surgical History:  Procedure Laterality Date  . CERVICAL SPINE SURGERY  05/31/2019   operated on C4-C5  . HAND SURGERY Left 2006  . HEMORRHOID SURGERY    . HERNIA REPAIR  38/2505   umbilical  . KNEE SURGERY Right 2011   . NASAL FRACTURE SURGERY  2006  . TOTAL KNEE ARTHROPLASTY Left 03/07/2020   Procedure: TOTAL KNEE ARTHROPLASTY;  Surgeon: Gaynelle Arabian, MD;  Location: [facility];  Service: Orthopedics;  Laterality: Left;  19min    Allergies as of 07/27/2020      Reactions   Levofloxacin Other (See Comments)   Tendon rupture at wrist      Medication List       Accurate as of July 27, 2020  1:36 PM. If you have any questions, ask your nurse or doctor.        albuterol 108 (90 Base) MCG/ACT inhaler Commonly known as: VENTOLIN HFA Inhale 1-2 puffs into the lungs every 6 (six) hours as needed for wheezing or shortness of breath.   carvedilol 12.5 MG  tablet Commonly known as: COREG Take 1 tablet (12.5 mg total) by mouth 2 (two) times daily with a meal.   cyclobenzaprine 10 MG tablet Commonly known as: FLEXERIL Take 1 tablet (10 mg total) by mouth 3 (three) times daily as needed for muscle spasms.   fish oil-omega-3 fatty acids 1000 MG capsule Take 1,000 mg by mouth daily.   Flax Seed Oil 1000 MG Caps Take 1,000 mg by mouth at bedtime.   hydrocortisone 2.5 % cream Apply topically in the morning and at bedtime.   hydrocortisone 25 MG suppository Commonly known as: ANUSOL-HC Place 1 suppository (25 mg total) rectally daily as needed for hemorrhoids.   losartan 100 MG tablet Commonly known as: COZAAR Take 1 tablet (100 mg total) by mouth daily.   multivitamin with minerals Tabs tablet Take 1 tablet by mouth daily.   OneTouch Delica Lancets 39J Misc Check blood sugars once daily   OneTouch Ultra test strip Generic drug: glucose blood Check blood sugars once daily   oxyCODONE 5 MG immediate release tablet Commonly known as: Oxy IR/ROXICODONE Take 1-3 tablets (5-15 mg total) by mouth every 6 (six) hours as needed for severe pain.   pantoprazole 40 MG tablet Commonly known as: PROTONIX Take 1 tablet (40 mg total) by  mouth 2 (two) times daily before a meal.   PROBIOTIC DAILY PO Take 1 tablet by mouth daily.   Red Yeast Rice 600 MG Caps Take 600 mg by mouth daily.   tadalafil 20 MG tablet Commonly known as: CIALIS Take 20 mg by mouth daily as needed for erectile dysfunction.   traZODone 50 MG tablet Commonly known as: DESYREL Take 1-2 tablets (50-100 mg total) by mouth at bedtime as needed for sleep.   valACYclovir 500 MG tablet Commonly known as: VALTREX Take 1 tablet (500 mg total) by mouth daily.          Objective:   Physical Exam BP 112/75 (BP Location: Right Arm, Patient Position: Sitting, Cuff Size: Large)   Pulse 88   Temp 98.7 F (37.1 C) (Oral)   Resp 12   Ht 5\' 10"  (1.778 m)   Wt 214 lb  6.4 oz (97.3 kg)   SpO2 95%   BMI 30.76 kg/m  General:   Well developed, NAD, BMI noted. HEENT:  Normocephalic . Face symmetric, atraumatic Lungs:  No wheezing, few rhonchi with cough only. Normal respiratory effort, no intercostal retractions, no accessory muscle use. Heart: RRR,  no murmur.  Lower extremities: no pretibial edema bilaterally  Skin: Not pale. Not jaundice Neurologic:  alert & oriented X3.  Speech normal, gait appropriate for age and unassisted Psych--  Cognition and judgment appear intact.  Cooperative with normal attention span and concentration.  Behavior appropriate. No anxious or depressed appearing.      Assessment     Assessment  DM - dc metformin 08-2015, had diarrhea but likely dt cdiff, ok to restart if needed  HTN Hyperlipidemia Anxiety, insomnia, History of Prozac intolerance "it may me like to hurt people".  Asthma GERD Insomnia Polyarthralgia: w/u and rheumatology eval neg 2009, stopping zocor did not help Diarrhea, + c diff 08-2015, s/p abx  Major injury at work 01/2019, pain management per Ortho   PLAN DM: Last A1c excellent, Metformin discontinue.  He is active, CBGs at home in the 120s.  Check A1c HTN: Controlled, continue carvedilol, losartan.  Check BMP Anxiety insomnia: See last visit, added trazodone, is helping.  No change. Asthma exacerbation: Mild cough and chest congestion for few days with no fever or chills. He is already taking Mucinex, we agreed he will use albuterol as needed and let me know if not improving.  See AVS. RBPR: See last visit, symptoms quickly improved but they come back 3 days ago, just started to use the suppositories.  Advised to let me know if this episode does not resolve quickly or if this happens again, will need GI eval. Weight loss: See last visit, resolved. RTC 4 months   This visit occurred during the SARS-CoV-2 public health emergency.  Safety protocols were in place, including screening questions  prior to the visit, additional usage of staff PPE, and extensive cleaning of exam room while observing appropriate contact time as indicated for disinfecting solutions.

## 2020-07-27 NOTE — Patient Instructions (Addendum)
For cough: Continue taking Mucinex DM   albuterol, use it for wheezing or persistent cough.  If your symptoms are not getting better or if you develop fever, chills, increased chest congestion:  please let me know.  Rectal bleeding: If you do not improve soon or if you have symptoms again let me know for referral to the gastroenterologist  [facility] : Get the blood work     [place], [place] back for  A check up in 4 months

## 2020-07-28 ENCOUNTER — Encounter: Payer: Self-pay | Admitting: Internal Medicine

## 2020-07-28 NOTE — Assessment & Plan Note (Signed)
DM: Last A1c excellent, Metformin discontinue.  He is active, CBGs at home in the 120s.  Check A1c HTN: Controlled, continue carvedilol, losartan.  Check BMP Anxiety insomnia: See last visit, added trazodone, is helping.  No change. Asthma exacerbation: Mild cough and chest congestion for few days with no fever or chills. He is already taking Mucinex, we agreed he will use albuterol as needed and let me know if not improving.  See AVS. RBPR: See last visit, symptoms quickly improved but they come back 3 days ago, just started to use the suppositories.  Advised to let me know if this episode does not resolve quickly or if this happens again, will need GI eval. Weight loss: See last visit, resolved. RTC 4 months

## 2020-08-04 ENCOUNTER — Telehealth: Payer: Self-pay | Admitting: Internal Medicine

## 2020-08-04 MED ORDER — AZITHROMYCIN 250 MG PO TABS
ORAL_TABLET | ORAL | 0 refills | Status: DC
Start: 2020-08-04 — End: 2020-11-28

## 2020-08-04 NOTE — Telephone Encounter (Signed)
Please advise 

## 2020-08-04 NOTE — Telephone Encounter (Signed)
He had a mild asthma exacerbation when I saw him 07/27/2020. Please send a Z-Pak. Mucinex DM, rest, fluids. Recommend to be check for Covid

## 2020-08-04 NOTE — Telephone Encounter (Signed)
Spoke w/ Pt- informed of recommendations. He tested negative for COVID 3 days ago. Zpak sent to [facility].

## 2020-08-04 NOTE — Telephone Encounter (Signed)
Per Patient, Dr Larose Kells ask patient to call back if symptoms had not improved. Patient states he is still congested. Patient would like an antibiotic sent to Pharmacy. Per patient, Larose Kells stated he would send in medication if no improvement.    Please Advise

## 2020-09-08 ENCOUNTER — Ambulatory Visit (INDEPENDENT_AMBULATORY_CARE_PROVIDER_SITE_OTHER): Payer: 59

## 2020-09-08 ENCOUNTER — Other Ambulatory Visit: Payer: Self-pay

## 2020-09-08 DIAGNOSIS — Z23 Encounter for immunization: Secondary | ICD-10-CM | POA: Diagnosis not present

## 2020-09-08 NOTE — Progress Notes (Signed)
Daniel [name] is a 54 y.o. male presents to the office today for a flu injections  Flu shot was administered im on left deltoid today. Patient tolerated injection.  Jiles Prows

## 2020-10-27 ENCOUNTER — Other Ambulatory Visit: Payer: Self-pay | Admitting: Internal Medicine

## 2020-11-24 ENCOUNTER — Other Ambulatory Visit: Payer: Self-pay | Admitting: Internal Medicine

## 2020-11-28 ENCOUNTER — Other Ambulatory Visit: Payer: Self-pay

## 2020-11-28 ENCOUNTER — Ambulatory Visit: Payer: 59 | Admitting: Internal Medicine

## 2020-11-28 ENCOUNTER — Encounter: Payer: Self-pay | Admitting: Internal Medicine

## 2020-11-28 VITALS — BP 124/82 | HR 68 | Temp 98.1°F | Ht 70.0 in | Wt 218.4 lb

## 2020-11-28 DIAGNOSIS — E118 Type 2 diabetes mellitus with unspecified complications: Secondary | ICD-10-CM | POA: Diagnosis not present

## 2020-11-28 DIAGNOSIS — G4700 Insomnia, unspecified: Secondary | ICD-10-CM | POA: Diagnosis not present

## 2020-11-28 DIAGNOSIS — F419 Anxiety disorder, unspecified: Secondary | ICD-10-CM | POA: Diagnosis not present

## 2020-11-28 LAB — HEMOGLOBIN A1C: Hgb A1c MFr Bld: 8 % — ABNORMAL HIGH (ref 4.6–6.5)

## 2020-11-28 MED ORDER — ZOLPIDEM TARTRATE ER 12.5 MG PO TBCR: 12.5000 mg | EXTENDED_RELEASE_TABLET | Freq: Every evening | ORAL | 1 refills | Status: DC | PRN

## 2020-11-28 NOTE — Patient Instructions (Signed)
Stop trazodone  Start Ambien 12.5 mg again.  HEALTHY SLEEP Sleep hygiene: Basic rules for a good night's sleep  Sleep only as much as you need to feel rested and then get out of bed  Keep a regular sleep schedule  Avoid forcing sleep  Exercise regularly for at least 20 minutes, preferably 4 to 5 hours before bedtime  Avoid caffeinated beverages after lunch  Avoid alcohol near bedtime: no "night cap"  Avoid smoking, especially in the evening  Do not go to bed hungry  Adjust bedroom environment  Avoid prolonged use of light-emitting screens before bedtime   Deal with your worries before bedtime      GO TO THE LAB : Get the blood work     GO TO [place], [place] back for a physical exam by 01-2021

## 2020-11-28 NOTE — Progress Notes (Signed)
Subjective:    Patient ID: Daniel [name], male    DOB: 1966-03-12, 54 y.o.   MRN: [id number]  DOS:  11/28/2020 Type of visit - description: Routine visit Since the last office visit, back pain has been steady, daily. He is also having difficulty sleeping, occasionally overnight has suicidal thoughts. He thinks part of the issue is trazodone but he is also dealing with PTSD.   Wt Readings from Last 3 Encounters:  11/28/20 218 lb 6.4 oz (99.1 kg)  07/27/20 214 lb 6.4 oz (97.3 kg)  04/27/20 202 lb (91.6 kg)     Review of Systems See above   Past Medical History:  Diagnosis Date  . Asthma   . Depression    h/o  . Diabetes mellitus   . GERD (gastroesophageal reflux disease)   . H/O Clostridium difficile infection 08/2015  . Hyperlipidemia   . Hypertension   . Insomnia   . Polyarthralgia 2009   blood work (-) CKs slightly elevated, bone san (-) saw rheumatology; continue w/ somptoms after holding zocor    Past Surgical History:  Procedure Laterality Date  . CERVICAL SPINE SURGERY  05/31/2019   operated on C4-C5  . HAND SURGERY Left 2006  . HEMORRHOID SURGERY    . HERNIA REPAIR  35/7017   umbilical  . KNEE SURGERY Right 2011   . NASAL FRACTURE SURGERY  2006  . TOTAL KNEE ARTHROPLASTY Left 03/07/2020   Procedure: TOTAL KNEE ARTHROPLASTY;  Surgeon: Gaynelle Arabian, MD;  Location: [facility];  Service: Orthopedics;  Laterality: Left;  59min    Allergies as of 11/28/2020      Reactions   Levofloxacin Other (See Comments)   Tendon rupture at wrist      Medication List       Accurate as of November 28, 2020  1:40 PM. If you have any questions, ask your nurse or doctor.        albuterol 108 (90 Base) MCG/ACT inhaler Commonly known as: VENTOLIN HFA Inhale 1-2 puffs into the lungs every 6 (six) hours as needed for wheezing or shortness of breath.   azithromycin 250 MG tablet Commonly known as: ZITHROMAX Take 2 tablets by mouth first day, then 1 tablet for 4  additional days   carvedilol 12.5 MG tablet Commonly known as: COREG Take 1 tablet (12.5 mg total) by mouth 2 (two) times daily with a meal.   cyclobenzaprine 10 MG tablet Commonly known as: FLEXERIL Take 1 tablet (10 mg total) by mouth 3 (three) times daily as needed for muscle spasms.   fish oil-omega-3 fatty acids 1000 MG capsule Take 1,000 mg by mouth daily.   Flax Seed Oil 1000 MG Caps Take 1,000 mg by mouth at bedtime.   hydrocortisone 2.5 % cream Apply topically in the morning and at bedtime.   hydrocortisone 25 MG suppository Commonly known as: ANUSOL-HC Place 1 suppository (25 mg total) rectally daily as needed for hemorrhoids.   losartan 100 MG tablet Commonly known as: COZAAR Take 1 tablet (100 mg total) by mouth daily.   multivitamin with minerals Tabs tablet Take 1 tablet by mouth daily.   OneTouch Delica Lancets 79T Misc Check blood sugars once daily   OneTouch Ultra test strip Generic drug: glucose blood Check blood sugars once daily   oxyCODONE 5 MG immediate release tablet Commonly known as: Oxy IR/ROXICODONE Take 1-3 tablets (5-15 mg total) by mouth every 6 (six) hours as needed for severe pain.   pantoprazole 40 MG tablet  Commonly known as: PROTONIX Take 1 tablet (40 mg total) by mouth 2 (two) times daily before a meal.   PROBIOTIC DAILY PO Take 1 tablet by mouth daily.   Red Yeast Rice 600 MG Caps Take 600 mg by mouth daily.   tadalafil 20 MG tablet Commonly known as: CIALIS Take 20 mg by mouth daily as needed for erectile dysfunction.   traZODone 50 MG tablet Commonly known as: DESYREL Take 1-2 tablets (50-100 mg total) by mouth at bedtime as needed for sleep.   valACYclovir 500 MG tablet Commonly known as: VALTREX Take 1 tablet (500 mg total) by mouth daily.          Objective:   Physical Exam BP 124/82 (BP Location: Left Arm, Patient Position: Sitting, Cuff Size: Large)   Pulse 68   Temp 98.1 F (36.7 C) (Oral)   Ht 5'  10" (1.778 m)   Wt 218 lb 6.4 oz (99.1 kg)   SpO2 97%   BMI 31.34 kg/m  General:   Well developed, NAD, BMI noted. HEENT:  Normocephalic . Face symmetric, atraumatic Lungs:  CTA B Normal respiratory effort, no intercostal retractions, no accessory muscle use. Heart: RRR,  no murmur.  Lower extremities: no pretibial edema bilaterally  Skin: Not pale. Not jaundice Neurologic:  alert & oriented X3.  Speech normal, gait somewhat limited by pain. Psych--  Cognition and judgment appear intact.  Cooperative with normal attention span and concentration.  Behavior appropriate. No anxious or depressed appearing.      Assessment     Assessment  DM - dc metformin 08-2015, had diarrhea but likely dt cdiff, ok to restart if needed  HTN Hyperlipidemia Anxiety, insomnia, History of Prozac intolerance "it may me like to hurt people".  Asthma GERD Insomnia Polyarthralgia: w/u and rheumatology eval neg 2009, stopping zocor did not help Diarrhea, + c diff 08-2015, s/p abx  Major injury at work 01/2019, pain management per Ortho   PLAN DM: Diet controlled, check A1c Major injury: Major back injury now complicated by chronic back pain that has triggered PTSD. Insomnia was previously relatively well controlled with trazodone but now is not. He is having suicidal thoughts, mostly at night when he cannot sleep, request change medications. We agreed to go back on Ambien CR 12.5 mg nightly, tips on healthy sleep provided. He tells me that he is well aware of the phone numbers and resources for suicidal prevention; also  he sees a Teacher, music and a counselor on a monthly basis (from [employer]). Anxiety, insomnia: See above RBPR: See last visit, resolved Preventive care: s/p  covid vax  x3 and a flu shot RTC 01/2021 CPX  This visit occurred during the SARS-CoV-2 public health emergency.  Safety protocols were in place, including screening questions prior to the visit, additional usage of  staff PPE, and extensive cleaning of exam room while observing appropriate contact time as indicated for disinfecting solutions.

## 2020-11-29 ENCOUNTER — Other Ambulatory Visit: Payer: Self-pay

## 2020-11-29 MED ORDER — METFORMIN HCL 850 MG PO TABS: ORAL_TABLET | ORAL | 4 refills | Status: DC

## 2020-12-23 LAB — PSA: PSA: 0.78

## 2020-12-25 ENCOUNTER — Other Ambulatory Visit: Payer: Self-pay | Admitting: Internal Medicine

## 2020-12-29 ENCOUNTER — Other Ambulatory Visit: Payer: Self-pay | Admitting: Internal Medicine

## 2021-01-11 ENCOUNTER — Encounter: Payer: Self-pay | Admitting: Internal Medicine

## 2021-01-23 ENCOUNTER — Telehealth: Payer: Self-pay | Admitting: Internal Medicine

## 2021-01-23 NOTE — Telephone Encounter (Signed)
Requesting: Ambien CR 12.5mg  Contract: n/a UDS: n/a Last Visit: 11/28/2020 Next Visit: 01/31/21 Last Refill: 11/28/2020 #30 and 1RF  Please Advise

## 2021-01-23 NOTE — Telephone Encounter (Signed)
PDMP okay, Rx sent 

## 2021-01-31 ENCOUNTER — Encounter: Payer: 59 | Admitting: Internal Medicine

## 2021-01-31 ENCOUNTER — Ambulatory Visit (INDEPENDENT_AMBULATORY_CARE_PROVIDER_SITE_OTHER): Payer: 59 | Admitting: Internal Medicine

## 2021-01-31 ENCOUNTER — Other Ambulatory Visit: Payer: Self-pay

## 2021-01-31 ENCOUNTER — Encounter: Payer: Self-pay | Admitting: Internal Medicine

## 2021-01-31 VITALS — BP 120/70 | HR 74 | Temp 98.1°F | Resp 18 | Ht 70.0 in | Wt 219.1 lb

## 2021-01-31 DIAGNOSIS — Z0001 Encounter for general adult medical examination with abnormal findings: Secondary | ICD-10-CM | POA: Diagnosis not present

## 2021-01-31 DIAGNOSIS — G4700 Insomnia, unspecified: Secondary | ICD-10-CM | POA: Diagnosis not present

## 2021-01-31 DIAGNOSIS — E118 Type 2 diabetes mellitus with unspecified complications: Secondary | ICD-10-CM

## 2021-01-31 DIAGNOSIS — Z0000 Encounter for general adult medical examination without abnormal findings: Secondary | ICD-10-CM

## 2021-01-31 DIAGNOSIS — F32A Depression, unspecified: Secondary | ICD-10-CM

## 2021-01-31 DIAGNOSIS — E785 Hyperlipidemia, unspecified: Secondary | ICD-10-CM

## 2021-01-31 DIAGNOSIS — D649 Anemia, unspecified: Secondary | ICD-10-CM

## 2021-01-31 DIAGNOSIS — F419 Anxiety disorder, unspecified: Secondary | ICD-10-CM

## 2021-01-31 NOTE — Patient Instructions (Addendum)
Check the  blood pressure twice a month BP GOAL is between 110/65 and  135/85. GO TO THE LAB : Get the blood work    [place], [place] back for   a checkup in 6 months    Advance Directive  Advance directives are legal documents that allow you to make decisions about your health care and medical treatment in case you become unable to communicate for yourself. Advance directives let your wishes be known to family, friends, and health care providers. Discussing and writing advance directives should happen over time rather than all at once. Advance directives can be changed and updated at any time. There are different types of advance directives, such as:  Medical power of attorney.  Living will.  Do not resuscitate (DNR) order or do not attempt resuscitation (DNAR) order. Health care proxy and medical power of attorney A health care proxy is also called a health care agent. This person is appointed to make medical decisions for you when you are unable to make decisions for yourself. Generally, people ask a trusted friend or family member to act as their proxy and represent their preferences. Make sure you have an agreement with your trusted person to act as your proxy. A proxy may have to make a medical decision on your behalf if your wishes are not known. A medical power of attorney, also called a durable power of attorney for health care, is a legal document that names your health care proxy. Depending on the laws in your state, the document may need to be:  Signed.  Notarized.  Dated.  Copied.  Witnessed.  Incorporated into your medical record. You may also want to appoint a trusted person to manage your money in the event you are unable to do so. This is called a durable power of attorney for finances. It is a separate legal document from the durable power of attorney for health care. You may choose your health care proxy or someone  different to act as your agent in money matters. If you do not appoint a proxy, or there is a concern that the proxy is not acting in your best interest, a court may appoint a guardian to act on your behalf. Living will A living will is a set of instructions that state your wishes about medical care when you cannot express them yourself. Health care providers should keep a copy of your living will in your medical record. You may want to give a copy to family members or friends. To alert caregivers in case of an emergency, you can place a card in your wallet to let them know that you have a living will and where they can find it. A living will is used if you become:  Terminally ill.  Disabled.  Unable to communicate or make decisions. The following decisions should be included in your living will:  To use or not to use life support equipment, such as dialysis machines and breathing machines (ventilators).  Whether you want a DNR or DNAR order. This tells health care providers not to use cardiopulmonary resuscitation (CPR) if breathing or heartbeat stops.  To use or not to use tube feeding.  To be given or not to be given food and fluids.  Whether you want comfort (palliative) care when the goal becomes comfort rather than a cure.  Whether you want to donate your organs and tissues. A living will does not give instructions for  distributing your money and property if you should pass away. DNR or DNAR A DNR or DNAR order is a request not to have CPR in the event that your heart stops beating or you stop breathing. If a DNR or DNAR order has not been made and shared, a health care provider will try to help any patient whose heart has stopped or who has stopped breathing. If you plan to have surgery, talk with your health care provider about how your DNR or DNAR order will be followed if problems occur. What if I do not have an advance directive? Some states assign family decision makers to act  on your behalf if you do not have an advance directive. Each state has its own laws about advance directives. You may want to check with your health care provider, attorney, or state representative about the laws in your state. Summary  Advance directives are legal documents that allow you to make decisions about your health care and medical treatment in case you become unable to communicate for yourself.  The process of discussing and writing advance directives should happen over time. You can change and update advance directives at any time.  Advance directives may include a medical power of attorney, a living will, and a DNR or DNAR order. This information is not intended to replace advice given to you by your health care provider. Make sure you discuss any questions you have with your health care provider. Document Revised: 09/06/2020 Document Reviewed: 09/06/2020 Elsevier Patient Education  2021 [employer].

## 2021-01-31 NOTE — Progress Notes (Signed)
Pre visit review using our clinic review tool, if applicable. No additional management support is needed unless otherwise documented below in the visit note. 

## 2021-01-31 NOTE — Progress Notes (Signed)
Subjective:    Patient ID: Daniel [name], male    DOB: 1966-10-28, 55 y.o.   MRN: [id number]  DOS:  01/31/2021 Type of visit - description: cpx Since the last office visit is doing okay. Depression is still there but somewhat decreased, less intrusive suicidal ideas. Pain is a still an issue.  Review of Systems  Other than above, a 14 point review of systems is negative      Past Medical History:  Diagnosis Date  . Asthma   . Depression    h/o  . Diabetes mellitus   . GERD (gastroesophageal reflux disease)   . H/O Clostridium difficile infection 08/2015  . Hyperlipidemia   . Hypertension   . Insomnia   . Polyarthralgia 2009   blood work (-) CKs slightly elevated, bone san (-) saw rheumatology; continue w/ somptoms after holding zocor    Past Surgical History:  Procedure Laterality Date  . CERVICAL SPINE SURGERY  05/31/2019   operated on C4-C5  . HAND SURGERY Left 2006  . HEMORRHOID SURGERY    . HERNIA REPAIR  [id number]   umbilical  . KNEE SURGERY Right 2011   . NASAL FRACTURE SURGERY  2006  . TOTAL KNEE ARTHROPLASTY Left 03/07/2020   Procedure: TOTAL KNEE ARTHROPLASTY;  Surgeon: Gaynelle Arabian, MD;  Location: [facility];  Service: Orthopedics;  Laterality: Left;  53min    Allergies as of 01/31/2021      Reactions   Levofloxacin Other (See Comments)   Tendon rupture at wrist      Medication List       Accurate as of January 31, 2021 11:59 PM. If you have any questions, ask your nurse or doctor.        albuterol 108 (90 Base) MCG/ACT inhaler Commonly known as: VENTOLIN HFA Inhale 1-2 puffs into the lungs every 6 (six) hours as needed for wheezing or shortness of breath.   carvedilol 12.5 MG tablet Commonly known as: COREG Take 1 tablet (12.5 mg total) by mouth 2 (two) times daily with a meal.   cyclobenzaprine 10 MG tablet Commonly known as: FLEXERIL Take 1 tablet (10 mg total) by mouth 3 (three) times daily as needed for muscle spasms.   fish  oil-omega-3 fatty acids 1000 MG capsule Take 1,000 mg by mouth daily.   Flax Seed Oil 1000 MG Caps Take 1,000 mg by mouth at bedtime.   hydrocortisone 2.5 % cream Apply topically in the morning and at bedtime.   hydrocortisone 25 MG suppository Commonly known as: ANUSOL-HC Place 1 suppository (25 mg total) rectally daily as needed for hemorrhoids.   losartan 100 MG tablet Commonly known as: COZAAR Take 1 tablet (100 mg total) by mouth daily.   metFORMIN 850 MG tablet Commonly known as: Glucophage Half tablet twice a day for 1 week, then 1 tablet twice a day   multivitamin with minerals Tabs tablet Take 1 tablet by mouth daily.   OneTouch Delica Lancets 29J Misc Check blood sugars once daily   OneTouch Ultra test strip Generic drug: glucose blood Check blood sugars once daily   oxyCODONE 5 MG immediate release tablet Commonly known as: Oxy IR/ROXICODONE Take 1-3 tablets (5-15 mg total) by mouth every 6 (six) hours as needed for severe pain.   pantoprazole 40 MG tablet Commonly known as: PROTONIX Take 1 tablet (40 mg total) by mouth 2 (two) times daily before a meal.   PROBIOTIC DAILY PO Take 1 tablet by mouth daily.   Red Yeast  Rice 600 MG Caps Take 600 mg by mouth daily.   tadalafil 20 MG tablet Commonly known as: CIALIS Take 20 mg by mouth daily as needed for erectile dysfunction.   valACYclovir 500 MG tablet Commonly known as: VALTREX Take 1 tablet (500 mg total) by mouth daily.   zolpidem 12.5 MG CR tablet Commonly known as: AMBIEN CR TAKE 1 TABLET(12.5 MG) BY MOUTH AT BEDTIME AS NEEDED FOR SLEEP          Objective:   Physical Exam BP 120/70 (BP Location: Left Arm, Patient Position: Sitting, Cuff Size: Normal)   Pulse 74   Temp 98.1 F (36.7 C) (Oral)   Resp 18   Ht 5\' 10"  (1.778 m)   Wt 219 lb 2 oz (99.4 kg)   SpO2 97%   BMI 31.44 kg/m  General: Well developed, NAD, BMI noted Neck: No  thyromegaly  HEENT:  Normocephalic . Face  symmetric, atraumatic Lungs:  CTA B Normal respiratory effort, no intercostal retractions, no accessory muscle use. Heart: RRR,  no murmur.  Abdomen:  Not distended, soft, non-tender. No rebound or rigidity.   Lower extremities: no pretibial edema bilaterally  Skin: Exposed areas without rash. Not pale. Not jaundice Neurologic:  alert & oriented X3.  Speech normal, gait unassisted, transfers are slow due to pain Strength symmetric and appropriate for age.  Psych: Cognition and judgment appear intact.  Cooperative with normal attention span and concentration.  Behavior appropriate. No anxious or depressed appearing.     Assessment     Assessment  DM  HTN Hyperlipidemia Anxiety, insomnia, History of Prozac intolerance "it may me like to hurt people".  Asthma GERD Insomnia Polyarthralgia: w/u and rheumatology eval neg 2009, stopping zocor did not help Diarrhea, + c diff 08-2015, s/p abx  Major injury at work 01/2019, pain management per Ortho   PLAN Here for CPX DM: Last A1c was significantly elevated compared to previous readings, he went back on Metformin, ambulatory CBGs run from 100-150, encouraged healthy diet, unable to exercise much, check A1c HTN: BP today is well controlled, very good at home, continue same meds Anxiety, insomnia, suicidal ideas: See last visit, on Ambien CR, sleeping somewhat better, suicidal ideas have decreased, nevertheless encouraged to reach out for help if S/I get  more frequent.  He sees a psychiatrist regularly per patient. Major injury: Going to arbitration soon as recommended by his lawyers Anemia: Mild anemia noted, no GI symptoms, checking labs. RTC 6 months   In addition to CPX, chronic medical issues were reviewed. This visit occurred during the SARS-CoV-2 public health emergency.  Safety protocols were in place, including screening questions prior to the visit, additional usage of staff PPE, and extensive cleaning of exam room while  observing appropriate contact time as indicated for disinfecting solutions.

## 2021-02-01 ENCOUNTER — Encounter: Payer: Self-pay | Admitting: Internal Medicine

## 2021-02-01 LAB — COMPREHENSIVE METABOLIC PANEL
ALT: 52 U/L (ref 0–53)
AST: 25 U/L (ref 0–37)
Albumin: 4.4 g/dL (ref 3.5–5.2)
Alkaline Phosphatase: 78 U/L (ref 39–117)
BUN: 19 mg/dL (ref 6–23)
CO2: 26 mEq/L (ref 19–32)
Calcium: 9.5 mg/dL (ref 8.4–10.5)
Chloride: 103 mEq/L (ref 96–112)
Creatinine, Ser: 1.39 mg/dL (ref 0.40–1.50)
GFR: 56.59 mL/min — ABNORMAL LOW (ref >60.00)
Glucose, Bld: 127 mg/dL — ABNORMAL HIGH (ref 70–99)
Potassium: 4.4 mEq/L (ref 3.5–5.1)
Sodium: 140 mEq/L (ref 135–145)
Total Bilirubin: 0.8 mg/dL (ref 0.2–1.2)
Total Protein: 7.1 g/dL (ref 6.0–8.3)

## 2021-02-01 LAB — CBC WITH DIFFERENTIAL/PLATELET
Basophils Absolute: 0.1 10*3/uL (ref 0.0–0.1)
Basophils Relative: 1.1 % (ref 0.0–3.0)
Eosinophils Absolute: 0.3 10*3/uL (ref 0.0–0.7)
Eosinophils Relative: 5.4 % — ABNORMAL HIGH (ref 0.0–5.0)
HCT: 41.1 % (ref 39.0–52.0)
Hemoglobin: 14.4 g/dL (ref 13.0–17.0)
Lymphocytes Relative: 44.8 % (ref 12.0–46.0)
Lymphs Abs: 2.4 10*3/uL (ref 0.7–4.0)
MCHC: 35 g/dL (ref 30.0–36.0)
MCV: 90.7 fl (ref 78.0–100.0)
Monocytes Absolute: 0.4 10*3/uL (ref 0.1–1.0)
Monocytes Relative: 7 % (ref 3.0–12.0)
Neutro Abs: 2.3 10*3/uL (ref 1.4–7.7)
Neutrophils Relative %: 41.7 % — ABNORMAL LOW (ref 43.0–77.0)
Platelets: 124 10*3/uL — ABNORMAL LOW (ref 150.0–400.0)
RBC: 4.53 Mil/uL (ref 4.22–5.81)
RDW: 13.5 % (ref 11.5–15.5)
WBC: 5.5 10*3/uL (ref 4.0–10.5)

## 2021-02-01 LAB — LIPID PANEL
Cholesterol: 168 mg/dL (ref 0–200)
HDL: 30.8 mg/dL — ABNORMAL LOW (ref >39.00)
NonHDL: 137.59
Total CHOL/HDL Ratio: 5
Triglycerides: 233 mg/dL — ABNORMAL HIGH (ref 0.0–149.0)
VLDL: 46.6 mg/dL — ABNORMAL HIGH (ref 0.0–40.0)

## 2021-02-01 LAB — HEMOGLOBIN A1C: Hgb A1c MFr Bld: 5.9 % (ref 4.6–6.5)

## 2021-02-01 LAB — FERRITIN: Ferritin: 469 ng/mL — ABNORMAL HIGH (ref 22.0–322.0)

## 2021-02-01 LAB — IRON: Iron: 68 ug/dL (ref 42–165)

## 2021-02-01 LAB — LDL CHOLESTEROL, DIRECT: Direct LDL: 101 mg/dL

## 2021-02-01 NOTE — Assessment & Plan Note (Signed)
-  Td 2014 - pnm shot 2015;  prevnar 2017 - s/p Shingrix  -COVID VAX x3 - had a flu shot -CCS: Colonoscopy 09-2014, benign polyps, 10 years -Prostate cancer screening: PSA 12/23/2020: 0.7 @  [facility],  Seen there once q year -Diet and exercise discussed  -Labs: CMP, FLP, CBC, iron, ferritin, A1c

## 2021-02-01 NOTE — Assessment & Plan Note (Signed)
Here for CPX DM: Last A1c was significantly elevated compared to previous readings, he went back on Metformin, ambulatory CBGs run from 100-150, encouraged healthy diet, unable to exercise much, check A1c HTN: BP today is well controlled, very good at home, continue same meds Anxiety, insomnia, suicidal ideas: See last visit, on Ambien CR, sleeping somewhat better, suicidal ideas have decreased, nevertheless encouraged to reach out for help if S/I get  more frequent.  He sees a psychiatrist regularly per patient. Major injury: Going to arbitration soon as recommended by his lawyers Anemia: Mild anemia noted, no GI symptoms, checking labs. RTC 6 months

## 2021-02-06 ENCOUNTER — Telehealth: Payer: Self-pay | Admitting: Internal Medicine

## 2021-02-06 MED ORDER — ATORVASTATIN CALCIUM 20 MG PO TABS: 20.0000 mg | ORAL_TABLET | Freq: Every day | ORAL | 1 refills | Status: DC

## 2021-02-06 NOTE — Addendum Note (Signed)
Addended byDamita Dunnings D on: 02/06/2021 12:52 PM   Modules accepted: Orders

## 2021-02-06 NOTE — Telephone Encounter (Signed)
See results notes. 

## 2021-02-06 NOTE — Telephone Encounter (Signed)
Patient calling back about labs

## 2021-02-20 ENCOUNTER — Other Ambulatory Visit: Payer: Self-pay | Admitting: Internal Medicine

## 2021-03-08 ENCOUNTER — Other Ambulatory Visit: Payer: Self-pay | Admitting: Internal Medicine

## 2021-03-20 ENCOUNTER — Other Ambulatory Visit: Payer: Self-pay

## 2021-03-20 ENCOUNTER — Other Ambulatory Visit (INDEPENDENT_AMBULATORY_CARE_PROVIDER_SITE_OTHER): Payer: 59

## 2021-03-20 DIAGNOSIS — E785 Hyperlipidemia, unspecified: Secondary | ICD-10-CM

## 2021-03-21 ENCOUNTER — Other Ambulatory Visit: Payer: Self-pay | Admitting: Internal Medicine

## 2021-03-21 LAB — LDL CHOLESTEROL, DIRECT: Direct LDL: 61 mg/dL

## 2021-03-21 LAB — LIPID PANEL
Cholesterol: 116 mg/dL (ref 0–200)
HDL: 32 mg/dL — ABNORMAL LOW (ref >39.00)
NonHDL: 84.17
Total CHOL/HDL Ratio: 4
Triglycerides: 243 mg/dL — ABNORMAL HIGH (ref 0.0–149.0)
VLDL: 48.6 mg/dL — ABNORMAL HIGH (ref 0.0–40.0)

## 2021-03-21 LAB — ALT: ALT: 31 U/L (ref 0–53)

## 2021-03-21 LAB — AST: AST: 16 U/L (ref 0–37)

## 2021-03-23 MED ORDER — ATORVASTATIN CALCIUM 20 MG PO TABS: 20.0000 mg | ORAL_TABLET | Freq: Every day | ORAL | 3 refills | Status: DC

## 2021-03-23 NOTE — Addendum Note (Signed)
Addended byDamita Dunnings D on: 03/23/2021 07:58 AM   Modules accepted: Orders

## 2021-04-12 ENCOUNTER — Other Ambulatory Visit: Payer: Self-pay | Admitting: Internal Medicine

## 2021-04-18 ENCOUNTER — Other Ambulatory Visit: Payer: Self-pay | Admitting: Internal Medicine

## 2021-04-19 ENCOUNTER — Telehealth: Payer: Self-pay | Admitting: Internal Medicine

## 2021-04-20 NOTE — Telephone Encounter (Signed)
Requesting: Ambien CR 12.5mg  Contract: pain medicine UDS: pain medicine Last Visit: 01/31/2021 Next Visit:  07/31/2021 Last Refill: 01/23/2021 #30 and 2RF  Please Advise

## 2021-04-20 NOTE — Telephone Encounter (Signed)
PDMP okay, Rx sent 

## 2021-07-08 ENCOUNTER — Telehealth: Payer: Self-pay | Admitting: Internal Medicine

## 2021-07-10 NOTE — Telephone Encounter (Signed)
Requesting: zolpidem Contract: 01/09/19 UDS: 04/22/19 Last Visit: 01/31/21 Next Visit: 07/31/21 Last Refill: 04/20/21  Please Advise

## 2021-07-10 NOTE — Telephone Encounter (Signed)
PDMP reviewed, it is a little early but we will go ahead and send the prescription.

## 2021-07-31 ENCOUNTER — Encounter: Payer: Self-pay | Admitting: Internal Medicine

## 2021-07-31 ENCOUNTER — Other Ambulatory Visit: Payer: Self-pay

## 2021-07-31 ENCOUNTER — Ambulatory Visit: Payer: 59 | Admitting: Internal Medicine

## 2021-07-31 VITALS — BP 126/80 | HR 69 | Temp 98.1°F | Resp 16 | Ht 70.0 in | Wt 212.4 lb

## 2021-07-31 DIAGNOSIS — I10 Essential (primary) hypertension: Secondary | ICD-10-CM

## 2021-07-31 DIAGNOSIS — E118 Type 2 diabetes mellitus with unspecified complications: Secondary | ICD-10-CM | POA: Diagnosis not present

## 2021-07-31 LAB — HEMOGLOBIN A1C: Hgb A1c MFr Bld: 5.3 % (ref 4.6–6.5)

## 2021-07-31 LAB — BASIC METABOLIC PANEL
BUN: 13 mg/dL (ref 6–23)
CO2: 26 mEq/L (ref 19–32)
Calcium: 9.2 mg/dL (ref 8.4–10.5)
Chloride: 105 mEq/L (ref 96–112)
Creatinine, Ser: 1.24 mg/dL (ref 0.40–1.50)
GFR: 64.68 mL/min (ref >60.00)
Glucose, Bld: 115 mg/dL — ABNORMAL HIGH (ref 70–99)
Potassium: 4.2 mEq/L (ref 3.5–5.1)
Sodium: 140 mEq/L (ref 135–145)

## 2021-07-31 NOTE — Progress Notes (Signed)
Subjective:    Patient ID: Daniel [name], male    DOB: [birth year], 55 y.o.   MRN: [id number]  DOS:  07/31/2021 Type of visit - description: 66-monthfollow-up  Today with talk about diabetes, hypertension, asthma, insomnia. As per his MSK issues, he will need a redo of the left knee replacement. Also recognizes his balance is not the best, had a couple of falls while going fishing ( on a uneven terrain)   Review of Systems See above   Past Medical History:  Diagnosis Date   Asthma    Depression    h/o   Diabetes mellitus    GERD (gastroesophageal reflux disease)    H/O Clostridium difficile infection 08/2015   Hyperlipidemia    Hypertension    Insomnia    Polyarthralgia 2009   blood work (-) CKs slightly elevated, bone san (-) saw rheumatology; continue w/ somptoms after holding zocor    Past Surgical History:  Procedure Laterality Date   CERVICAL SPINE SURGERY  05/31/2019   operated on C4-C5   HAND SURGERY Left 2006   [place] [id number]  umbilical   KNEE SURGERY Right 2011    NASAL FRACTURE SURGERY  2006   TOTAL KNEE ARTHROPLASTY Left 03/07/2020   Procedure: TOTAL KNEE ARTHROPLASTY;  Surgeon: AGaynelle Arabian MD;  Location: [facility];  Service: Orthopedics;  Laterality: Left;  523m    Allergies as of 07/31/2021       Reactions   Levofloxacin Other (See Comments)   Tendon rupture at wrist        Medication List        Accurate as of July 31, 2021 11:59 PM. If you have any questions, ask your nurse or doctor.          albuterol 108 (90 Base) MCG/ACT inhaler Commonly known as: VENTOLIN HFA Inhale 1-2 puffs into the lungs every 6 (six) hours as needed for wheezing or shortness of breath.   atorvastatin 20 MG tablet Commonly known as: LIPITOR Take 1 tablet (20 mg total) by mouth at bedtime.   carvedilol 12.5 MG tablet Commonly known as: COREG Take 1 tablet (12.5 mg total) by mouth 2 (two) times daily with a meal.    cyclobenzaprine 10 MG tablet Commonly known as: FLEXERIL Take 1 tablet (10 mg total) by mouth 3 (three) times daily as needed for muscle spasms.   fish oil-omega-3 fatty acids 1000 MG capsule Take 1,000 mg by mouth daily.   Flax Seed Oil 1000 MG Caps Take 1,000 mg by mouth at bedtime.   hydrocortisone 2.5 % cream Apply topically in the morning and at bedtime.   hydrocortisone 25 MG suppository Commonly known as: ANUSOL-HC Place 1 suppository (25 mg total) rectally daily as needed for hemorrhoids.   losartan 100 MG tablet Commonly known as: COZAAR Take 1 tablet (100 mg total) by mouth daily.   metFORMIN 850 MG tablet Commonly known as: GLUCOPHAGE Take 1 tablet (850 mg total) by mouth 2 (two) times daily with a meal.   multivitamin with minerals Tabs tablet Take 1 tablet by mouth daily.   OneTouch Delica Lancets [id number] Check blood sugars once daily   OneTouch Ultra test strip Generic drug: glucose blood TEST BLOOD SUGAR ONCE DAILY   oxyCODONE 5 MG immediate release tablet Commonly known as: Oxy IR/ROXICODONE Take 1-3 tablets (5-15 mg total) by mouth every 6 (six) hours as needed for severe pain.   pantoprazole 40  MG tablet Commonly known as: PROTONIX Take 1 tablet (40 mg total) by mouth 2 (two) times daily before a meal.   PROBIOTIC DAILY PO Take 1 tablet by mouth daily.   Red Yeast Rice 600 MG Caps Take 600 mg by mouth daily.   tadalafil 20 MG tablet Commonly known as: CIALIS Take 20 mg by mouth daily as needed for erectile dysfunction.   valACYclovir 500 MG tablet Commonly known as: VALTREX Take 1 tablet (500 mg total) by mouth daily.   zolpidem 12.5 MG CR tablet Commonly known as: AMBIEN CR TAKE 1 TABLET(12.5 MG) BY MOUTH AT BEDTIME AS NEEDED FOR SLEEP           Objective:   Physical Exam BP 126/80 (BP Location: Left Arm, Patient Position: Sitting, Cuff Size: Normal)   Pulse 69   Temp 98.1 F (36.7 C) (Oral)   Resp 16   Ht '5\' 10"'$  (1.778 m)    Wt 212 lb 6 oz (96.3 kg)   SpO2 98%   BMI 30.47 kg/m  General:   Well developed, NAD, BMI noted. HEENT:  Normocephalic . Face symmetric, atraumatic Lungs:  CTA B Normal respiratory effort, no intercostal retractions, no accessory muscle use. Heart: RRR,  no murmur.  Lower extremities: no pretibial edema bilaterally  Skin: Not pale. Not jaundice Neurologic:  alert & oriented X3.  Speech normal, gait unassisted. Psych--  Cognition and judgment appear intact.  Cooperative with normal attention span and concentration.  Behavior appropriate. No anxious or depressed appearing.      Assessment      Assessment  DM  HTN Hyperlipidemia Anxiety, insomnia, History of Prozac intolerance "it may me like to hurt people".  Asthma GERD Insomnia Polyarthralgia: w/u and rheumatology eval neg 2009, stopping zocor did not help Diarrhea, + c diff 08-2015, s/p abx  Major injury at work 01/2019, pain management per Ortho   PLAN DM: On metformin, last A1c 5.9, ambulatory CBGs around 100.  Unable to exercise much, diet is "okay".Check A1c HTN: Ambulatory BPs when checked are normal, continue carvedilol, losartan, check BMP Hyperlipidemia: Started atorvastatin, great response.  No change. MSK: Still under Worker's Comp.  It was determined  he won't  be able to go back to work. Preventive care: Had 4 COVID vaccines, recommend a flu shot this fall. RTC 6 months CPX  This visit occurred during the SARS-CoV-2 public health emergency.  Safety protocols were in place, including screening questions prior to the visit, additional usage of staff PPE, and extensive cleaning of exam room while observing appropriate contact time as indicated for disinfecting solutions.

## 2021-07-31 NOTE — Patient Instructions (Addendum)
Per our records you are due for your diabetic eye exam. Please contact your eye doctor to schedule an appointment. Please have them send copies of your office visit notes to [place]. Our fax number is [phone]. If you need a referral to an eye doctor please let us know.   GO TO THE LAB : Get the blood work     GO TO THE FRONT DESK, PLEASE SCHEDULE YOUR APPOINTMENTS Come back for a physical exam in 6 months

## 2021-08-01 NOTE — Assessment & Plan Note (Signed)
DM: On metformin, last A1c 5.9, ambulatory CBGs around 100.  Unable to exercise much, diet is "okay".Check A1c HTN: Ambulatory BPs when checked are normal, continue carvedilol, losartan, check BMP Hyperlipidemia: Started atorvastatin, great response.  No change. MSK: Still under Worker's Comp.  It was determined  he won't  be able to go back to work. Preventive care: Had 4 COVID vaccines, recommend a flu shot this fall. RTC 6 months CPX

## 2021-08-07 ENCOUNTER — Other Ambulatory Visit: Payer: Self-pay | Admitting: Internal Medicine

## 2021-09-12 ENCOUNTER — Telehealth: Payer: Self-pay | Admitting: Internal Medicine

## 2021-09-12 ENCOUNTER — Other Ambulatory Visit: Payer: Self-pay | Admitting: Family Medicine

## 2021-09-12 DIAGNOSIS — G4700 Insomnia, unspecified: Secondary | ICD-10-CM

## 2021-09-12 MED ORDER — ZOLPIDEM TARTRATE ER 12.5 MG PO TBCR: EXTENDED_RELEASE_TABLET | ORAL | 0 refills | Status: DC

## 2021-09-12 NOTE — Telephone Encounter (Signed)
[name]  Requesting: Ambien CR 12.5mg  Contract: Under contract at pain medicine UDS: under contract at pain medicine Last Visit: 07/31/2021 Next Visit: 01/30/2022 Last Refill: 07/10/2021 #30 and 1RF  New pharmacy  Please Advise

## 2021-09-12 NOTE — Telephone Encounter (Signed)
I have it set to the [facility] on [address].

## 2021-09-12 NOTE — Telephone Encounter (Signed)
Pt. Needs medication sent to different pharmacy:  Rx.:zolpidem (AMBIEN CR) 12.5 MG CR tablet     [address] [place], [place] [address] [phone]

## 2021-10-08 ENCOUNTER — Telehealth: Payer: Self-pay | Admitting: Family Medicine

## 2021-10-08 ENCOUNTER — Other Ambulatory Visit: Payer: Self-pay | Admitting: Internal Medicine

## 2021-10-08 DIAGNOSIS — G4700 Insomnia, unspecified: Secondary | ICD-10-CM

## 2021-10-09 NOTE — Telephone Encounter (Signed)
PDMP okay, Rx sent 

## 2021-10-09 NOTE — Telephone Encounter (Signed)
Requesting: Ambien CR 12.5mg  Contract: under contact at pain medicine UDS: under contract at pain medicine Last Visit: 07/31/2021 Next Visit: 01/30/2022 Last Refill: 09/12/2021 #30 and 0RF  Please Advise

## 2021-10-27 ENCOUNTER — Telehealth: Payer: Self-pay

## 2021-10-27 NOTE — Telephone Encounter (Signed)
Received surgical clearance from [facility]- Pt needing L knee tibial vs total knee arthroplasty revision w/ Dr. Wynelle Link on 01/17/2022. Last OV 07/31/2021, last EKG 02/2020- please advise if Pt needs surgical clearance appt.

## 2021-10-27 NOTE — Telephone Encounter (Signed)
Received fax confirmation

## 2021-10-27 NOTE — Telephone Encounter (Signed)
55 year old gentleman with  well-controlled diabetes, hypertension.   At the last visit he did not mention any chest pain or difficulty breathing. Had a chest CT 02/09/2019, no coronary calcifications described. Advised patient: If he does not have chest pain, difficulty breathing, palpitations and his blood pressure is well controlled>> he is clear.

## 2021-10-27 NOTE — Telephone Encounter (Signed)
Form faxed to [facility]- Attn: Glendale Chard at [phone]. Form sent for scanning.

## 2021-10-27 NOTE — Telephone Encounter (Signed)
Spoke w/ Pt- denies having any symptoms. Informed I'd have PCP complete form and fax back to Dr. Wynelle Link.

## 2021-11-01 NOTE — Telephone Encounter (Signed)
error 

## 2021-11-01 NOTE — Patient Instructions (Addendum)
DUE TO COVID-19 ONLY ONE VISITOR IS ALLOWED TO COME WITH YOU AND STAY IN THE WAITING ROOM ONLY DURING PRE OP AND PROCEDURE.   **NO VISITORS ARE ALLOWED IN THE SHORT STAY AREA OR RECOVERY ROOM!!**  IF YOU WILL BE ADMITTED INTO THE HOSPITAL YOU ARE ALLOWED ONLY TWO SUPPORT PEOPLE DURING VISITATION HOURS ONLY (7AM -8PM)   The support person(s) may change daily. The support person(s) must pass our screening, gel in and out, and wear a mask at all times, including in the patient's room. Patients must also wear a mask when staff or their support person are in the room.  No visitors under the age of 53. Any visitor under the age of 78 must be accompanied by an adult.    COVID SWAB TESTING MUST BE COMPLETED ON:  11/13/21 **MUST PRESENT COMPLETED FORM AT TESTING SITE**    Daniel [name] (backside of the building) Open 8am-3pm. No appointment needed. You are not required to quarantine, however you are required to wear a well-fitted mask when you are out and around people not in your household.  Hand Hygiene often Do NOT share personal items Notify your provider if you are in close contact with someone who has COVID or you develop fever 100.4 or greater, new onset of sneezing, cough, sore throat, shortness of breath or body aches.   Your procedure is scheduled on: 11/15/21   Report to [facility] Main Entrance    Report to admitting at 8:00 AM   Call this number if you have problems the morning of surgery [phone]   Do not eat food :After Midnight.   May have liquids until 7:45 AM day of surgery  CLEAR LIQUID DIET  Foods Allowed                                                                     Foods Excluded  Water, Black Coffee and tea (no milk or creamer)           liquids that you cannot  Plain Jell-O in any flavor  (No red)                                    see through such as: Fruit ices (not with fruit pulp)                                             milk, soups, orange juice              Iced Popsicles (No red)                                                All solid food                                   Apple juices Sports  drinks like Gatorade (No red) Lightly seasoned clear broth or consume(fat free) Sugar      The day of surgery:  Drink ONE (1) Pre-Surgery G2 by 7:45 am the morning of surgery. Drink in one sitting. Do not sip.  This drink was given to you during your hospital  pre-op appointment visit. Nothing else to drink after completing the  Pre-Surgery G2.          If you have questions, please contact your surgeon's office.     Oral Hygiene is also important to reduce your risk of infection.                                    Remember - BRUSH YOUR TEETH THE MORNING OF SURGERY WITH YOUR REGULAR TOOTHPASTE   Take these medicines the morning of surgery with A SIP OF WATER: Inhalers, Coreg, Protonix  DO NOT TAKE ANY ORAL DIABETIC MEDICATIONS DAY OF YOUR SURGERY  How to Manage Your Diabetes Before and After Surgery  Why is it important to control my blood sugar before and after surgery? Improving blood sugar levels before and after surgery helps healing and can limit problems. A way of improving blood sugar control is eating a healthy diet by:  Eating less sugar and carbohydrates  Increasing activity/exercise  Talking with your doctor about reaching your blood sugar goals High blood sugars (greater than 180 mg/dL) can raise your risk of infections and slow your recovery, so you will need to focus on controlling your diabetes during the weeks before surgery. Make sure that the doctor who takes care of your diabetes knows about your planned surgery including the date and location.  How do I manage my blood sugar before surgery? Check your blood sugar at least 4 times a day, starting 2 days before surgery, to make sure that the level is not too high or low. Check your blood sugar the morning of your surgery when you  wake up and every 2 hours until you get to the Short Stay unit. If your blood sugar is less than 70 mg/dL, you will need to treat for low blood sugar: Do not take insulin. Treat a low blood sugar (less than 70 mg/dL) with  cup of clear juice (cranberry or apple), 4 glucose tablets, OR glucose gel. Recheck blood sugar in 15 minutes after treatment (to make sure it is greater than 70 mg/dL). If your blood sugar is not greater than 70 mg/dL on recheck, call [phone] for further instructions. Report your blood sugar to the short stay nurse when you get to Short Stay.  If you are admitted to the hospital after surgery: Your blood sugar will be checked by the staff and you will probably be given insulin after surgery (instead of oral diabetes medicines) to make sure you have good blood sugar levels. The goal for blood sugar control after surgery is 80-180 mg/dL.   WHAT DO I DO ABOUT MY DIABETES MEDICATION?  Do not take oral diabetes medicines (pills) the morning of surgery.  THE DAY BEFORE SURGERY, take Metformin as prescribed the day before surgery.       THE MORNING OF SURGERY, do not take Metformin.   Reviewed and Endorsed by [facility] Patient Education Committee, August 2015  You may not have any metal on your body including jewelry, and body piercing             Do not wear lotions, powders, cologne, or deodorant              Men may shave face and neck.   Do not bring valuables to the hospital. [place].   Bring small overnight bag day of surgery.    Special Instructions: Bring a copy of your healthcare power of attorney and living will documents         the day of surgery if you haven't scanned them before.              Please read over the following fact sheets you were given: IF YOU HAVE QUESTIONS ABOUT YOUR PRE-OP INSTRUCTIONS PLEASE CALL [facility] - Preparing for  Surgery Before surgery, you can play an important role.  Because skin is not sterile, your skin needs to be as free of germs as possible.  You can reduce the number of germs on your skin by washing with CHG (chlorahexidine gluconate) soap before surgery.  CHG is an antiseptic cleaner which kills germs and bonds with the skin to continue killing germs even after washing. Please DO NOT use if you have an allergy to CHG or antibacterial soaps.  If your skin becomes reddened/irritated stop using the CHG and inform your nurse when you arrive at Short Stay. Do not shave (including legs and underarms) for at least 48 hours prior to the first CHG shower.  You may shave your face/neck.  Please follow these instructions carefully:  1.  Shower with CHG Soap the night before surgery and the  morning of surgery.  2.  If you choose to wash your hair, wash your hair first as usual with your normal  shampoo.  3.  After you shampoo, rinse your hair and body thoroughly to remove the shampoo.                             4.  Use CHG as you would any other liquid soap.  You can apply chg directly to the skin and wash.  Gently with a scrungie or clean washcloth.  5.  Apply the CHG Soap to your body ONLY FROM THE NECK DOWN.   Do   not use on face/ open                           Wound or open sores. Avoid contact with eyes, ears mouth and   genitals (private parts).                       Wash face,  Genitals (private parts) with your normal soap.             6.  Wash thoroughly, paying special attention to the area where your    surgery  will be performed.  7.  Thoroughly rinse your body with warm water from the neck down.  8.  DO NOT shower/wash with your normal soap after using and rinsing off the CHG Soap.                9.  Pat yourself dry with a clean  towel.            10.  Wear clean pajamas.            11.  Place clean sheets on your bed the night of your first shower and do not  sleep with pets. Day of Surgery  : Do not apply any lotions/deodorants the morning of surgery.  Please wear clean clothes to the hospital/surgery center.  FAILURE TO FOLLOW THESE INSTRUCTIONS MAY RESULT IN THE CANCELLATION OF YOUR SURGERY  PATIENT SIGNATURE_________________________________  NURSE SIGNATURE__________________________________  ________________________________________________________________________   Daniel [name]  An incentive spirometer is a tool that can help keep your lungs clear and active. This tool measures how well you are filling your lungs with each breath. Taking long deep breaths may help reverse or decrease the chance of developing breathing (pulmonary) problems (especially infection) following: A long period of time when you are unable to move or be active. BEFORE THE PROCEDURE  If the spirometer includes an indicator to show your best effort, your nurse or respiratory therapist will set it to a desired goal. If possible, sit up straight or lean slightly forward. Try not to slouch. Hold the incentive spirometer in an upright position. INSTRUCTIONS FOR USE  Sit on the edge of your bed if possible, or sit up as far as you can in bed or on a chair. Hold the incentive spirometer in an upright position. Breathe out normally. Place the mouthpiece in your mouth and seal your lips tightly around it. Breathe in slowly and as deeply as possible, raising the piston or the ball toward the top of the column. Hold your breath for 3-5 seconds or for as long as possible. Allow the piston or ball to fall to the bottom of the column. Remove the mouthpiece from your mouth and breathe out normally. Rest for a few seconds and repeat Steps 1 through 7 at least 10 times every 1-2 hours when you are awake. Take your time and take a few normal breaths between deep breaths. The spirometer may include an indicator to show your best effort. Use the indicator as a goal to work toward during each repetition. After  each set of 10 deep breaths, practice coughing to be sure your lungs are clear. If you have an incision (the cut made at the time of surgery), support your incision when coughing by placing a pillow or rolled up towels firmly against it. Once you are able to get out of bed, walk around indoors and cough well. You may stop using the incentive spirometer when instructed by your caregiver.  RISKS AND COMPLICATIONS Take your time so you do not get dizzy or light-headed. If you are in pain, you may need to take or ask for pain medication before doing incentive spirometry. It is harder to take a deep breath if you are having pain. AFTER USE Rest and breathe slowly and easily. It can be helpful to keep track of a log of your progress. Your caregiver can provide you with a simple table to help with this. If you are using the spirometer at home, follow these instructions: [place] IF:  You are having difficultly using the spirometer. You have trouble using the spirometer as often as instructed. Your pain medication is not giving enough relief while using the spirometer. You develop fever of 100.5 F (38.1 C) or higher. SEEK IMMEDIATE MEDICAL CARE IF:  You cough up bloody sputum that had not been present before. You develop fever  of 102 F (38.9 C) or greater. You develop worsening pain at or near the incision site. MAKE SURE YOU:  Understand these instructions. Will watch your condition. Will get help right away if you are not doing well or get worse. Document Released: 04/15/2007 Document Revised: 02/25/2012 Document Reviewed: 06/16/2007 Exit[employer] Patient Information 2014 [employer], [place].   ________________________________________________________________________  WHAT IS A BLOOD TRANSFUSION? Blood Transfusion Information  A transfusion is the replacement of blood or some of its parts. Blood is made up of multiple cells which provide different functions. Red blood cells carry oxygen  and are used for blood loss replacement. White blood cells fight against infection. Platelets control bleeding. Plasma helps clot blood. Other blood products are available for specialized needs, such as hemophilia or other clotting disorders. BEFORE THE TRANSFUSION  Who gives blood for transfusions?  Healthy volunteers who are fully evaluated to make sure their blood is safe. This is blood bank blood. Transfusion therapy is the safest it has ever been in the practice of medicine. Before blood is taken from a donor, a complete history is taken to make sure that person has no history of diseases nor engages in risky social behavior (examples are intravenous drug use or sexual activity with multiple partners). The donor's travel history is screened to minimize risk of transmitting infections, such as malaria. The donated blood is tested for signs of infectious diseases, such as HIV and hepatitis. The blood is then tested to be sure it is compatible with you in order to minimize the chance of a transfusion reaction. If you or a relative donates blood, this is often done in anticipation of surgery and is not appropriate for emergency situations. It takes many days to process the donated blood. RISKS AND COMPLICATIONS Although transfusion therapy is very safe and saves many lives, the main dangers of transfusion include:  Getting an infectious disease. Developing a transfusion reaction. This is an allergic reaction to something in the blood you were given. Every precaution is taken to prevent this. The decision to have a blood transfusion has been considered carefully by your caregiver before blood is given. Blood is not given unless the benefits outweigh the risks. AFTER THE TRANSFUSION Right after receiving a blood transfusion, you will usually feel much better and more energetic. This is especially true if your red blood cells have gotten low (anemic). The transfusion raises the level of the red blood  cells which carry oxygen, and this usually causes an energy increase. The nurse administering the transfusion will monitor you carefully for complications. HOME CARE INSTRUCTIONS  No special instructions are needed after a transfusion. You may find your energy is better. Speak with your caregiver about any limitations on activity for underlying diseases you may have. SEEK MEDICAL CARE IF:  Your condition is not improving after your transfusion. You develop redness or irritation at the intravenous (IV) site. SEEK IMMEDIATE MEDICAL CARE IF:  Any of the following symptoms occur over the next 12 hours: Shaking chills. You have a temperature by mouth above 102 F (38.9 C), not controlled by medicine. Chest, back, or muscle pain. People around you feel you are not acting correctly or are confused. Shortness of breath or difficulty breathing. Dizziness and fainting. You get a rash or develop hives. You have a decrease in urine output. Your urine turns a dark color or changes to pink, red, or brown. Any of the following symptoms occur over the next 10 days: You have a temperature by mouth above  102 F (38.9 C), not controlled by medicine. Shortness of breath. Weakness after normal activity. The white part of the eye turns yellow (jaundice). You have a decrease in the amount of urine or are urinating less often. Your urine turns a dark color or changes to pink, red, or brown. Document Released: 11/30/2000 Document Revised: 02/25/2012 Document Reviewed: 07/19/2008 [facility] Patient Information 2014 [place], [place].  _______________________________________________________________________

## 2021-11-01 NOTE — Progress Notes (Addendum)
COVID swab appointment: 11/13/21  COVID Vaccine Completed: yes x4 Date COVID Vaccine completed: 01/18/20, 02/15/20 Has received booster: 10/13/20, 04/19/21 COVID vaccine manufacturer: [employer]   Date of [place] positive in last 90 days: no  PCP - Kathlene November, MD Cardiologist - [place] clearance by Kathlene November 10/27/21 on chart  Chest x-ray - n/a EKG - 11/02/21 Epic/chart Stress Test - 2018 ECHO - n/a Cardiac Cath - n/a Pacemaker/ICD device last checked:n/a Spinal Cord Stimulator: n/a  Sleep Study - n/a CPAP -   Fasting Blood Sugar - 60-180 Checks Blood Sugar _1_ times a day  Blood Thinner Instructions: Aspirin Instructions: ASA 81, hold 1 day Last Dose:  Activity level: Can go up a flight of stairs and perform activities of daily living without stopping and without symptoms of chest pain or shortness of breath.     Anesthesia review: HTN, DM, asthma  Patient denies shortness of breath, fever, cough and chest pain at PAT appointment   Patient verbalized understanding of instructions that were given to them at the PAT appointment. Patient was also instructed that they will need to review over the PAT instructions again at home before surgery.

## 2021-11-02 ENCOUNTER — Encounter (HOSPITAL_COMMUNITY): Payer: Self-pay

## 2021-11-02 ENCOUNTER — Other Ambulatory Visit: Payer: Self-pay

## 2021-11-02 ENCOUNTER — Encounter (HOSPITAL_COMMUNITY)
Admission: RE | Admit: 2021-11-02 | Discharge: 2021-11-02 | Disposition: A | Discharge status: Home / Self Care | Payer: No Typology Code available for payment source | Source: Ambulatory Visit | Attending: Orthopedic Surgery | Admitting: Orthopedic Surgery

## 2021-11-02 VITALS — BP 133/96 | HR 66 | Temp 98.2°F | Resp 16 | Ht 70.5 in | Wt 211.2 lb

## 2021-11-02 DIAGNOSIS — M1732 Unilateral post-traumatic osteoarthritis, left knee: Secondary | ICD-10-CM | POA: Insufficient documentation

## 2021-11-02 DIAGNOSIS — Z01818 Encounter for other preprocedural examination: Secondary | ICD-10-CM | POA: Diagnosis not present

## 2021-11-02 DIAGNOSIS — E118 Type 2 diabetes mellitus with unspecified complications: Secondary | ICD-10-CM | POA: Insufficient documentation

## 2021-11-02 LAB — COMPREHENSIVE METABOLIC PANEL
ALT: 22 U/L (ref 0–44)
AST: 21 U/L (ref 15–41)
Albumin: 4.2 g/dL (ref 3.5–5.0)
Alkaline Phosphatase: 72 U/L (ref 38–126)
Anion gap: 10 (ref 5–15)
BUN: 20 mg/dL (ref 6–20)
CO2: 24 mmol/L (ref 22–32)
Calcium: 9 mg/dL (ref 8.9–10.3)
Chloride: 104 mmol/L (ref 98–111)
Creatinine, Ser: 1.31 mg/dL — ABNORMAL HIGH (ref 0.61–1.24)
GFR, Estimated: >60 mL/min (ref >60)
Glucose, Bld: 109 mg/dL — ABNORMAL HIGH (ref 70–99)
Potassium: 3.9 mmol/L (ref 3.5–5.1)
Sodium: 138 mmol/L (ref 135–145)
Total Bilirubin: 1.8 mg/dL — ABNORMAL HIGH (ref 0.3–1.2)
Total Protein: 7 g/dL (ref 6.5–8.1)

## 2021-11-02 LAB — HEMOGLOBIN A1C
Hgb A1c MFr Bld: 4.9 % (ref 4.8–5.6)
Mean Plasma Glucose: 93.93 mg/dL

## 2021-11-02 LAB — CBC
HCT: 40.8 % (ref 39.0–52.0)
Hemoglobin: 14.8 g/dL (ref 13.0–17.0)
MCH: 31.4 pg (ref 26.0–34.0)
MCHC: 36.3 g/dL — ABNORMAL HIGH (ref 30.0–36.0)
MCV: 86.4 fL (ref 80.0–100.0)
Platelets: 134 10*3/uL — ABNORMAL LOW (ref 150–400)
RBC: 4.72 MIL/uL (ref 4.22–5.81)
RDW: 13.4 % (ref 11.5–15.5)
WBC: 6.1 10*3/uL (ref 4.0–10.5)
nRBC: 0 % (ref 0.0–0.2)

## 2021-11-02 LAB — SURGICAL PCR SCREEN
MRSA, PCR: NEGATIVE
Staphylococcus aureus: NEGATIVE

## 2021-11-02 LAB — GLUCOSE, CAPILLARY: Glucose-Capillary: 98 mg/dL (ref 70–99)

## 2021-11-02 LAB — PROTIME-INR
INR: 1 (ref 0.8–1.2)
Prothrombin Time: 12.8 seconds (ref 11.4–15.2)

## 2021-11-02 LAB — TYPE AND SCREEN
ABO/RH(D): O POS
Antibody Screen: NEGATIVE

## 2021-11-13 ENCOUNTER — Other Ambulatory Visit: Payer: Self-pay | Admitting: Orthopedic Surgery

## 2021-11-13 LAB — SARS CORONAVIRUS 2 (TAT 6-24 HRS): SARS Coronavirus 2: NEGATIVE

## 2021-11-14 NOTE — H&P (Signed)
TOTAL KNEE REVISION ADMISSION H&P  Patient is being admitted for left total knee arthroplasty revision - left TKA tibial component revision vs. Complete TKA revision  Subjective:  Chief Complaint: Left knee pain.  HPI: Daniel [name] is a pleasant 55 year old male who comes to the clinic for follow-up status post left total knee arthroplasty. He indicates his symptoms are unchanged from his last visit. He notes he continues to experience pain in his knee.   The patient indicates his pain is significantly worse today. He notes his pain is almost unbearable some days. He states he has pain with weight-bearing and shifting in any direction.   The patient indicates he had a bone scan done at [facility].  We discussed the patient's presenting complaints, history, and treatment options. He has a loose tibial component causing this pain. At this point, it has been positive on 2 bone scans and he has had an infection workup with labs, which was negative. I would recommend proceeding with a tibial versus total knee revision. We discussed that in detail and he would like to proceed. We will need workers' Economist. Information is given to [place] for scheduling.  Patient Active Problem List   Diagnosis Date Noted   OA (osteoarthritis) of knee 03/07/2020   Post-traumatic osteoarthritis of one knee, left 03/07/2020   Chronic pain after traumatic injury 01/28/2020   Post traumatic stress disorder (PTSD) 01/28/2020   Cervical spondylosis with myelopathy 08/21/2019   Wedge compression fracture of t5-T6 vertebra 08/21/2019   Crush accident 02/10/2019   Anxiety and depression 02/24/2017   HSV infection 02/24/2017   H/O Clostridium difficile infection    PCP NOTES >>>>> 09/12/2015   Annual physical exam 05/02/2011   DM II (diabetes mellitus, type II), controlled ([place]) 03/05/2011   GERD 01/23/2011   Hyperlipidemia 04/28/2008   Asthma 08/26/2007   Essential hypertension 02/09/2007    INSOMNIA 02/09/2007    Past Medical History:  Diagnosis Date   Asthma    Depression    h/o   Diabetes mellitus    GERD (gastroesophageal reflux disease)    H/O Clostridium difficile infection 08/2015   Hyperlipidemia    Hypertension    Insomnia    Polyarthralgia 2009   blood work (-) CKs slightly elevated, bone san (-) saw rheumatology; continue w/ somptoms after holding zocor    Past Surgical History:  Procedure Laterality Date   CERVICAL SPINE SURGERY  05/31/2019   operated on C4-C5   HAND SURGERY Left 2006   HEMORRHOID SURGERY     HERNIA REPAIR  34/1937   umbilical   KNEE SURGERY Right 2011    NASAL FRACTURE SURGERY  2006   TOTAL KNEE ARTHROPLASTY Left 03/07/2020   Procedure: TOTAL KNEE ARTHROPLASTY;  Surgeon: Gaynelle Arabian, MD;  Location: [facility];  Service: Orthopedics;  Laterality: Left;  92min    Prior to Admission medications   Medication Sig Start Date End Date Taking? Authorizing Provider  albuterol (VENTOLIN HFA) 108 (90 Base) MCG/ACT inhaler Inhale 1-2 puffs into the lungs every 6 (six) hours as needed for wheezing or shortness of breath. 11/24/20  Yes Colon Branch, MD  aspirin EC 81 MG tablet Take 81 mg by mouth daily. Swallow whole.   Yes [provider]  atorvastatin (LIPITOR) 20 MG tablet Take 1 tablet (20 mg total) by mouth at bedtime. 03/23/21  Yes Paz, Alda Berthold, MD  carvedilol (COREG) 12.5 MG tablet TAKE 1 TABLET(12.5 MG) BY MOUTH TWICE DAILY WITH A MEAL 10/09/21  Yes Paz, Alda Berthold, MD  cyclobenzaprine (FLEXERIL) 10 MG tablet Take 1 tablet (10 mg total) by mouth 3 (three) times daily as needed for muscle spasms. 03/08/20  Yes Edmisten, Kristie L, PA  fish oil-omega-3 fatty acids 1000 MG capsule Take 1,000 mg by mouth daily.   Yes [provider]  losartan (COZAAR) 100 MG tablet TAKE 1 TABLET(100 MG) BY MOUTH DAILY 08/08/21  Yes Colon Branch, MD  metFORMIN (GLUCOPHAGE) 850 MG tablet TAKE 1 TABLET(850 MG) BY MOUTH TWICE DAILY WITH A MEAL 10/09/21  Yes  Paz, Alda Berthold, MD  Multiple Vitamin (MULTIVITAMIN WITH MINERALS) TABS tablet Take 1 tablet by mouth daily.   Yes [provider]  oxyCODONE (OXY IR/ROXICODONE) 5 MG immediate release tablet Take 1-3 tablets (5-15 mg total) by mouth every 6 (six) hours as needed for severe pain. Patient taking differently: Take 5 mg by mouth every 6 (six) hours as needed for severe pain. 03/08/20  Yes Edmisten, Kristie L, PA  pantoprazole (PROTONIX) 40 MG tablet Take 1 tablet (40 mg total) by mouth 2 (two) times daily before a meal. 03/21/21  Yes Winter, MD  Probiotic Product (PROBIOTIC DAILY PO) Take 1 tablet by mouth daily.    Yes [provider]  Red Yeast Rice 600 MG CAPS Take 600 mg by mouth daily.   Yes [provider]  tadalafil (CIALIS) 20 MG tablet Take 20 mg by mouth daily as needed for erectile dysfunction. 02/12/20  Yes [provider]  valACYclovir (VALTREX) 500 MG tablet Take 1 tablet (500 mg total) by mouth daily. Patient taking differently: Take 500 mg by mouth daily as needed (outbreak). 03/21/21  Yes Paz, Alda Berthold, MD  zolpidem (AMBIEN CR) 12.5 MG CR tablet TAKE 1 TABLET(12.5 MG) BY MOUTH AT BEDTIME AS NEEDED FOR SLEEP Patient taking differently: Take 12.5 mg by mouth at bedtime. 10/09/21  Yes Paz, Jacqulyn Bath E, MD  glucose blood [facility] ULTRA) test strip TEST BLOOD SUGAR ONCE DAILY 02/20/21   Colon Branch, MD  hydrocortisone (ANUSOL-HC) 25 MG suppository Place 1 suppository (25 mg total) rectally daily as needed for hemorrhoids. Patient not taking: No sig reported 04/27/20   Colon Branch, MD  hydrocortisone 2.5 % cream Apply topically in the morning and at bedtime. Patient not taking: No sig reported 12/30/20   Colon Branch, MD  OneTouch Delica Lancets 05L MISC Check blood sugars once daily 01/29/20   Colon Branch, MD  tiZANidine (ZANAFLEX) 4 MG tablet Take 4 mg by mouth every 6 (six) hours as needed for muscle spasms. 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  Vaping Use   Vaping Use: Never used  Substance and Sexual Activity   Alcohol use: Yes    Alcohol/week: 5.0 standard drinks    Types: 5 Cans of beer per week    Comment:  weekend   Drug use: No   Sexual activity: Not on file  Other Topics Concern   Not on file  Social History Narrative   Married, 1 adopted child   Household:pt and wife             Social Determinants of Health   Financial Resource Strain: Not on file  Food Insecurity: Not on file  Transportation Needs: Not on file  Physical Activity: Not on file  Stress: Not on file  Social Connections: Not on file  Intimate Partner Violence: Not on file    Tobacco Use: Low Risk    Smoking Tobacco Use: Never   Smokeless Tobacco Use: Never   Passive Exposure: Not on file   Social History   Substance and Sexual Activity  Alcohol Use Yes   Alcohol/week: 5.0 standard drinks   Types: 5 Cans of beer per week   Comment: weekend    Family History  Problem Relation Age of Onset   Diabetes Mother        ??   Hypertension Mother    Diabetes Father    Prostate cancer Neg Hx    Colon cancer Neg Hx    Coronary artery disease Neg Hx    Stroke Neg Hx     ROS: Constitutional: no fever, no chills, no night sweats, no significant weight loss Cardiovascular: no chest pain, no palpitations Respiratory: no cough, no shortness of breath, No COPD Gastrointestinal: no vomiting, no  nausea Musculoskeletal: no swelling in Joints, Joint Pain Neurologic: no numbness, no tingling, no difficulty with balance   Objective:  Physical Exam: Well nourished and well developed.  General: Alert and oriented x3, cooperative and pleasant, no acute distress.  Head: normocephalic, atraumatic, neck supple.  Eyes: EOMI.  Respiratory: breath sounds clear in all fields, no wheezing, rales, or rhonchi. Cardiovascular: Regular rate and rhythm, no murmurs, gallops or rubs.  Abdomen: non-tender to palpation and soft, normoactive bowel sounds. Musculoskeletal:   Left knee exam:   There is no effusion.   Significant tenderness on the proximal tibia.   No tenderness along the femoral side.   The range of motion: 0 to 105 degrees.   The knee is stable.  Calves soft and nontender. Motor function intact in LE. Strength 5/5 LE bilaterally. Neuro: Distal pulses 2+. Sensation to light touch intact in LE.  Vital signs in last 24 hours:    Imaging Review Two bone scans obtained to this point indicate tibial loosening with no signs of infection.   Assessment/Plan:  Failed TKA, Left Knee  The patient history, physical examination, clinical judgment of the provider and imaging studies are consistent with failed total knee arthroplasty of left knee and total knee arthroplasty revision is deemed medically necessary. The risks and benefits of total knee arthroplasty were presented and reviewed. The risks due to aseptic loosening, infection, stiffness, patella tracking problems, thromboembolic complications and other imponderables were discussed. The patient acknowledged the explanation, agreed to proceed with the plan and consent was signed. Patient is being admitted for inpatient treatment for surgery, pain control, PT, OT, prophylactic antibiotics, VTE prophylaxis, progressive ambulation and ADLs and discharge planning. The patient is planning to be discharged  home .   Patient's anticipated  LOS is less than 2 midnights, meeting these requirements: - Younger than 17 - Lives within 1 hour of care - Has a competent adult at home to recover with post-op recover - NO history of  - Chronic pain requiring opiods  -  Diabetes  - Coronary Artery Disease  - Heart failure  - Heart attack  - Stroke  - DVT/VTE  - Cardiac arrhythmia  - Respiratory Failure/COPD  - Renal failure  - Anemia  - Advanced Liver disease    Therapy Plans: EO Disposition: Home with Wife Planned DVT Prophylaxis: Aspirin 325mg  DME Needed: RW PCP: Kathlene November, MD (clearance received) TXA: IV Allergies: Levaquin Anesthesia Concerns: Confusion following previous TKA BMI: 30.6 Last HgbA1c: 5.3  Pharmacy: [facility] [address]   - Patient was instructed on what medications to stop prior to surgery. - Follow-up visit in 2 weeks with Dr. Wynelle Link - Begin physical therapy following surgery - Pre-operative lab work as pre-surgical testing - Prescriptions will be provided in hospital at time of discharge  Fenton Foy, [facility], PA-C Orthopedic Surgery [facility] [place]

## 2021-11-15 ENCOUNTER — Inpatient Hospital Stay (HOSPITAL_COMMUNITY)
Admission: RE | Admit: 2021-11-15 | Discharge: 2021-11-16 | DRG: 467 | Disposition: A | Discharge status: Home / Self Care | Payer: No Typology Code available for payment source | Attending: Orthopedic Surgery | Admitting: Orthopedic Surgery

## 2021-11-15 ENCOUNTER — Ambulatory Visit (HOSPITAL_COMMUNITY): Payer: No Typology Code available for payment source | Admitting: Certified Registered"

## 2021-11-15 ENCOUNTER — Other Ambulatory Visit: Payer: Self-pay

## 2021-11-15 ENCOUNTER — Encounter (HOSPITAL_COMMUNITY): Payer: Self-pay | Admitting: Orthopedic Surgery

## 2021-11-15 ENCOUNTER — Encounter (HOSPITAL_COMMUNITY)
Admission: RE | Disposition: A | Discharge status: Home / Self Care | Payer: Self-pay | Source: Home / Self Care | Attending: Orthopedic Surgery

## 2021-11-15 ENCOUNTER — Ambulatory Visit (HOSPITAL_COMMUNITY): Payer: No Typology Code available for payment source | Admitting: Physician Assistant

## 2021-11-15 DIAGNOSIS — Z7722 Contact with and (suspected) exposure to environmental tobacco smoke (acute) (chronic): Secondary | ICD-10-CM | POA: Diagnosis present

## 2021-11-15 DIAGNOSIS — M62838 Other muscle spasm: Secondary | ICD-10-CM | POA: Diagnosis present

## 2021-11-15 DIAGNOSIS — F419 Anxiety disorder, unspecified: Secondary | ICD-10-CM | POA: Diagnosis present

## 2021-11-15 DIAGNOSIS — S22050A Wedge compression fracture of T5-T6 vertebra, initial encounter for closed fracture: Secondary | ICD-10-CM | POA: Diagnosis present

## 2021-11-15 DIAGNOSIS — Z96652 Presence of left artificial knee joint: Secondary | ICD-10-CM | POA: Diagnosis present

## 2021-11-15 DIAGNOSIS — T84093A Other mechanical complication of internal left knee prosthesis, initial encounter: Secondary | ICD-10-CM

## 2021-11-15 DIAGNOSIS — E785 Hyperlipidemia, unspecified: Secondary | ICD-10-CM | POA: Diagnosis present

## 2021-11-15 DIAGNOSIS — Y828 Other medical devices associated with adverse incidents: Secondary | ICD-10-CM | POA: Diagnosis present

## 2021-11-15 DIAGNOSIS — M1732 Unilateral post-traumatic osteoarthritis, left knee: Secondary | ICD-10-CM

## 2021-11-15 DIAGNOSIS — I10 Essential (primary) hypertension: Secondary | ICD-10-CM | POA: Diagnosis present

## 2021-11-15 DIAGNOSIS — T84018A Broken internal joint prosthesis, other site, initial encounter: Secondary | ICD-10-CM

## 2021-11-15 DIAGNOSIS — Z7984 Long term (current) use of oral hypoglycemic drugs: Secondary | ICD-10-CM

## 2021-11-15 DIAGNOSIS — J45909 Unspecified asthma, uncomplicated: Secondary | ICD-10-CM | POA: Diagnosis present

## 2021-11-15 DIAGNOSIS — Z20822 Contact with and (suspected) exposure to covid-19: Secondary | ICD-10-CM | POA: Diagnosis present

## 2021-11-15 DIAGNOSIS — Z7982 Long term (current) use of aspirin: Secondary | ICD-10-CM

## 2021-11-15 DIAGNOSIS — E119 Type 2 diabetes mellitus without complications: Secondary | ICD-10-CM | POA: Diagnosis present

## 2021-11-15 DIAGNOSIS — F32A Depression, unspecified: Secondary | ICD-10-CM | POA: Diagnosis present

## 2021-11-15 DIAGNOSIS — Z8249 Family history of ischemic heart disease and other diseases of the circulatory system: Secondary | ICD-10-CM | POA: Diagnosis not present

## 2021-11-15 DIAGNOSIS — F4310 Post-traumatic stress disorder, unspecified: Secondary | ICD-10-CM | POA: Diagnosis present

## 2021-11-15 DIAGNOSIS — K219 Gastro-esophageal reflux disease without esophagitis: Secondary | ICD-10-CM | POA: Diagnosis present

## 2021-11-15 DIAGNOSIS — T84033A Mechanical loosening of internal left knee prosthetic joint, initial encounter: Principal | ICD-10-CM | POA: Diagnosis present

## 2021-11-15 DIAGNOSIS — Y792 Prosthetic and other implants, materials and accessory orthopedic devices associated with adverse incidents: Secondary | ICD-10-CM | POA: Diagnosis present

## 2021-11-15 DIAGNOSIS — T8484XA Pain due to internal orthopedic prosthetic devices, implants and grafts, initial encounter: Secondary | ICD-10-CM | POA: Diagnosis present

## 2021-11-15 DIAGNOSIS — Z96659 Presence of unspecified artificial knee joint: Secondary | ICD-10-CM

## 2021-11-15 DIAGNOSIS — Z833 Family history of diabetes mellitus: Secondary | ICD-10-CM

## 2021-11-15 DIAGNOSIS — M1712 Unilateral primary osteoarthritis, left knee: Secondary | ICD-10-CM | POA: Diagnosis present

## 2021-11-15 DIAGNOSIS — Z79899 Other long term (current) drug therapy: Secondary | ICD-10-CM

## 2021-11-15 HISTORY — PX: TOTAL KNEE REVISION: SHX996

## 2021-11-15 LAB — GLUCOSE, CAPILLARY
Glucose-Capillary: 94 mg/dL (ref 70–99)
Glucose-Capillary: 103 mg/dL — ABNORMAL HIGH (ref 70–99)
Glucose-Capillary: 125 mg/dL — ABNORMAL HIGH (ref 70–99)
Glucose-Capillary: 147 mg/dL — ABNORMAL HIGH (ref 70–99)
Glucose-Capillary: 233 mg/dL — ABNORMAL HIGH (ref 70–99)

## 2021-11-15 SURGERY — TOTAL KNEE REVISION
Anesthesia: Spinal | Site: Knee | Laterality: Left

## 2021-11-15 MED ORDER — ONDANSETRON HCL 4 MG/2ML IJ SOLN
INTRAMUSCULAR | Status: AC
  Filled 2021-11-15: qty 2

## 2021-11-15 MED ORDER — PROPOFOL 10 MG/ML IV BOLUS
INTRAVENOUS | Status: DC | PRN
  Administered 2021-11-15 (×3): 20 mg via INTRAVENOUS

## 2021-11-15 MED ORDER — ZOLPIDEM TARTRATE 5 MG PO TABS
5.0000 mg | ORAL_TABLET | Freq: Every evening | ORAL | Status: DC | PRN
  Administered 2021-11-15 – 2021-11-16 (×2): 5 mg via ORAL
  Filled 2021-11-15 (×2): qty 1

## 2021-11-15 MED ORDER — CEFAZOLIN SODIUM-DEXTROSE 2-4 GM/100ML-% IV SOLN
2.0000 g | Freq: Four times a day (QID) | INTRAVENOUS | Status: AC
  Administered 2021-11-15 (×2): 2 g via INTRAVENOUS
  Filled 2021-11-15 (×2): qty 100

## 2021-11-15 MED ORDER — HYDROMORPHONE HCL 1 MG/ML IJ SOLN: 0.2500 mg | INTRAMUSCULAR | Status: DC | PRN

## 2021-11-15 MED ORDER — TRAMADOL HCL 50 MG PO TABS
50.0000 mg | ORAL_TABLET | Freq: Four times a day (QID) | ORAL | Status: DC | PRN
  Administered 2021-11-16 (×2): 100 mg via ORAL
  Filled 2021-11-15 (×2): qty 2

## 2021-11-15 MED ORDER — OXYCODONE HCL 5 MG PO TABS: 5.0000 mg | ORAL_TABLET | Freq: Once | ORAL | Status: DC | PRN

## 2021-11-15 MED ORDER — BUPIVACAINE LIPOSOME 1.3 % IJ SUSP: 20.0000 mL | Freq: Once | INTRAMUSCULAR | Status: DC

## 2021-11-15 MED ORDER — OXYCODONE HCL 5 MG PO TABS
10.0000 mg | ORAL_TABLET | ORAL | Status: DC | PRN
  Administered 2021-11-15 – 2021-11-16 (×2): 15 mg via ORAL
  Filled 2021-11-15 (×2): qty 3

## 2021-11-15 MED ORDER — SODIUM CHLORIDE 0.9 % IR SOLN
Status: DC | PRN
  Administered 2021-11-15: 3000 mL

## 2021-11-15 MED ORDER — ASPIRIN EC 325 MG PO TBEC
325.0000 mg | DELAYED_RELEASE_TABLET | Freq: Two times a day (BID) | ORAL | Status: DC
  Administered 2021-11-16: 325 mg via ORAL
  Filled 2021-11-15: qty 1

## 2021-11-15 MED ORDER — ONDANSETRON HCL 4 MG PO TABS: 4.0000 mg | ORAL_TABLET | Freq: Four times a day (QID) | ORAL | Status: DC | PRN

## 2021-11-15 MED ORDER — SODIUM CHLORIDE (PF) 0.9 % IJ SOLN
INTRAMUSCULAR | Status: AC
  Filled 2021-11-15: qty 10

## 2021-11-15 MED ORDER — DOCUSATE SODIUM 100 MG PO CAPS
100.0000 mg | ORAL_CAPSULE | Freq: Two times a day (BID) | ORAL | Status: DC
  Administered 2021-11-15 – 2021-11-16 (×2): 100 mg via ORAL
  Filled 2021-11-15 (×2): qty 1

## 2021-11-15 MED ORDER — MIDAZOLAM HCL 5 MG/5ML IJ SOLN
INTRAMUSCULAR | Status: DC | PRN
  Administered 2021-11-15: 2 mg via INTRAVENOUS

## 2021-11-15 MED ORDER — PROPOFOL 500 MG/50ML IV EMUL
INTRAVENOUS | Status: DC | PRN
  Administered 2021-11-15: 100 ug/kg/min via INTRAVENOUS

## 2021-11-15 MED ORDER — POLYETHYLENE GLYCOL 3350 17 G PO PACK: 17.0000 g | PACK | Freq: Every day | ORAL | Status: DC | PRN

## 2021-11-15 MED ORDER — METHOCARBAMOL 500 MG IVPB - SIMPLE MED
INTRAVENOUS | Status: AC
  Filled 2021-11-15: qty 50

## 2021-11-15 MED ORDER — PHENOL 1.4 % MT LIQD: 1.0000 | OROMUCOSAL | Status: DC | PRN

## 2021-11-15 MED ORDER — OXYCODONE HCL 5 MG PO TABS
5.0000 mg | ORAL_TABLET | ORAL | Status: DC | PRN
  Administered 2021-11-15 – 2021-11-16 (×2): 10 mg via ORAL
  Filled 2021-11-15: qty 2

## 2021-11-15 MED ORDER — MORPHINE SULFATE (PF) 2 MG/ML IV SOLN
0.5000 mg | INTRAVENOUS | Status: DC | PRN
  Administered 2021-11-15: 1 mg via INTRAVENOUS
  Filled 2021-11-15: qty 1

## 2021-11-15 MED ORDER — CEFAZOLIN SODIUM-DEXTROSE 2-4 GM/100ML-% IV SOLN
2.0000 g | INTRAVENOUS | Status: AC
  Administered 2021-11-15: 2 g via INTRAVENOUS
  Filled 2021-11-15: qty 100

## 2021-11-15 MED ORDER — INSULIN ASPART 100 UNIT/ML IJ SOLN
0.0000 [IU] | Freq: Every day | INTRAMUSCULAR | Status: DC
  Administered 2021-11-15: 2 [IU] via SUBCUTANEOUS

## 2021-11-15 MED ORDER — ONDANSETRON HCL 4 MG/2ML IJ SOLN: 4.0000 mg | Freq: Four times a day (QID) | INTRAMUSCULAR | Status: DC | PRN

## 2021-11-15 MED ORDER — MENTHOL 3 MG MT LOZG: 1.0000 | LOZENGE | OROMUCOSAL | Status: DC | PRN

## 2021-11-15 MED ORDER — OXYCODONE HCL 5 MG/5ML PO SOLN: 5.0000 mg | Freq: Once | ORAL | Status: DC | PRN

## 2021-11-15 MED ORDER — FENTANYL CITRATE PF 50 MCG/ML IJ SOSY
50.0000 ug | PREFILLED_SYRINGE | INTRAMUSCULAR | Status: AC
  Administered 2021-11-15: 50 ug via INTRAVENOUS
  Filled 2021-11-15: qty 2

## 2021-11-15 MED ORDER — PHENYLEPHRINE 40 MCG/ML (10ML) SYRINGE FOR IV PUSH (FOR BLOOD PRESSURE SUPPORT)
PREFILLED_SYRINGE | INTRAVENOUS | Status: AC
  Filled 2021-11-15: qty 10

## 2021-11-15 MED ORDER — ACETAMINOPHEN 10 MG/ML IV SOLN: 1000.0000 mg | Freq: Once | INTRAVENOUS | Status: DC | PRN

## 2021-11-15 MED ORDER — PROPOFOL 1000 MG/100ML IV EMUL
INTRAVENOUS | Status: AC
  Filled 2021-11-15: qty 100

## 2021-11-15 MED ORDER — DEXAMETHASONE SODIUM PHOSPHATE 10 MG/ML IJ SOLN
INTRAMUSCULAR | Status: AC
  Filled 2021-11-15: qty 1

## 2021-11-15 MED ORDER — METOCLOPRAMIDE HCL 5 MG/ML IJ SOLN: 5.0000 mg | Freq: Three times a day (TID) | INTRAMUSCULAR | Status: DC | PRN

## 2021-11-15 MED ORDER — STERILE WATER FOR IRRIGATION IR SOLN
Status: DC | PRN
  Administered 2021-11-15: 2000 mL

## 2021-11-15 MED ORDER — MIDAZOLAM HCL 2 MG/2ML IJ SOLN
INTRAMUSCULAR | Status: AC
  Filled 2021-11-15: qty 2

## 2021-11-15 MED ORDER — ALBUTEROL SULFATE (2.5 MG/3ML) 0.083% IN NEBU: 2.5000 mg | INHALATION_SOLUTION | Freq: Four times a day (QID) | RESPIRATORY_TRACT | Status: DC | PRN

## 2021-11-15 MED ORDER — PROPOFOL 10 MG/ML IV BOLUS
INTRAVENOUS | Status: AC
  Filled 2021-11-15: qty 20

## 2021-11-15 MED ORDER — MIDAZOLAM HCL 2 MG/2ML IJ SOLN
1.0000 mg | INTRAMUSCULAR | Status: AC
  Administered 2021-11-15: 2 mg via INTRAVENOUS
  Filled 2021-11-15: qty 2

## 2021-11-15 MED ORDER — SODIUM CHLORIDE (PF) 0.9 % IJ SOLN
INTRAMUSCULAR | Status: DC | PRN
  Administered 2021-11-15: 60 mL

## 2021-11-15 MED ORDER — METHOCARBAMOL 500 MG IVPB - SIMPLE MED
500.0000 mg | Freq: Four times a day (QID) | INTRAVENOUS | Status: DC | PRN
  Administered 2021-11-15: 500 mg via INTRAVENOUS
  Filled 2021-11-15: qty 50

## 2021-11-15 MED ORDER — CHLORHEXIDINE GLUCONATE 0.12 % MT SOLN
15.0000 mL | Freq: Once | OROMUCOSAL | Status: AC
  Administered 2021-11-15: 15 mL via OROMUCOSAL

## 2021-11-15 MED ORDER — PROMETHAZINE HCL 25 MG/ML IJ SOLN: 6.2500 mg | INTRAMUSCULAR | Status: DC | PRN

## 2021-11-15 MED ORDER — TRANEXAMIC ACID-NACL 1000-0.7 MG/100ML-% IV SOLN
1000.0000 mg | INTRAVENOUS | Status: AC
  Administered 2021-11-15: 1000 mg via INTRAVENOUS
  Filled 2021-11-15: qty 100

## 2021-11-15 MED ORDER — PHENYLEPHRINE 40 MCG/ML (10ML) SYRINGE FOR IV PUSH (FOR BLOOD PRESSURE SUPPORT)
PREFILLED_SYRINGE | INTRAVENOUS | Status: DC | PRN
  Administered 2021-11-15 (×3): 80 ug via INTRAVENOUS

## 2021-11-15 MED ORDER — OXYCODONE HCL 5 MG PO TABS
ORAL_TABLET | ORAL | Status: AC
  Filled 2021-11-15: qty 2

## 2021-11-15 MED ORDER — PANTOPRAZOLE SODIUM 40 MG PO TBEC
40.0000 mg | DELAYED_RELEASE_TABLET | Freq: Two times a day (BID) | ORAL | Status: DC
  Administered 2021-11-15 – 2021-11-16 (×2): 40 mg via ORAL
  Filled 2021-11-15 (×2): qty 1

## 2021-11-15 MED ORDER — METHOCARBAMOL 500 MG PO TABS
500.0000 mg | ORAL_TABLET | Freq: Four times a day (QID) | ORAL | Status: DC | PRN
  Administered 2021-11-16 (×2): 500 mg via ORAL
  Filled 2021-11-15 (×2): qty 1

## 2021-11-15 MED ORDER — HYDROMORPHONE HCL 1 MG/ML IJ SOLN
0.5000 mg | INTRAMUSCULAR | Status: DC | PRN
Start: 2021-11-15 — End: 2021-11-16
  Administered 2021-11-15 (×2): 1 mg via INTRAVENOUS
  Filled 2021-11-15 (×2): qty 1

## 2021-11-15 MED ORDER — ATORVASTATIN CALCIUM 20 MG PO TABS: 20.0000 mg | ORAL_TABLET | Freq: Every day | ORAL | Status: DC

## 2021-11-15 MED ORDER — DEXAMETHASONE SODIUM PHOSPHATE 10 MG/ML IJ SOLN
8.0000 mg | Freq: Once | INTRAMUSCULAR | Status: AC
  Administered 2021-11-15: 8 mg via INTRAVENOUS

## 2021-11-15 MED ORDER — METOCLOPRAMIDE HCL 5 MG PO TABS: 5.0000 mg | ORAL_TABLET | Freq: Three times a day (TID) | ORAL | Status: DC | PRN

## 2021-11-15 MED ORDER — CARVEDILOL 12.5 MG PO TABS
12.5000 mg | ORAL_TABLET | Freq: Two times a day (BID) | ORAL | Status: DC
  Administered 2021-11-15 – 2021-11-16 (×2): 12.5 mg via ORAL
  Filled 2021-11-15 (×2): qty 1

## 2021-11-15 MED ORDER — LOSARTAN POTASSIUM 50 MG PO TABS
100.0000 mg | ORAL_TABLET | Freq: Every day | ORAL | Status: DC
  Administered 2021-11-16: 100 mg via ORAL
  Filled 2021-11-15: qty 2

## 2021-11-15 MED ORDER — DIPHENHYDRAMINE HCL 12.5 MG/5ML PO ELIX: 12.5000 mg | ORAL_SOLUTION | ORAL | Status: DC | PRN

## 2021-11-15 MED ORDER — BUPIVACAINE IN DEXTROSE 0.75-8.25 % IT SOLN
INTRATHECAL | Status: DC | PRN
  Administered 2021-11-15: 1.8 mL via INTRATHECAL

## 2021-11-15 MED ORDER — LACTATED RINGERS IV SOLN: INTRAVENOUS | Status: DC

## 2021-11-15 MED ORDER — 0.9 % SODIUM CHLORIDE (POUR BTL) OPTIME
TOPICAL | Status: DC | PRN
  Administered 2021-11-15: 1000 mL

## 2021-11-15 MED ORDER — ACETAMINOPHEN 10 MG/ML IV SOLN
1000.0000 mg | Freq: Four times a day (QID) | INTRAVENOUS | Status: DC
  Administered 2021-11-15: 1000 mg via INTRAVENOUS
  Filled 2021-11-15: qty 100

## 2021-11-15 MED ORDER — DEXAMETHASONE SODIUM PHOSPHATE 10 MG/ML IJ SOLN
10.0000 mg | Freq: Once | INTRAMUSCULAR | Status: AC
  Administered 2021-11-16: 10 mg via INTRAVENOUS
  Filled 2021-11-15: qty 1

## 2021-11-15 MED ORDER — SODIUM CHLORIDE 0.9 % IV SOLN: INTRAVENOUS | Status: DC

## 2021-11-15 MED ORDER — BISACODYL 10 MG RE SUPP: 10.0000 mg | Freq: Every day | RECTAL | Status: DC | PRN

## 2021-11-15 MED ORDER — FLEET ENEMA 7-19 GM/118ML RE ENEM: 1.0000 | ENEMA | Freq: Once | RECTAL | Status: DC | PRN

## 2021-11-15 MED ORDER — ONDANSETRON HCL 4 MG/2ML IJ SOLN
INTRAMUSCULAR | Status: DC | PRN
  Administered 2021-11-15: 4 mg via INTRAVENOUS

## 2021-11-15 MED ORDER — POVIDONE-IODINE 10 % EX SWAB
2.0000 "application " | Freq: Once | CUTANEOUS | Status: AC
  Administered 2021-11-15: 2 via TOPICAL

## 2021-11-15 MED ORDER — ACETAMINOPHEN 500 MG PO TABS
1000.0000 mg | ORAL_TABLET | Freq: Four times a day (QID) | ORAL | Status: AC
  Administered 2021-11-15 – 2021-11-16 (×4): 1000 mg via ORAL
  Filled 2021-11-15 (×4): qty 2

## 2021-11-15 MED ORDER — INSULIN ASPART 100 UNIT/ML IJ SOLN
0.0000 [IU] | Freq: Three times a day (TID) | INTRAMUSCULAR | Status: DC
  Administered 2021-11-15: 2 [IU] via SUBCUTANEOUS
  Administered 2021-11-16: 3 [IU] via SUBCUTANEOUS

## 2021-11-15 MED ORDER — GABAPENTIN 300 MG PO CAPS
300.0000 mg | ORAL_CAPSULE | Freq: Three times a day (TID) | ORAL | Status: DC
  Administered 2021-11-15 – 2021-11-16 (×3): 300 mg via ORAL
  Filled 2021-11-15 (×3): qty 1

## 2021-11-15 MED ORDER — ORAL CARE MOUTH RINSE: 15.0000 mL | Freq: Once | OROMUCOSAL | Status: AC

## 2021-11-15 MED ORDER — BUPIVACAINE LIPOSOME 1.3 % IJ SUSP
INTRAMUSCULAR | Status: DC | PRN
  Administered 2021-11-15: 20 mL

## 2021-11-15 MED ORDER — BUPIVACAINE LIPOSOME 1.3 % IJ SUSP
INTRAMUSCULAR | Status: AC
  Filled 2021-11-15: qty 20

## 2021-11-15 MED ORDER — CYCLOBENZAPRINE HCL 10 MG PO TABS
10.0000 mg | ORAL_TABLET | Freq: Three times a day (TID) | ORAL | Status: DC | PRN
  Administered 2021-11-15 – 2021-11-16 (×2): 10 mg via ORAL
  Filled 2021-11-15 (×2): qty 1

## 2021-11-15 SURGICAL SUPPLY — 64 items
BAG COUNTER SPONGE SURGICOUNT (BAG) IMPLANT
BAG DECANTER FOR FLEXI CONT (MISCELLANEOUS) ×2 IMPLANT
BAG SPEC THK2 15X12 ZIP CLS (MISCELLANEOUS)
BAG SPNG CNTER NS LX DISP (BAG)
BAG ZIPLOCK 12X15 (MISCELLANEOUS) IMPLANT
BLADE SAG 18X100X1.27 (BLADE) ×2 IMPLANT
BLADE SAW SGTL 11.0X1.19X90.0M (BLADE) ×2 IMPLANT
BLADE SURG SZ10 CARB STEEL (BLADE) ×4 IMPLANT
BNDG ELASTIC 6X5.8 VLCR STR LF (GAUZE/BANDAGES/DRESSINGS) ×2 IMPLANT
BONE CEMENT GENTAMICIN (Cement) ×2 IMPLANT
BSPLAT TIB 8 CMNT REV ROT PLAT (Knees) ×1 IMPLANT
CEMENT BONE GENTAMICIN 40 (Cement) ×3 IMPLANT
CLOTH BEACON ORANGE TIMEOUT ST (SAFETY) ×2 IMPLANT
COVER SURGICAL LIGHT HANDLE (MISCELLANEOUS) ×2 IMPLANT
CUFF TOURN SGL QUICK 34 (TOURNIQUET CUFF) ×2
CUFF TRNQT CYL 34X4.125X (TOURNIQUET CUFF) ×1 IMPLANT
DECANTER SPIKE VIAL GLASS SM (MISCELLANEOUS) IMPLANT
DRAPE INCISE IOBAN 66X45 STRL (DRAPES) ×2 IMPLANT
DRAPE U-SHAPE 47X51 STRL (DRAPES) ×2 IMPLANT
DRESSING AQUACEL AG SP 3.5X10 (GAUZE/BANDAGES/DRESSINGS) IMPLANT
DRSG ADAPTIC 3X8 NADH LF (GAUZE/BANDAGES/DRESSINGS) ×2 IMPLANT
DRSG AQUACEL AG SP 3.5X10 (GAUZE/BANDAGES/DRESSINGS) ×2
DRSG PAD ABDOMINAL 8X10 ST (GAUZE/BANDAGES/DRESSINGS) ×2 IMPLANT
DURAPREP 26ML APPLICATOR (WOUND CARE) ×2 IMPLANT
ELECT REM PT RETURN 15FT ADLT (MISCELLANEOUS) ×2 IMPLANT
EVACUATOR 1/8 PVC DRAIN (DRAIN) ×2 IMPLANT
GAUZE SPONGE 4X4 12PLY STRL (GAUZE/BANDAGES/DRESSINGS) ×2 IMPLANT
GLOVE SRG 8 PF TXTR STRL LF DI (GLOVE) ×1 IMPLANT
GLOVE SURG ENC MOIS LTX SZ6.5 (GLOVE) ×4 IMPLANT
GLOVE SURG ENC MOIS LTX SZ8 (GLOVE) ×4 IMPLANT
GLOVE SURG UNDER POLY LF SZ7 (GLOVE) ×2 IMPLANT
GLOVE SURG UNDER POLY LF SZ8 (GLOVE) ×2
GLOVE SURG UNDER POLY LF SZ8.5 (GLOVE) IMPLANT
GOWN STRL REUS W/TWL LRG LVL3 (GOWN DISPOSABLE) ×4 IMPLANT
HANDPIECE INTERPULSE COAX TIP (DISPOSABLE) ×2
HOLDER FOLEY CATH W/STRAP (MISCELLANEOUS) IMPLANT
IMMOBILIZER KNEE 20 (SOFTGOODS) ×2
IMMOBILIZER KNEE 20 THIGH 36 (SOFTGOODS) ×1 IMPLANT
INSERT TIB ATTUNE RP SZ8X16 (Insert) ×1 IMPLANT
INSERT TIB CMT ATTUNE RP SZ8 (Knees) ×1 IMPLANT
KIT TURNOVER KIT A (KITS) IMPLANT
MANIFOLD NEPTUNE II (INSTRUMENTS) ×2 IMPLANT
NS IRRIG 1000ML POUR BTL (IV SOLUTION) ×2 IMPLANT
PACK TOTAL KNEE CUSTOM (KITS) ×2 IMPLANT
PADDING CAST COTTON 6X4 STRL (CAST SUPPLIES) ×3 IMPLANT
PROTECTOR NERVE ULNAR (MISCELLANEOUS) ×2 IMPLANT
SET HNDPC FAN SPRY TIP SCT (DISPOSABLE) ×1 IMPLANT
SLEEVE KNEE ATTUNE 29MM (Knees) ×1 IMPLANT
STEM STR ATTUNE PF 18X60 (Knees) ×1 IMPLANT
STRIP CLOSURE SKIN 1/2X4 (GAUZE/BANDAGES/DRESSINGS) ×3 IMPLANT
SUT MNCRL AB 4-0 PS2 18 (SUTURE) ×2 IMPLANT
SUT STRATAFIX 0 PDS 27 VIOLET (SUTURE) ×2
SUT VIC AB 2-0 CT1 27 (SUTURE) ×6
SUT VIC AB 2-0 CT1 TAPERPNT 27 (SUTURE) ×3 IMPLANT
SUTURE STRATFX 0 PDS 27 VIOLET (SUTURE) ×1 IMPLANT
SWAB COLLECTION DEVICE MRSA (MISCELLANEOUS) IMPLANT
SWAB CULTURE ESWAB REG 1ML (MISCELLANEOUS) IMPLANT
SYR 50ML LL SCALE MARK (SYRINGE) ×4 IMPLANT
TOWER CARTRIDGE SMART MIX (DISPOSABLE) ×2 IMPLANT
TRAY FOLEY MTR SLVR 16FR STAT (SET/KITS/TRAYS/PACK) ×2 IMPLANT
TUBE KAMVAC SUCTION (TUBING) IMPLANT
TUBE SUCTION HIGH CAP CLEAR NV (SUCTIONS) ×2 IMPLANT
WATER STERILE IRR 1000ML POUR (IV SOLUTION) ×2 IMPLANT
WRAP KNEE MAXI GEL POST OP (GAUZE/BANDAGES/DRESSINGS) ×1 IMPLANT

## 2021-11-15 NOTE — Plan of Care (Signed)
  Problem: Education: Goal: Knowledge of General Education information will improve Description: Including pain rating scale, medication(s)/side effects and non-pharmacologic comfort measures Outcome: Progressing   Problem: Activity: Goal: Risk for activity intolerance will decrease Outcome: Progressing   Problem: Nutrition: Goal: Adequate nutrition will be maintained Outcome: Progressing   Problem: Elimination: Goal: Will not experience complications related to bowel motility Outcome: Progressing   Problem: Pain Managment: Goal: General experience of comfort will improve Outcome: Progressing   Problem: Skin Integrity: Goal: Risk for impaired skin integrity will decrease Outcome: Progressing   Problem: Education: Goal: Knowledge of the prescribed therapeutic regimen will improve Outcome: Progressing   Problem: Activity: Goal: Ability to avoid complications of mobility impairment will improve Outcome: Progressing   Problem: Pain Management: Goal: Pain level will decrease with appropriate interventions Outcome: Progressing

## 2021-11-15 NOTE — Anesthesia Procedure Notes (Signed)
Anesthesia Regional Block: Adductor canal block   Pre-Anesthetic Checklist: , timeout performed,  Correct Patient, Correct Site, Correct Laterality,  Correct Procedure, Correct Position, site marked,  Risks and benefits discussed,  Surgical consent,  Pre-op evaluation,  At surgeon's request and post-op pain management  Laterality: Left  Prep: chloraprep       Needles:  Injection technique: Single-shot  Needle Type: Echogenic Needle     Needle Length: 9cm      Additional Needles:   Procedures:,,,, ultrasound used (permanent image in chart),,    Narrative:  Start time: 11/15/2021 9:22 AM End time: 11/15/2021 9:28 AM Injection made incrementally with aspirations every 5 mL.  Performed by: Personally  Anesthesiologist: Myrtie Soman, MD  Additional Notes: Patient tolerated the procedure well without complications

## 2021-11-15 NOTE — Transfer of Care (Signed)
Immediate Anesthesia Transfer of Care Note  Patient: Daniel [name]  Procedure(s) Performed: Left knee tibial versus total knee arthroplasty revision (Left: Knee)  Patient Location: PACU  Anesthesia Type:Regional and Spinal  Level of Consciousness: awake, alert  and oriented  Airway & Oxygen Therapy: Patient Spontanous Breathing and Patient connected to face mask oxygen  Post-op Assessment: Report given to RN and Post -op Vital signs reviewed and stable  Post vital signs: Reviewed and stable  Last Vitals:  Vitals Value Taken Time  BP 107/76 11/15/21 1200  Temp    Pulse 85 11/15/21 1201  Resp 14 11/15/21 1201  SpO2 100 % 11/15/21 1201  Vitals shown include unvalidated device data.  Last Pain:  Vitals:   11/15/21 0835  TempSrc:   PainSc: 5       Patients Stated Pain Goal: 4 (53/29/92 4268)  Complications: No notable events documented.

## 2021-11-15 NOTE — Progress Notes (Signed)
Peggy W. CRNA assisted Dr. Kalman Shan with adductor canal block. Side rails up, monitors on throughout procedure. See vital signs in flow sheet. Tolerated Procedure well.

## 2021-11-15 NOTE — Brief Op Note (Signed)
11/15/2021  11:30 AM  PATIENT:  Daniel [name]  55 y.o. male  PRE-OPERATIVE DIAGNOSIS:  aseptic loosening left total knee arthroplasty  POST-OPERATIVE DIAGNOSIS:  aseptic loosening left total knee arthroplasty  PROCEDURE:  Procedure(s): Left knee tibial versus total knee arthroplasty revision (Left)  SURGEON:  Surgeon(s) and Role:    Gaynelle Arabian, MD - Primary  PHYSICIAN ASSISTANT:   ASSISTANTS: Theresa Duty, PA-C   ANESTHESIA:   spinal and adductor canal block  EBL:  50 ml  BLOOD ADMINISTERED:none  DRAINS: none   LOCAL MEDICATIONS USED:  OTHER Exparel  COUNTS:  YES  TOURNIQUET:   Total Tourniquet Time Documented: Thigh (Left) - 55 minutes Total: Thigh (Left) - 55 minutes   DICTATION: .Other Dictation: Dictation Number [id number]  PLAN OF CARE: Admit to inpatient   PATIENT DISPOSITION:  PACU - hemodynamically stable.  [id number] Delay start of Pharmacological VTE agent (>24hrs) due to surgical blood loss or risk of bleeding: yes

## 2021-11-15 NOTE — Interval H&P Note (Signed)
History and Physical Interval Note:  11/15/2021 8:47 AM  Daniel [name]  has presented today for surgery, with the diagnosis of aseptic loosening left total knee arthroplasty.  The various methods of treatment have been discussed with the patient and family. After consideration of risks, benefits and other options for treatment, the patient has consented to  Procedure(s): Left knee tibial versus total knee arthroplasty revision (Left) as a surgical intervention.  [name] history has been reviewed, patient examined, no change in status, stable for surgery.  I have reviewed the patient's chart and labs.  Questions were answered to the patient's satisfaction.     Pilar Plate Odilia Damico

## 2021-11-15 NOTE — Progress Notes (Signed)
Orthopedic Tech Progress Note Patient Details:  Daniel [name] August 26, 1966 [id number]  CPM Left Knee CPM Left Knee: On Left Knee Flexion (Degrees): 10 Left Knee Extension (Degrees): 40  Post Interventions Patient Tolerated: Well  Daniel [name] Daniel [name] 11/15/2021, 12:22 PM CPM applied in PACU

## 2021-11-15 NOTE — Anesthesia Postprocedure Evaluation (Signed)
Anesthesia Post Note  Patient: Daniel [name]  Procedure(s) Performed: Left knee tibial versus total knee arthroplasty revision (Left: Knee)     Patient location during evaluation: PACU Anesthesia Type: Spinal Level of consciousness: oriented and awake and alert Pain management: pain level controlled Vital Signs Assessment: post-procedure vital signs reviewed and stable Respiratory status: spontaneous breathing, respiratory function stable and patient connected to nasal cannula oxygen Cardiovascular status: blood pressure returned to baseline and stable Postop Assessment: no headache, no backache and no apparent nausea or vomiting Anesthetic complications: no   No notable events documented.  Last Vitals:  Vitals:   11/15/21 1215 11/15/21 1230  BP: 118/87 116/82  Pulse: 79 66  Resp: 16 12  Temp: 36.4 C   SpO2: 100% 98%    Last Pain:  Vitals:   11/15/21 1230  TempSrc:   PainSc: 0-No pain                 Riven Mabile S

## 2021-11-15 NOTE — Op Note (Signed)
Daniel [name], [place] MEDICAL RECORD NO: [id number] ACCOUNT NO: [id number] DATE OF BIRTH: 24-Jul-1966 FACILITY: [facility] LOCATION: [facility] PHYSICIAN: Dione Plover. Apple Dearmas, MD  Operative Report   DATE OF PROCEDURE: 11/15/2021  PREOPERATIVE DIAGNOSIS:  Aseptic loosening, left total knee arthroplasty.  POSTOPERATIVE DIAGNOSIS:  Aseptic loosening, left total knee arthroplasty.  PROCEDURE:  Left knee tibial revision.  SURGEON:  Dione Plover. Daijon Wenke, MD  ASSISTANT:  Theresa Duty, PA-C  ANESTHESIA:  Adductor canal block and spinal.  ESTIMATED BLOOD LOSS:  50 mL.  DRAIN: None.  TOURNIQUET TIME:  56 minutes at 300 mmHg.  COMPLICATIONS:  None.  CONDITION:  Stable to recovery.  BRIEF CLINICAL NOTE:  The patient is a 55 year old male who had a left total knee arthroplasty done a little over a year ago.  He has had worsening left tibial pain over the past several months.  He had a bone scan, which showed probable loosening of the  tibial component.  Given his pain and lack of response to nonoperative management, it is felt that the tibial component is most likely loose, and he presents now for tibial versus total knee revision.  He had infection workup preoperatively, which was  negative.  DESCRIPTION OF PROCEDURE:  After successful administration of adductor canal block and spinal anesthetic, a tourniquet was placed high on his left thigh, and his left lower extremity was prepped and draped in the usual sterile fashion.  Extremity was  wrapped in Esmarch, knee flexed and the tourniquet inflated to 300 mmHg.  Midline incision was made with a 10 blade through the subcutaneous tissue.  He had a lot of scarring subcutaneously, and those adhesions were released.  A fresh blade was used to  make a medial parapatellar arthrotomy.  There was a small amount of fluid in the joint and that was sent for stat Gram stain, which was negative.  It was also sent for aerobic and anaerobic cultures, which are  pending.  He had a large amount of scar  present in the joint, so the scar was removed from under the extensor mechanism.  This recreated the medial and lateral gutters as well as the superior space and infrapatellar space.  I was then able to evert the patella and flex the knee 90 degrees.  We  removed the tibial polyethylene, which was 8 mm thickness for a size 8 mobile bearing posterior stabilized knee.  This was DePuy Attune.  We then subluxed the tibia forward and placed circumferential retraction.  The tibia was not grossly loose.  I did  disrupt the interface between the tibial component and bone anteriorly with a saw and then I was able to easily remove the component with an osteotome.  It was consistent with some loosening, but again not definitively or grossly loose.  After it was  removed, then I took out the cement from the proximal femoral canal.  We reamed up to 16 mm, which had a great press-fit.  This was in preparation for a 16 x 60 stem extension.  I then placed the extramedullary tibial alignment guide referencing  proximally at the medial aspect of the tibial tubercle and distally along the second metatarsal axis and tibial crest.  Block was pinned to remove about 2-3 mm off the tibial surface.  Resection was made with an oscillating saw.  A size 8 was the most  appropriate tibial component.  We then prepared proximally with the drill for size 8.  I then broached for a 29 sleeve, which  had great purchase.  We made a trial with a size 8 with a 29 broach and a 16 x 60 stem extension.  The fit was excellent.  We  trialed up to a 16 mm insert. With 16, full extension was achieved with excellent varus, valgus, and anterior, posterior balance throughout full range of motion.  The trials were then removed, and cut bone surfaces were prepared with pulsatile lavage.   The components assembled on the back table were a size 8 Attune tibial tray with a 29 mm proximal tibial sleeve and a 16 x 60 stem  extension.  One batch of gentamicin-impregnated cement was mixed.  The stem was press fit and the sleeve was porous coated.   We just cemented on the cut bone surface, and the tibia was impacted with excellent purchase, and the cement was removed.  The trial 16 inserts were placed.  Knee held in full extension.  All extruded cement was removed.  When the cement was hardened,  then the permanent 16 mm posterior stabilized rotating platform insert was placed into the tibial tray.  A total of 20 mL of Exparel mixed with 60 mL of saline was injected into the subcutaneous tissue as well as the extensor mechanism and the periosteum  of the femur.  Further irrigation was performed, and then the permanent 16 mm posterior stabilized rotating platform insert placed into the tibial tray.  The joint was thoroughly irrigated with saline solution and arthrotomy closed with a running 0  Stratafix suture.  Tourniquet was released, total time of 56 minutes.  Subcutaneous was closed with interrupted 2-0 Vicryl and subcuticular running 4-0 Monocryl.  The incision was cleaned and dried, and Steri-Strips and bulky sterile dressing applied.   He was awakened and taken to recovery in stable condition.  Please note that a surgical assistant was of medical necessity for this procedure.  Assistant was necessary for retraction of vital ligaments and neurovascular structures and for proper positioning of the leg for removal of the old implant and safe and  accurate placement of the new implant.   [facility] D: 11/15/2021 11:37:42 am T: 11/15/2021 2:34:00 pm  JOB: [id number]/ [id number]

## 2021-11-15 NOTE — Discharge Instructions (Addendum)
 Frank Aluisio, MD Total Joint Specialist [facility] [address] [place], [place] [address] [phone] POSTOPERATIVE DIRECTIONS  Knee Rehabilitation, Guidelines Following Surgery  Results after knee surgery are often greatly improved when you follow the exercise, range of motion and muscle strengthening exercises prescribed by your doctor. Safety measures are also important to protect the knee from further injury. If any of these exercises cause you to have increased pain or swelling in your knee joint, decrease the amount until you are comfortable again and slowly increase them. If you have problems or questions, call your caregiver or physical therapist for advice.   BLOOD CLOT PREVENTION Take a 325 mg Aspirin two times a day for three weeks following surgery. Then take an 81 mg Aspirin once a day for three weeks. Then discontinue Aspirin. You may resume your vitamins/supplements upon discharge from the hospital. Do not take any NSAIDs (Advil, Aleve, Ibuprofen, Meloxicam, etc.) until you have discontinued the 325 mg Aspirin.  HOME CARE INSTRUCTIONS  Remove items at home which could result in a fall. This includes throw rugs or furniture in walking pathways.  ICE to the affected knee as much as tolerated. Icing helps control swelling. If the swelling is well controlled you will be more comfortable and rehab easier. Continue to use ice on the knee for pain and swelling from surgery. You may notice swelling that will progress down to the foot and ankle. This is normal after surgery. Elevate the leg when you are not up walking on it.    Continue to use the breathing machine which will help keep your temperature down. It is common for your temperature to cycle up and down following surgery, especially at night when you are not up moving around and exerting yourself. The breathing machine keeps your lungs expanded and your temperature down. Do not place pillow  under the operative knee, focus on keeping the knee straight while resting  DIET You may resume your previous home diet once you are discharged from the hospital.  DRESSING / WOUND CARE / SHOWERING Keep your bulky bandage on for 2 days. On the third post-operative day you may remove the Ace bandage and gauze. There is a waterproof adhesive bandage on your skin which will stay in place until your first follow-up appointment. Once you remove this you will not need to place another bandage You may begin showering 3 days following surgery, but do not submerge the incision under water.  ACTIVITY For the first 5 days, the key is rest and control of pain and swelling Do your home exercises twice a day starting on post-operative day 3. On the days you go to physical therapy, just do the home exercises once that day. You should rest, ice and elevate the leg for 50 minutes out of every hour. Get up and walk/stretch for 10 minutes per hour. After 5 days you can increase your activity slowly as tolerated. Walk with your walker as instructed. Use the walker until you are comfortable transitioning to a cane. Walk with the cane in the opposite hand of the operative leg. You may discontinue the cane once you are comfortable and walking steadily. Avoid periods of inactivity such as sitting longer than an hour when not asleep. This helps prevent blood clots.  You may discontinue the knee immobilizer once you are able to perform a straight leg raise while lying down. You may resume a sexual relationship in one month or when given the OK by   your doctor.  You may return to work once you are cleared by your doctor.  Do not drive a car for 6 weeks or until released by your surgeon.  Do not drive while taking narcotics.  TED HOSE STOCKINGS Wear the elastic stockings on both legs for three weeks following surgery during the day. You may remove them at night for sleeping.  WEIGHT BEARING Weight bearing as tolerated  with assist device (walker, cane, etc) as directed, use it as long as suggested by your surgeon or therapist, typically at least 4-6 weeks.  POSTOPERATIVE CONSTIPATION PROTOCOL Constipation - defined medically as fewer than three stools per week and severe constipation as less than one stool per week.  One of the most common issues patients have following surgery is constipation.  Even if you have a regular bowel pattern at home, your normal regimen is likely to be disrupted due to multiple reasons following surgery.  Combination of anesthesia, postoperative narcotics, change in appetite and fluid intake all can affect your bowels.  In order to avoid complications following surgery, here are some recommendations in order to help you during your recovery period.  Colace (docusate) - Pick up an over-the-counter form of Colace or another stool softener and take twice a day as long as you are requiring postoperative pain medications.  Take with a full glass of water daily.  If you experience loose stools or diarrhea, hold the colace until you stool forms back up. If your symptoms do not get better within 1 week or if they get worse, check with your doctor. Dulcolax (bisacodyl) - Pick up over-the-counter and take as directed by the product packaging as needed to assist with the movement of your bowels.  Take with a full glass of water.  Use this product as needed if not relieved by Colace only.  MiraLax (polyethylene glycol) - Pick up over-the-counter to have on hand. MiraLax is a solution that will increase the amount of water in your bowels to assist with bowel movements.  Take as directed and can mix with a glass of water, juice, soda, coffee, or tea. Take if you go more than two days without a movement. Do not use MiraLax more than once per day. Call your doctor if you are still constipated or irregular after using this medication for 7 days in a row.  If you continue to have problems with postoperative  constipation, please contact the office for further assistance and recommendations.  If you experience "the worst abdominal pain ever" or develop nausea or vomiting, please contact the office immediatly for further recommendations for treatment.  ITCHING If you experience itching with your medications, try taking only a single pain pill, or even half a pain pill at a time.  You can also use Benadryl over the counter for itching or also to help with sleep.   MEDICATIONS See your medication summary on the "After Visit Summary" that the nursing staff will review with you prior to discharge.  You may have some home medications which will be placed on hold until you complete the course of blood thinner medication.  It is important for you to complete the blood thinner medication as prescribed by your surgeon.  Continue your approved medications as instructed at time of discharge.  PRECAUTIONS If you experience chest pain or shortness of breath - call 911 immediately for transfer to the hospital emergency department.  If you develop a fever greater that 101 F, purulent drainage from wound, increased redness   or drainage from wound, foul odor from the wound/dressing, or calf pain - CONTACT YOUR SURGEON.                                                   FOLLOW-UP APPOINTMENTS Make sure you keep all of your appointments after your operation with your surgeon and caregivers. You should call the office at the above phone number and make an appointment for approximately two weeks after the date of your surgery or on the date instructed by your surgeon outlined in the "After Visit Summary".  RANGE OF MOTION AND STRENGTHENING EXERCISES  Rehabilitation of the knee is important following a knee injury or an operation. After just a few days of immobilization, the muscles of the thigh which control the knee become weakened and shrink (atrophy). Knee exercises are designed to build up the tone and strength of the thigh  muscles and to improve knee motion. Often times heat used for twenty to thirty minutes before working out will loosen up your tissues and help with improving the range of motion but do not use heat for the first two weeks following surgery. These exercises can be done on a training (exercise) mat, on the floor, on a table or on a bed. Use what ever works the best and is most comfortable for you Knee exercises include:  Leg Lifts - While your knee is still immobilized in a splint or cast, you can do straight leg raises. Lift the leg to 60 degrees, hold for 3 sec, and slowly lower the leg. Repeat 10-20 times 2-3 times daily. Perform this exercise against resistance later as your knee gets better.  Quad and Hamstring Sets - Tighten up the muscle on the front of the thigh (Quad) and hold for 5-10 sec. Repeat this 10-20 times hourly. Hamstring sets are done by pushing the foot backward against an object and holding for 5-10 sec. Repeat as with quad sets.  Leg Slides: Lying on your back, slowly slide your foot toward your buttocks, bending your knee up off the floor (only go as far as is comfortable). Then slowly slide your foot back down until your leg is flat on the floor again. Angel Wings: Lying on your back spread your legs to the side as far apart as you can without causing discomfort.  A rehabilitation program following serious knee injuries can speed recovery and prevent re-injury in the future due to weakened muscles. Contact your doctor or a physical therapist for more information on knee rehabilitation.   POST-OPERATIVE OPIOID TAPER INSTRUCTIONS: It is important to wean off of your opioid medication as soon as possible. If you do not need pain medication after your surgery it is ok to stop day one. Opioids include: Codeine, Hydrocodone(Norco, Vicodin), Oxycodone(Percocet, oxycontin) and hydromorphone amongst others.  Long term and even short term use of opiods can cause: Increased pain  response Dependence Constipation Depression Respiratory depression And more.  Withdrawal symptoms can include Flu like symptoms Nausea, vomiting And more Techniques to manage these symptoms Hydrate well Eat regular healthy meals Stay active Use relaxation techniques(deep breathing, meditating, yoga) Do Not substitute Alcohol to help with tapering If you have been on opioids for less than two weeks and do not have pain than it is ok to stop all together.  Plan to wean off of opioids This   plan should start within one week post op of your joint replacement. Maintain the same interval or time between taking each dose and first decrease the dose.  Cut the total daily intake of opioids by one tablet each day Next start to increase the time between doses. The last dose that should be eliminated is the evening dose.   IF YOU ARE TRANSFERRED TO A SKILLED REHAB FACILITY If the patient is transferred to a skilled rehab facility following release from the hospital, a list of the current medications will be sent to the facility for the patient to continue.  When discharged from the skilled rehab facility, please have the facility set up the patient's Home Health Physical Therapy prior to being released. Also, the skilled facility will be responsible for providing the patient with their medications at time of release from the facility to include their pain medication, the muscle relaxants, and their blood thinner medication. If the patient is still at the rehab facility at time of the two week follow up appointment, the skilled rehab facility will also need to assist the patient in arranging follow up appointment in our office and any transportation needs.  MAKE SURE YOU:  Understand these instructions.  Get help right away if you are not doing well or get worse.   DENTAL ANTIBIOTICS:  In most cases prophylactic antibiotics for Dental procdeures after total joint surgery are not  necessary.  Exceptions are as follows:  1. History of prior total joint infection  2. Severely immunocompromised (Organ Transplant, cancer chemotherapy, Rheumatoid biologic meds such as Humera)  3. Poorly controlled diabetes (A1C &gt; 8.0, blood glucose over 200)  If you have one of these conditions, contact your surgeon for an antibiotic prescription, prior to your dental procedure.    Pick up stool softner and laxative for home use following surgery while on pain medications. Do not submerge incision under water. Please use good hand washing techniques while changing dressing each day. May shower starting three days after surgery. Please use a clean towel to pat the incision dry following showers. Continue to use ice for pain and swelling after surgery. Do not use any lotions or creams on the incision until instructed by your surgeon.  

## 2021-11-15 NOTE — Anesthesia Preprocedure Evaluation (Signed)
Anesthesia Evaluation  Patient identified by MRN, date of birth, ID band Patient awake    Reviewed: Allergy & Precautions, H&P , NPO status , Patient's Chart, lab work & pertinent test results  Airway Mallampati: II  TM Distance: >3 FB Neck ROM: Full    Dental no notable dental hx.    Pulmonary asthma ,    Pulmonary exam normal breath sounds clear to auscultation       Cardiovascular hypertension, Pt. on medications Normal cardiovascular exam Rhythm:Regular Rate:Normal     Neuro/Psych negative neurological ROS  negative psych ROS   GI/Hepatic negative GI ROS, Neg liver ROS,   Endo/Other  diabetes, Type 2  Renal/GU negative Renal ROS  negative genitourinary   Musculoskeletal  (+) Arthritis , Osteoarthritis,    Abdominal   Peds negative pediatric ROS (+)  Hematology negative hematology ROS (+)   Anesthesia Other Findings   Reproductive/Obstetrics negative OB ROS                             Anesthesia Physical Anesthesia Plan  ASA: 2  Anesthesia Plan: Spinal   Post-op Pain Management: Regional block   Induction: Intravenous  PONV Risk Score and Plan: 2 and Ondansetron, Propofol infusion and Treatment may vary due to age or medical condition  Airway Management Planned: Simple Face Mask  Additional Equipment:   Intra-op Plan:   Post-operative Plan:   Informed Consent: I have reviewed the patients History and Physical, chart, labs and discussed the procedure including the risks, benefits and alternatives for the proposed anesthesia with the patient or authorized representative who has indicated his/her understanding and acceptance.     Dental advisory given  Plan Discussed with: CRNA and Surgeon  Anesthesia Plan Comments:         Anesthesia Quick Evaluation

## 2021-11-15 NOTE — Anesthesia Procedure Notes (Signed)
Spinal  Patient location during procedure: OR End time: 11/15/2021 9:59 AM Reason for block: surgical anesthesia Staffing Performed: resident/CRNA  Resident/CRNA: Jennafer Gladue D, CRNA Preanesthetic Checklist Completed: patient identified, IV checked, site marked, risks and benefits discussed, surgical consent, monitors and equipment checked, pre-op evaluation and timeout performed Spinal Block Patient position: sitting Prep: ChloraPrep Patient monitoring: heart rate, continuous pulse ox and blood pressure Approach: midline Location: L3-4 Injection technique: single-shot Needle Needle type: Spinocan  Needle gauge: 24 G Needle length: 9 cm Assessment Sensory level: T6 Events: CSF return

## 2021-11-15 NOTE — Evaluation (Signed)
Physical Therapy Evaluation Patient Details Name: Daniel [name] MRN: [id number] DOB: January 17, 1966 Today's Date: 11/15/2021  History of Present Illness  Patient is 55 y.o. male s/p Lt TKR for tibial component revision on 11/15/21 with PMH significant for asthma, DM, GERD, depression, HLD, HTN, Lt TKA on 03/07/20.   Clinical Impression  Daniel [name] is a 55 y.o. male POD 0 s/p Lt TKR. Patient reports independence with mobility at baseline. Patient is now limited by functional impairments (see PT problem list below) and requires min assist/guard for transfers and gait with RW. Patient was able to ambulate ~45 feet with RW and min assist/guard. Patient instructed in exercise to facilitate circulation to manage edema and reduce risk of DVT. Patient will benefit from continued skilled PT interventions to address impairments and progress towards PLOF. Acute PT will follow to progress mobility and stair training in preparation for safe discharge home.        Recommendations for follow up therapy are one component of a multi-disciplinary discharge planning process, led by the attending physician.  Recommendations may be updated based on patient status, additional functional criteria and insurance authorization.  Follow Up Recommendations Follow physician's recommendations for discharge plan and follow up therapies    Assistance Recommended at Discharge Intermittent Supervision/Assistance  Functional Status Assessment Patient has had a recent decline in their functional status and demonstrates the ability to make significant improvements in function in a reasonable and predictable amount of time.  Equipment Recommendations  Rolling walker (2 wheels);BSC/3in1 (working with worker's comp for DME (needs BSC or toilet riser))    Recommendations for Other Services       Precautions / Restrictions Precautions Precautions: Fall Restrictions Weight Bearing Restrictions: No Other Position/Activity  Restrictions: WBAT      Mobility  Bed Mobility Overal bed mobility: Needs Assistance Bed Mobility: Supine to Sit     Supine to sit: Min assist;HOB elevated     General bed mobility comments: cues to use bed rail, light assist for Lt LE off EOB.    Transfers Overall transfer level: Needs assistance Equipment used: Rolling walker (2 wheels) Transfers: Sit to/from Stand Sit to Stand: Min guard           General transfer comment: guarding for safety and cues for hand placement, pt eager to stand and no assist needed.    Ambulation/Gait Ambulation/Gait assistance: Min assist;Min guard Gait Distance (Feet): 45 Feet Assistive device: Rolling walker (2 wheels) Gait Pattern/deviations: Step-to pattern;Decreased stance time - left;Decreased weight shift to left Gait velocity: decr     General Gait Details: cues for step to pattern and pt occasionally moving into step through with no buckling or LOB noted. assist at start for walker management and progressing to min guard.  Stairs            Wheelchair Mobility    Modified Rankin (Stroke Patients Only)       Balance Overall balance assessment: Needs assistance Sitting-balance support: Feet supported Sitting balance-Leahy Scale: Good     Standing balance support: During functional activity;Reliant on assistive device for balance;Bilateral upper extremity supported Standing balance-Leahy Scale: Fair                               Pertinent Vitals/Pain Pain Assessment: No/denies pain    Home Living Family/patient expects to be discharged to:: Private residence Living Arrangements: Spouse/significant other Available Help at Discharge: Family Type of Home: [facility]  Access: Stairs to enter   [employer] of Steps: 2 Alternate Level Stairs-Number of Steps: flight Home Layout: Two level Home Equipment: None (working with worker's comp for DME (needs BSC or toilet riser))      Prior  Function Prior Level of Function : Independent/Modified Independent                     Hand Dominance   Dominant Hand: Right    Extremity/Trunk Assessment   Upper Extremity Assessment Upper Extremity Assessment: Overall WFL for tasks assessed    Lower Extremity Assessment Lower Extremity Assessment: Overall WFL for tasks assessed    Cervical / Trunk Assessment Cervical / Trunk Assessment: Normal  Communication   Communication: No difficulties  Cognition Arousal/Alertness: Awake/alert Behavior During Therapy: WFL for tasks assessed/performed Overall Cognitive Status: Within Functional Limits for tasks assessed                                          General Comments      Exercises Total Joint Exercises Ankle Circles/Pumps: AROM;Both;20 reps;Seated   Assessment/Plan    PT Assessment Patient needs continued PT services  PT Problem List Decreased strength;Decreased range of motion;Decreased activity tolerance;Decreased balance;Decreased mobility;Decreased safety awareness;Decreased knowledge of use of DME;Pain;Decreased knowledge of precautions       PT Treatment Interventions DME instruction;Gait training;Stair training;Functional mobility training;Balance training;Therapeutic exercise;Therapeutic activities;Patient/family education    PT Goals (Current goals can be found in the Care Plan section)  Acute Rehab PT Goals Patient Stated Goal: get recovered and be able to hunt again PT Goal Formulation: With patient Time For Goal Achievement: 11/22/21 Potential to Achieve Goals: Good    Frequency 7X/week   Barriers to discharge        Co-evaluation               AM-PAC PT "6 Clicks" Mobility  Outcome Measure Help needed turning from your back to your side while in a flat bed without using bedrails?: A Little Help needed moving from lying on your back to sitting on the side of a flat bed without using bedrails?: A Little Help  needed moving to and from a bed to a chair (including a wheelchair)?: A Little Help needed standing up from a chair using your arms (e.g., wheelchair or bedside chair)?: A Little Help needed to walk in hospital room?: A Little Help needed climbing 3-5 steps with a railing? : A Lot 6 Click Score: 17    End of Session Equipment Utilized During Treatment: Gait belt Activity Tolerance: Patient tolerated treatment well Patient left: in chair;with call bell/phone within reach;with chair alarm set;with family/visitor present Nurse Communication: Mobility status PT Visit Diagnosis: Muscle weakness (generalized) (M62.81);Difficulty in walking, not elsewhere classified (R26.2);Dizziness and giddiness (R42)    Time: 9528-4132 PT Time Calculation (min) (ACUTE ONLY): 21 min   Charges:   PT Evaluation $PT Eval Low Complexity: 1 Low          Verner Mould, DPT Acute Rehabilitation Services Office [phone] Pager [phone]   Jacques Navy 11/15/2021, 5:40 PM

## 2021-11-15 NOTE — Anesthesia Procedure Notes (Signed)
Anesthesia Procedure Image    

## 2021-11-16 ENCOUNTER — Encounter (HOSPITAL_COMMUNITY): Payer: Self-pay | Admitting: Orthopedic Surgery

## 2021-11-16 LAB — CBC
HCT: 37.6 % — ABNORMAL LOW (ref 39.0–52.0)
Hemoglobin: 13.9 g/dL (ref 13.0–17.0)
MCH: 31.4 pg (ref 26.0–34.0)
MCHC: 37 g/dL — ABNORMAL HIGH (ref 30.0–36.0)
MCV: 84.9 fL (ref 80.0–100.0)
Platelets: 140 10*3/uL — ABNORMAL LOW (ref 150–400)
RBC: 4.43 MIL/uL (ref 4.22–5.81)
RDW: 13 % (ref 11.5–15.5)
WBC: 11.8 10*3/uL — ABNORMAL HIGH (ref 4.0–10.5)
nRBC: 0 % (ref 0.0–0.2)

## 2021-11-16 LAB — BASIC METABOLIC PANEL
Anion gap: 7 (ref 5–15)
BUN: 19 mg/dL (ref 6–20)
CO2: 25 mmol/L (ref 22–32)
Calcium: 8.6 mg/dL — ABNORMAL LOW (ref 8.9–10.3)
Chloride: 105 mmol/L (ref 98–111)
Creatinine, Ser: 1.28 mg/dL — ABNORMAL HIGH (ref 0.61–1.24)
GFR, Estimated: >60 mL/min (ref >60)
Glucose, Bld: 117 mg/dL — ABNORMAL HIGH (ref 70–99)
Potassium: 4.2 mmol/L (ref 3.5–5.1)
Sodium: 137 mmol/L (ref 135–145)

## 2021-11-16 LAB — GLUCOSE, CAPILLARY
Glucose-Capillary: 102 mg/dL — ABNORMAL HIGH (ref 70–99)
Glucose-Capillary: 200 mg/dL — ABNORMAL HIGH (ref 70–99)

## 2021-11-16 MED ORDER — OXYCODONE HCL 5 MG PO TABS: 5.0000 mg | ORAL_TABLET | Freq: Four times a day (QID) | ORAL | 0 refills | Status: AC | PRN

## 2021-11-16 MED ORDER — TRAMADOL HCL 50 MG PO TABS
50.0000 mg | ORAL_TABLET | Freq: Four times a day (QID) | ORAL | 0 refills | Status: AC | PRN
Start: 2021-11-16 — End: ?

## 2021-11-16 MED ORDER — METHOCARBAMOL 500 MG PO TABS: 500.0000 mg | ORAL_TABLET | Freq: Four times a day (QID) | ORAL | 0 refills | Status: AC | PRN

## 2021-11-16 MED ORDER — GABAPENTIN 300 MG PO CAPS: ORAL_CAPSULE | ORAL | 0 refills | Status: AC

## 2021-11-16 MED ORDER — ASPIRIN 325 MG PO TBEC: 325.0000 mg | DELAYED_RELEASE_TABLET | Freq: Two times a day (BID) | ORAL | 0 refills | Status: DC

## 2021-11-16 NOTE — TOC Transition Note (Signed)
Transition of Care [facility]) - CM/SW Discharge Note  Patient Details  Name: Daniel [name] MRN: [id number] Date of Birth: January 24, 1966  [facility] CM/SW Contact:  Sherie Don, LCSW Phone Number: 11/16/2021, 1:28 PM  Clinical Narrative: Patient is expected to discharge home after working with PT. CSW met with patient to confirm discharge plan and needs as patient will need DME set up through [employer] Patient will go to OPPT at [facility]. CSW called Modena Nunnery [phone]) with [employer] regarding the patient's DME. Ms. Vira Blanco requested that a local DME agency or the hospital provide the DME and [employer] would provide reimbursement. CSW explained that the DME agencies do not provide DME to Worker's Comp cases due to issues with billing/reimbursement. Ms. Vira Blanco requested that DME orders for a rolling walker and toilet riser be faxed to her [phone]). DME orders faxed to [employer].  CSW received call from [place]. with Worker's Comp. Per Placido Sou, the rolling walker order was sent to [employer] several weeks ago and the walker delivery cutoff time was yesterday. Per Placido Sou, the walker and toilet riser will be delivered to the patient tomorrow. CSW updated patient and RN. TOC signing off.  Final next level of care: OP Rehab Barriers to Discharge: No Barriers Identified  Patient Goals and CMS Choice Patient states their goals for this hospitalization and ongoing recovery are:: Discharge home with OPPT and get DME through [facility] Comp Choice offered to / list presented to : NA  Discharge Plan and Services         DME Arranged:  (Rolling walker and toilet riser were ordered through [employer] w/Worker's Comp)  Readmission Risk Interventions No flowsheet data found.

## 2021-11-16 NOTE — Progress Notes (Signed)
Subjective: 1 Day Post-Op Procedure(s) (LRB): Left knee tibial versus total knee arthroplasty revision (Left) Patient reports pain as mild.   Patient seen in rounds by Dr. Wynelle Link. Patient is well, and has had no acute complaints or problems. Denies SOB, chest pain, or calf pain. No acute overnight events. Ambulated 45 feet with therapy yesterday. Will continue therapy today.   Objective: Vital signs in last 24 hours: Temp:  [97.6 F (36.4 C)-98.9 F (37.2 C)] 98 F (36.7 C) (12/01 0609) Pulse Rate:  [56-85] 84 (12/01 0609) Resp:  [10-18] 16 (12/01 0609) BP: (111-156)/(79-100) 141/84 (12/01 0609) SpO2:  [98 %-100 %] 100 % (12/01 0609) Weight:  [95.8 kg] 95.8 kg (11/30 1500)  Intake/Output from previous day:  Intake/Output Summary (Last 24 hours) at 11/16/2021 0746 Last data filed at 11/16/2021 4315 Gross per 24 hour  Intake 4649.34 ml  Output 3025 ml  Net 1624.34 ml     Intake/Output this shift: No intake/output data recorded.  Labs: Recent Labs    11/16/21 0316  HGB 13.9   Recent Labs    11/16/21 0316  WBC 11.8*  RBC 4.43  HCT 37.6*  PLT 140*   Recent Labs    11/16/21 0316  NA 137  K 4.2  CL 105  CO2 25  BUN 19  CREATININE 1.28*  GLUCOSE 117*  CALCIUM 8.6*   No results for input(s): LABPT, INR in the last 72 hours.  Exam: General - Patient is Alert and Oriented Extremity - Neurologically intact Neurovascular intact Intact pulses distally Dorsiflexion/Plantar flexion intact Dressing - dressing C/D/I Motor Function - intact, moving foot and toes well on exam.   Past Medical History:  Diagnosis Date   Asthma    Depression    h/o   Diabetes mellitus    GERD (gastroesophageal reflux disease)    H/O Clostridium difficile infection 08/2015   Hyperlipidemia    Hypertension    Insomnia    Polyarthralgia 2009   blood work (-) CKs slightly elevated, bone san (-) saw rheumatology; continue w/ somptoms after holding zocor    Assessment/Plan: 1  Day Post-Op Procedure(s) (LRB): Left knee tibial versus total knee arthroplasty revision (Left) Principal Problem:   Failed total knee arthroplasty ([place]) Active Problems:   Failed total left knee replacement ([facility])  Estimated body mass index is 29.88 kg/m as calculated from the following:   Height as of this encounter: 5' 10.5" (1.791 m).   Weight as of this encounter: 95.8 kg. Up with therapy   Patient's anticipated LOS is less than 2 midnights, meeting these requirements: - Younger than 69 - Lives within 1 hour of care - Has a competent adult at home to recover with post-op - NO history of  - Chronic pain requiring opioids  - Diabetes  - Coronary Artery Disease  - Heart failure  - Heart attack  - Stroke  - DVT/VTE  - Cardiac arrhythmia  - Respiratory Failure/COPD  - Renal failure  - Anemia  - Advanced Liver disease     DVT Prophylaxis - Aspirin and TED hose Weight bearing as tolerated. Continue therapy.  Plan for two sessions with PT this morning, and if meeting goals, will plan for discharge this afternoon.   Patient to follow up in two weeks with Dr. Wynelle Link in clinic.   The PDMP database was reviewed today prior to any opioid medications being prescribed to this patient..   Plan is to go Home after hospital stay.  Fenton Foy, MBA, PA-C  Orthopedic Surgery 11/16/2021, 7:46 AM

## 2021-11-16 NOTE — Progress Notes (Signed)
Physical Therapy Treatment Patient Details Name: Daniel [name] MRN: [id number] DOB: 12/13/1966 Today's Date: 11/16/2021   History of Present Illness Patient is 55 y.o. male s/p Lt TKR for tibial component revision on 11/15/21 with PMH significant for asthma, DM, GERD, depression, HLD, HTN, Lt TKA on 03/07/20.    PT Comments    Pt is progressing well with mobility and is ready to DC home from a PT standpoint. He ambulated 150' with RW without loss of balance, completed stair training, and demonstrates good understanding of HEP.    Recommendations for follow up therapy are one component of a multi-disciplinary discharge planning process, led by the attending physician.  Recommendations may be updated based on patient status, additional functional criteria and insurance authorization.  Follow Up Recommendations  Follow physician's recommendations for discharge plan and follow up therapies     Assistance Recommended at Discharge Intermittent Supervision/Assistance  Equipment Recommendations  Rolling walker (2 wheels);BSC/3in1 (working with worker's comp for DME (needs BSC or toilet riser))    Recommendations for Other Services       Precautions / Restrictions Precautions Precautions: Fall;Knee Precaution Booklet Issued: Yes (comment) Precaution Comments: reviewed no pillow under knee Restrictions Weight Bearing Restrictions: No Other Position/Activity Restrictions: WBAT     Mobility  Bed Mobility Overal bed mobility: Modified Independent Bed Mobility: Supine to Sit     Supine to sit: Modified independent (Device/Increase time);HOB elevated     General bed mobility comments: used rail    Transfers Overall transfer level: Needs assistance Equipment used: Rolling walker (2 wheels) Transfers: Sit to/from Stand Sit to Stand: Supervision           General transfer comment: VCs for hand placement    Ambulation/Gait Ambulation/Gait assistance: Supervision Gait  Distance (Feet): 150 Feet Assistive device: Rolling walker (2 wheels) Gait Pattern/deviations: Step-to pattern;Decreased step length - right;Decreased step length - left Gait velocity: decr     General Gait Details: steady with RW, no loss of balance   Stairs Stairs: Yes Stairs assistance: Supervision Stair Management: No rails;Backwards;With walker;Step to pattern Number of Stairs: 2 General stair comments: 2 steps backwards without rail; then 5 stairs with B rails forwards, VCs sequencing   Wheelchair Mobility    Modified Rankin (Stroke Patients Only)       Balance Overall balance assessment: Needs assistance Sitting-balance support: Feet supported Sitting balance-Leahy Scale: Good     Standing balance support: During functional activity;Reliant on assistive device for balance;Bilateral upper extremity supported Standing balance-Leahy Scale: Fair                              Cognition Arousal/Alertness: Awake/alert Behavior During Therapy: WFL for tasks assessed/performed Overall Cognitive Status: Within Functional Limits for tasks assessed                                          Exercises Total Joint Exercises Ankle Circles/Pumps: AROM;Both;20 reps;Seated Quad Sets: AROM;Both;5 reps;Supine Short Arc Quad: AROM;Left;10 reps;Supine Heel Slides: AAROM;Left;5 reps;Supine Hip ABduction/ADduction: AAROM;Left;10 reps;Supine Straight Leg Raises: AROM;Left;5 reps;Supine Long Arc Quad: AROM;Left;5 reps;Seated Knee Flexion: AAROM;Left;10 reps;Seated    General Comments        Pertinent Vitals/Pain Pain Assessment: 0-10 Pain Score: 8  Pain Location: L knee with mobility Pain Descriptors / Indicators: Sore Pain Intervention(s): Limited activity within patient's tolerance;Monitored during session;Ice  applied;Patient requesting pain meds-RN notified;RN gave pain meds during session;Repositioned    Home Living                           Prior Function            PT Goals (current goals can now be found in the care plan section) Acute Rehab PT Goals Patient Stated Goal: get recovered and be able to hunt again PT Goal Formulation: With patient Time For Goal Achievement: 11/22/21 Potential to Achieve Goals: Good Progress towards PT goals: Progressing toward goals    Frequency    7X/week      PT Plan Current plan remains appropriate    Co-evaluation              AM-PAC PT "6 Clicks" Mobility   Outcome Measure  Help needed turning from your back to your side while in a flat bed without using bedrails?: A Little Help needed moving from lying on your back to sitting on the side of a flat bed without using bedrails?: A Little Help needed moving to and from a bed to a chair (including a wheelchair)?: A Little Help needed standing up from a chair using your arms (e.g., wheelchair or bedside chair)?: A Little Help needed to walk in hospital room?: A Little Help needed climbing 3-5 steps with a railing? : A Lot 6 Click Score: 17    End of Session Equipment Utilized During Treatment: Gait belt Activity Tolerance: Patient tolerated treatment well Patient left: in chair;with call bell/phone within reach;with chair alarm set Nurse Communication: Mobility status PT Visit Diagnosis: Muscle weakness (generalized) (M62.81);Difficulty in walking, not elsewhere classified (R26.2);Dizziness and giddiness (R42)     Time: 4580-9983 PT Time Calculation (min) (ACUTE ONLY): 33 min  Charges:  $Gait Training: 8-22 mins $Therapeutic Exercise: 8-22 mins      Blondell Reveal Kistler PT 11/16/2021  Acute Rehabilitation Services Pager [phone] Office [phone]

## 2021-11-16 NOTE — Plan of Care (Signed)
Plan of care reviewed and discussed with the patient. 

## 2021-11-20 LAB — AEROBIC/ANAEROBIC CULTURE W GRAM STAIN (SURGICAL/DEEP WOUND): Culture: NO GROWTH

## 2021-11-26 NOTE — Discharge Summary (Signed)
Physician Discharge Summary   Patient ID: Daniel [name] MRN: [id number] DOB/AGE: [birth year] 57 y.o.  Admit date: 11/15/2021 Discharge date: 11/16/2021  Primary Diagnosis: Aseptic loosening, left total knee arthroplasty  Admission Diagnoses:  Past Medical History:  Diagnosis Date   Asthma    Depression    h/o   Diabetes mellitus    GERD (gastroesophageal reflux disease)    H/O Clostridium difficile infection 08/2015   Hyperlipidemia    Hypertension    Insomnia    Polyarthralgia 2009   blood work (-) CKs slightly elevated, bone san (-) saw rheumatology; continue w/ somptoms after holding zocor   Discharge Diagnoses:   Principal Problem:   Failed total knee arthroplasty ([place]) Active Problems:   Failed total left knee replacement ([place])  Estimated body mass index is 29.88 kg/m as calculated from the following:   Height as of this encounter: 5' 10.5" (1.791 m).   Weight as of this encounter: 95.8 kg.  Procedure:  Procedure(s) (LRB): Left knee tibial versus total knee arthroplasty revision (Left)   Consults: None  HPI: The patient is a 55 year old male who had a left total knee arthroplasty done a little over a year ago.  He has had worsening left tibial pain over the past several months.  He had a bone scan, which showed probable loosening of the  tibial component.  Given his pain and lack of response to nonoperative management, it is felt that the tibial component is most likely loose, and he presents now for tibial versus total knee revision.  He had infection workup preoperatively, which was  negative.  Laboratory Data: Admission on 11/15/2021, Discharged on 11/16/2021  Component Date Value Ref Range Status   Glucose-Capillary 11/15/2021 94  70 - 99 mg/dL Final   Glucose reference range applies only to samples taken after fasting for at least 8 hours.   Specimen Description 11/15/2021    Final                   Value:SYNOVIAL LT KNEE Performed at [facility], [place] [address] [place], [place] [address]    Special Requests 11/15/2021    Final                   Value:NONE Performed at [facility], [place] [address] [place], [place] [address]    Gram Stain 11/15/2021    Final                   Value:RARE WBC PRESENT, PREDOMINANTLY PMN NO ORGANISMS SEEN Gram Stain Report Called to,Read Back By and Verified With: GQBVQX,I. RN @1114  ON 11.30.2022 BY [facility] Performed at [facility], [place] [address], [place], [place] [address]    Culture 11/15/2021    Final                   Value:No growth aerobically or anaerobically. Performed at [facility], [place] [address] [place], [place] [address]    Report Status 11/15/2021 11/20/2021 FINAL   Final   Glucose-Capillary 11/15/2021 103 (H)  70 - 99 mg/dL Final   Glucose reference range applies only to samples taken after fasting for at least 8 hours.   Glucose-Capillary 11/15/2021 125 (H)  70 - 99 mg/dL Final   Glucose reference range applies only to samples taken after fasting for at least 8 hours.   WBC 11/16/2021 11.8 (H)  4.0 - 10.5 K/uL Final   RBC  11/16/2021 4.43  4.22 - 5.81 MIL/uL Final   Hemoglobin 11/16/2021 13.9  13.0 - 17.0 g/dL Final   HCT 11/16/2021 37.6 (L)  39.0 - 52.0 % Final   MCV 11/16/2021 84.9  80.0 - 100.0 fL Final   MCH 11/16/2021 31.4  26.0 - 34.0 pg Final   MCHC 11/16/2021 37.0 (H)  30.0 - 36.0 g/dL Final   RDW 11/16/2021 13.0  11.5 - 15.5 % Final   Platelets 11/16/2021 140 (L)  150 - 400 K/uL Final   REPEATED TO VERIFY   nRBC 11/16/2021 0.0  0.0 - 0.2 % Final   Performed at [facility], [place] [address], [place], [place] [address]   Sodium 11/16/2021 137  135 - 145 mmol/L Final   Potassium 11/16/2021 4.2  3.5 - 5.1 mmol/L Final   Chloride 11/16/2021 105  98 - 111 mmol/L Final   CO2 11/16/2021 25  22 - 32 mmol/L Final   Glucose, Bld 11/16/2021 117 (H)  70 - 99 mg/dL Final   Glucose reference range  applies only to samples taken after fasting for at least 8 hours.   BUN 11/16/2021 19  6 - 20 mg/dL Final   Creatinine, Ser 11/16/2021 1.28 (H)  0.61 - 1.24 mg/dL Final   Calcium 11/16/2021 8.6 (L)  8.9 - 10.3 mg/dL Final   GFR, Estimated 11/16/2021 >60  >60 mL/min Final   Comment: (NOTE) Calculated using the CKD-EPI Creatinine Equation (2021)    Anion gap 11/16/2021 7  5 - 15 Final   Performed at [facility], [place] [address], [place], [place] [address]   Glucose-Capillary 11/15/2021 147 (H)  70 - 99 mg/dL Final   Glucose reference range applies only to samples taken after fasting for at least 8 hours.   Glucose-Capillary 11/15/2021 233 (H)  70 - 99 mg/dL Final   Glucose reference range applies only to samples taken after fasting for at least 8 hours.   Glucose-Capillary 11/16/2021 102 (H)  70 - 99 mg/dL Final   Glucose reference range applies only to samples taken after fasting for at least 8 hours.   Glucose-Capillary 11/16/2021 200 (H)  70 - 99 mg/dL Final   Glucose reference range applies only to samples taken after fasting for at least 8 hours.  Orders Only on 11/13/2021  Component Date Value Ref Range Status   SARS Coronavirus 2 11/13/2021 RESULT: NEGATIVE   Final   Comment: RESULT: NEGATIVESARS-CoV-2 INTERPRETATION:A NEGATIVE  test result means that SARS-CoV-2 RNA was not present in the specimen above the limit of detection of this test. This does not preclude a possible SARS-CoV-2 infection and should not be used as the  sole basis for patient management decisions. Negative results must be combined with clinical observations, patient history, and epidemiological information. Optimum specimen types and timing for peak viral levels during infections caused by SARS-CoV-2  have not been determined. Collection of multiple specimens or types of specimens may be necessary to detect virus. Improper specimen collection and handling, sequence variability under primers/probes,  or organism present below the limit of detection may  lead to false negative results. Positive and negative predictive values of testing are highly dependent on prevalence. False negative test results are more likely when prevalence of disease is high.The expected result is NEGATIVE.Fact S                          heet for  Healthcare Providers: LocalChronicle.no Sheet  for Patients: SalonLookup.es Reference Range - Negative   Hospital Outpatient Visit on 11/02/2021  Component Date Value Ref Range Status   MRSA, PCR 11/02/2021 NEGATIVE  NEGATIVE Final   Staphylococcus aureus 11/02/2021 NEGATIVE  NEGATIVE Final   Comment: (NOTE) The Xpert SA Assay (FDA approved for NASAL specimens in patients 109 years of age and older), is one component of a comprehensive surveillance program. It is not intended to diagnose infection nor to guide or monitor treatment. Performed at [facility], [place] [address], [place], [place] [address]    WBC 11/02/2021 6.1  4.0 - 10.5 K/uL Final   RBC 11/02/2021 4.72  4.22 - 5.81 MIL/uL Final   Hemoglobin 11/02/2021 14.8  13.0 - 17.0 g/dL Final   HCT 11/02/2021 40.8  39.0 - 52.0 % Final   MCV 11/02/2021 86.4  80.0 - 100.0 fL Final   MCH 11/02/2021 31.4  26.0 - 34.0 pg Final   MCHC 11/02/2021 36.3 (H)  30.0 - 36.0 g/dL Final   RDW 11/02/2021 13.4  11.5 - 15.5 % Final   Platelets 11/02/2021 134 (L)  150 - 400 K/uL Final   nRBC 11/02/2021 0.0  0.0 - 0.2 % Final   Performed at [facility], [place] [address] [place], [place] [address]   Sodium 11/02/2021 138  135 - 145 mmol/L Final   Potassium 11/02/2021 3.9  3.5 - 5.1 mmol/L Final   Chloride 11/02/2021 104  98 - 111 mmol/L Final   CO2 11/02/2021 24  22 - 32 mmol/L Final   Glucose, Bld 11/02/2021 109 (H)  70 - 99 mg/dL Final   Glucose reference range applies only to samples taken after fasting for at least 8 hours.   BUN  11/02/2021 20  6 - 20 mg/dL Final   Creatinine, Ser 11/02/2021 1.31 (H)  0.61 - 1.24 mg/dL Final   Calcium 11/02/2021 9.0  8.9 - 10.3 mg/dL Final   Total Protein 11/02/2021 7.0  6.5 - 8.1 g/dL Final   Albumin 11/02/2021 4.2  3.5 - 5.0 g/dL Final   AST 11/02/2021 21  15 - 41 U/L Final   ALT 11/02/2021 22  0 - 44 U/L Final   Alkaline Phosphatase 11/02/2021 72  38 - 126 U/L Final   Total Bilirubin 11/02/2021 1.8 (H)  0.3 - 1.2 mg/dL Final   GFR, Estimated 11/02/2021 >60  >60 mL/min Final   Comment: (NOTE) Calculated using the CKD-EPI Creatinine Equation (2021)    Anion gap 11/02/2021 10  5 - 15 Final   Performed at [facility], [place] [address], [place], [place] [address]   ABO/RH(D) 11/02/2021 O POS   Final   Antibody Screen 11/02/2021 NEG   Final   Sample Expiration 11/02/2021 11/16/2021,2359   Final   Extend sample reason 11/02/2021    Final                   Value:NO TRANSFUSIONS OR PREGNANCY IN THE PAST 3 MONTHS Performed at [place] [address], [place], [place] [address]    Prothrombin Time 11/02/2021 12.8  11.4 - 15.2 seconds Final   INR 11/02/2021 1.0  0.8 - 1.2 Final   Comment: (NOTE) INR goal varies based on device and disease states. Performed at [facility], [place] [address], [place], [place] [address]    Hgb A1c MFr Bld 11/02/2021 4.9  4.8 - 5.6 % Final   Comment: (NOTE) Pre diabetes:  5.7%-6.4%  Diabetes:              >6.4%  Glycemic control for   <7.0% adults with diabetes    Mean Plasma Glucose 11/02/2021 93.93  mg/dL Final   Performed at [facility], [place] [address], [place], [place] [address]   Glucose-Capillary 11/02/2021 98  70 - 99 mg/dL Final   Glucose reference range applies only to samples taken after fasting for at least 8 hours.     X-Rays:No results found.  EKG: Orders placed or performed during the hospital encounter of 11/02/21   EKG 12 lead per protocol   EKG 12  lead per protocol     Hospital Course: Daniel [name] is a 55 y.o. who was admitted to [facility]. They were brought to the operating room on 11/15/2021 and underwent Procedure(s): Left knee tibial versus total knee arthroplasty revision.  Patient tolerated the procedure well and was later transferred to the recovery room and then to the orthopaedic floor for postoperative care. They were given PO and IV analgesics for pain control following their surgery. They were given 24 hours of postoperative antibiotics of  Anti-infectives (From admission, onward)    Start     Dose/Rate Route Frequency Ordered Stop   11/15/21 1600  ceFAZolin (ANCEF) IVPB 2g/100 mL premix        2 g 200 mL/hr over 30 Minutes Intravenous Every 6 hours 11/15/21 1455 11/15/21 2201   11/15/21 0830  ceFAZolin (ANCEF) IVPB 2g/100 mL premix        2 g 200 mL/hr over 30 Minutes Intravenous On call to O.R. 11/15/21 4709 11/15/21 1001      and started on DVT prophylaxis in the form of Aspirin and TED hose.   PT and OT were ordered for total joint protocol. Discharge planning consulted to help with postop disposition and equipment needs.  Patient had an uneventful night on the evening of surgery. They started to get up OOB with therapy on 11/16/2021. Pt was seen during rounds and was ready to go home pending progress with therapy. He worked with therapy on POD #1 and was meeting goals. Pt was discharged to home later that day in stable condition.  Diet: Regular diet Activity: WBAT Follow-up: in two weeks Disposition: Home Discharged Condition: good   Discharge Instructions     Call MD / Call 911   Complete by: As directed    If you experience chest pain or shortness of breath, CALL 911 and be transported to the hospital emergency room.  If you develope a fever above 101 F, pus (white drainage) or increased drainage or redness at the wound, or calf pain, call your surgeon's office.   Change dressing   Complete by: As  directed    You may remove the bulky bandage (ACE wrap and gauze) two days after surgery. You will have an adhesive waterproof bandage underneath. Leave this in place until your first follow-up appointment.   Constipation Prevention   Complete by: As directed    Drink plenty of fluids.  Prune juice may be helpful.  You may use a stool softener, such as Colace (over the counter) 100 mg twice a day.  Use MiraLax (over the counter) for constipation as needed.   Diet - low sodium heart healthy   Complete by: As directed    Do not put a pillow under the knee. Place it under the heel.   Complete by: As directed  Driving restrictions   Complete by: As directed    No driving for two weeks   Post-operative opioid taper instructions:   Complete by: As directed    POST-OPERATIVE OPIOID TAPER INSTRUCTIONS: It is important to wean off of your opioid medication as soon as possible. If you do not need pain medication after your surgery it is ok to stop day one. Opioids include: Codeine, Hydrocodone(Norco, Vicodin), Oxycodone(Percocet, oxycontin) and hydromorphone amongst others.  Long term and even short term use of opiods can cause: Increased pain response Dependence Constipation Depression Respiratory depression And more.  Withdrawal symptoms can include Flu like symptoms Nausea, vomiting And more Techniques to manage these symptoms Hydrate well Eat regular healthy meals Stay active Use relaxation techniques(deep breathing, meditating, yoga) Do Not substitute Alcohol to help with tapering If you have been on opioids for less than two weeks and do not have pain than it is ok to stop all together.  Plan to wean off of opioids This plan should start within one week post op of your joint replacement. Maintain the same interval or time between taking each dose and first decrease the dose.  Cut the total daily intake of opioids by one tablet each day Next start to increase the time between  doses. The last dose that should be eliminated is the evening dose.      TED hose   Complete by: As directed    Use stockings (TED hose) for three weeks on both leg(s).  You may remove them at night for sleeping.   Weight bearing as tolerated   Complete by: As directed       Allergies as of 11/16/2021       Reactions   Levofloxacin Other (See Comments)   Tendon rupture at wrist        Medication List     STOP taking these medications    cyclobenzaprine 10 MG tablet Commonly known as: FLEXERIL   tiZANidine 4 MG tablet Commonly known as: ZANAFLEX       TAKE these medications    albuterol 108 (90 Base) MCG/ACT inhaler Commonly known as: VENTOLIN HFA Inhale 1-2 puffs into the lungs every 6 (six) hours as needed for wheezing or shortness of breath.   aspirin 325 MG EC tablet Take 1 tablet (325 mg total) by mouth 2 (two) times daily. Then take one 81 mg aspirin once a day for three weeks. Then discontinue aspirin. What changed:  medication strength how much to take when to take this additional instructions   atorvastatin 20 MG tablet Commonly known as: LIPITOR Take 1 tablet (20 mg total) by mouth at bedtime.   carvedilol 12.5 MG tablet Commonly known as: COREG TAKE 1 TABLET(12.5 MG) BY MOUTH TWICE DAILY WITH A MEAL   fish oil-omega-3 fatty acids 1000 MG capsule Take 1,000 mg by mouth daily.   gabapentin 300 MG capsule Commonly known as: NEURONTIN Take a 300 mg capsule three times a day for two weeks following surgery.Then take a 300 mg capsule two times a day for two weeks. Then take a 300 mg capsule once a day for two weeks. Then discontinue.   hydrocortisone 2.5 % cream Apply topically in the morning and at bedtime.   hydrocortisone 25 MG suppository Commonly known as: ANUSOL-HC Place 1 suppository (25 mg total) rectally daily as needed for hemorrhoids.   losartan 100 MG tablet Commonly known as: COZAAR TAKE 1 TABLET(100 MG) BY MOUTH DAILY    metFORMIN 850 MG tablet  Commonly known as: GLUCOPHAGE TAKE 1 TABLET(850 MG) BY MOUTH TWICE DAILY WITH A MEAL   methocarbamol 500 MG tablet Commonly known as: ROBAXIN Take 1 tablet (500 mg total) by mouth every 6 (six) hours as needed for muscle spasms.   multivitamin with minerals Tabs tablet Take 1 tablet by mouth daily.   OneTouch Delica Lancets 38G Misc Check blood sugars once daily   OneTouch Ultra test strip Generic drug: glucose blood TEST BLOOD SUGAR ONCE DAILY   oxyCODONE 5 MG immediate release tablet Commonly known as: Oxy IR/ROXICODONE Take 1-2 tablets (5-10 mg total) by mouth every 6 (six) hours as needed for severe pain. What changed: how much to take   pantoprazole 40 MG tablet Commonly known as: PROTONIX Take 1 tablet (40 mg total) by mouth 2 (two) times daily before a meal.   PROBIOTIC DAILY PO Take 1 tablet by mouth daily.   Red Yeast Rice 600 MG Caps Take 600 mg by mouth daily.   tadalafil 20 MG tablet Commonly known as: CIALIS Take 20 mg by mouth daily as needed for erectile dysfunction.   traMADol 50 MG tablet Commonly known as: ULTRAM Take 1-2 tablets (50-100 mg total) by mouth every 6 (six) hours as needed for moderate pain.   valACYclovir 500 MG tablet Commonly known as: VALTREX Take 1 tablet (500 mg total) by mouth daily. What changed:  when to take this reasons to take this   zolpidem 12.5 MG CR tablet Commonly known as: AMBIEN CR TAKE 1 TABLET(12.5 MG) BY MOUTH AT BEDTIME AS NEEDED FOR SLEEP What changed: See the new instructions.               Discharge Care Instructions  (From admission, onward)           Start     Ordered   11/16/21 0000  Weight bearing as tolerated        11/16/21 0751   11/16/21 0000  Change dressing       Comments: You may remove the bulky bandage (ACE wrap and gauze) two days after surgery. You will have an adhesive waterproof bandage underneath. Leave this in place until your first follow-up  appointment.   11/16/21 0751            Follow-up Information     Gaynelle Arabian, MD. Schedule an appointment as soon as possible for a visit in 2 week(s).   Specialty: Orthopedic Surgery Contact information: [address] [place] [address] [phone]                 Signed: Fenton Foy, MBA, PA-C Orthopedic Surgery 11/26/2021, 11:21 PM

## 2021-11-29 ENCOUNTER — Telehealth: Payer: Self-pay

## 2021-11-29 NOTE — Telephone Encounter (Signed)
Transition Care Management Follow-up Telephone Call Date of discharge and from where: 11/16/2021  [facility] How have you been since you were released from the hospital? "Doing well" Any questions or concerns? No  Items Reviewed: Did the pt receive and understand the discharge instructions provided? Yes  Medications obtained and verified? Yes  Other? No  Any new allergies since your discharge? No  Dietary orders reviewed? No Do you have support at home? Yes   Home Care and Equipment/Supplies: Were home health services ordered? no If so, what is the name of the agency? no Has the agency set up a time to come to the patient's home?  Were any new equipment or medical supplies ordered?  Yes What is the name of the medical supply agency? [employer] Were you able to get the supplies/equipment? yes Do you have any questions related to the use of the equipment or supplies? no  Functional Questionnaire: (I = Independent and D = Dependent) ADLs: I  Bathing/Dressing- I  Meal Prep- I  Eating- I  Maintaining continence- I  Transferring/Ambulation- walker  Managing Meds- I  Follow up appointments reviewed:  PCP Hospital f/u appt confirmed? No  Specialist Hospital f/u appt confirmed?  Scheduled to see ortho on 11/28/2021  Are transportation arrangements needed? No  If their condition worsens, is the pt aware to call PCP or go to the Emergency Dept.? Yes Was the patient provided with contact information for the PCP's office or ED? Yes Was to pt encouraged to call back with questions or concerns? Yes  Tomasa Rand, RN, BSN, CEN [facility] [employer] [phone]

## 2022-01-03 ENCOUNTER — Other Ambulatory Visit: Payer: Self-pay | Admitting: Internal Medicine

## 2022-01-05 ENCOUNTER — Other Ambulatory Visit (HOSPITAL_COMMUNITY): Payer: 59

## 2022-01-25 ENCOUNTER — Encounter: Payer: Self-pay | Admitting: Internal Medicine

## 2022-01-30 ENCOUNTER — Ambulatory Visit (INDEPENDENT_AMBULATORY_CARE_PROVIDER_SITE_OTHER): Payer: 59 | Admitting: Internal Medicine

## 2022-01-30 ENCOUNTER — Encounter: Payer: Self-pay | Admitting: Internal Medicine

## 2022-01-30 ENCOUNTER — Telehealth: Payer: Self-pay | Admitting: Internal Medicine

## 2022-01-30 VITALS — BP 138/72 | HR 68 | Temp 98.3°F | Resp 18 | Ht 70.0 in | Wt 211.4 lb

## 2022-01-30 DIAGNOSIS — E118 Type 2 diabetes mellitus with unspecified complications: Secondary | ICD-10-CM | POA: Diagnosis not present

## 2022-01-30 DIAGNOSIS — Z0000 Encounter for general adult medical examination without abnormal findings: Secondary | ICD-10-CM | POA: Diagnosis not present

## 2022-01-30 DIAGNOSIS — I10 Essential (primary) hypertension: Secondary | ICD-10-CM | POA: Diagnosis not present

## 2022-01-30 DIAGNOSIS — R7989 Other specified abnormal findings of blood chemistry: Secondary | ICD-10-CM

## 2022-01-30 DIAGNOSIS — E785 Hyperlipidemia, unspecified: Secondary | ICD-10-CM

## 2022-01-30 DIAGNOSIS — G4700 Insomnia, unspecified: Secondary | ICD-10-CM

## 2022-01-30 MED ORDER — ONETOUCH DELICA LANCETS 33G MISC: 12 refills | Status: DC

## 2022-01-30 MED ORDER — ONETOUCH ULTRA VI STRP: ORAL_STRIP | 12 refills | Status: DC

## 2022-01-30 NOTE — Progress Notes (Signed)
Subjective:    Patient ID: Daniel [name], male    DOB: 1966/01/26, 56 y.o.   MRN: [id number]  DOS:  01/30/2022 Type of visit - description: cpx  Since the last visit he is doing well. Has no major concerns. MSK: Still having pain from his injury 3 years ago, recently had a left knee revision, that is healing as expected per pt . Has developed PTSD, sees a Social worker.  Review of Systems  Other than above, a 14 point review of systems is negative       Past Medical History:  Diagnosis Date   Asthma    Depression    h/o   Diabetes mellitus    GERD (gastroesophageal reflux disease)    H/O Clostridium difficile infection 08/2015   Hyperlipidemia    Hypertension    Insomnia    Polyarthralgia 2009   blood work (-) CKs slightly elevated, bone san (-) saw rheumatology; continue w/ somptoms after holding zocor    Past Surgical History:  Procedure Laterality Date   CERVICAL SPINE SURGERY  05/31/2019   operated on C4-C5   HAND SURGERY Left 2006   HEMORRHOID SURGERY     HERNIA REPAIR  40/9811   umbilical   KNEE SURGERY Right 2011    NASAL FRACTURE SURGERY  2006   TOTAL KNEE ARTHROPLASTY Left 03/07/2020   Procedure: TOTAL KNEE ARTHROPLASTY;  Surgeon: Gaynelle Arabian, MD;  Location: [facility];  Service: Orthopedics;  Laterality: Left;  49min   TOTAL KNEE REVISION Left 11/15/2021   Procedure: Left knee tibial versus total knee arthroplasty revision;  Surgeon: Gaynelle Arabian, MD;  Location: [facility];  Service: Orthopedics;  Laterality: Left;   Social History   Socioeconomic History   Marital status: Married    Spouse name: Not on file   Number of children: 1   Years of education: Not on file   Highest education level: Not on file  Occupational History   Occupation: disable, d/t job injury 01-2019; Glass blower/designer at [employer]   Occupation: retired Firefighter  Tobacco Use   Smoking status: Never   Smokeless tobacco: Never   Tobacco comments:    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  Vaping Use   Vaping Use: Never used  Substance and Sexual Activity   Alcohol use: Yes    Alcohol/week: 5.0 standard drinks    Types: 5 Cans of beer per week    Comment: weekend   Drug use: No   Sexual activity: Not on file  Other Topics Concern   Not on file  Social History Narrative   Married, 1 adopted child   Household:pt and wife             Social Determinants of Radio broadcast assistant Strain: Not on file  Food Insecurity: Not on file  Transportation Needs: Not on file  Physical Activity: Not on file  Stress: Not on file  Social Connections: Not on file  Intimate Partner Violence: Not on file    Current Outpatient Medications  Medication Instructions   albuterol (VENTOLIN HFA) 108 (90 Base) MCG/ACT inhaler 1-2 puffs, Inhalation, Every 6 hours PRN   aspirin EC 81 mg, Oral, Daily, Swallow whole.   atorvastatin (LIPITOR) 20 mg, Oral, Daily at bedtime   carvedilol (COREG) 12.5 MG tablet TAKE 1 TABLET(12.5 MG) BY MOUTH TWICE DAILY WITH A MEAL   fish oil-omega-3 fatty acids 1,000 mg, Oral, Daily,     gabapentin (NEURONTIN) 300  MG capsule Take a 300 mg capsule three times a day for two weeks following surgery.Then take a 300 mg capsule two times a day for two weeks. Then take a 300 mg capsule once a day for two weeks. Then discontinue.   glucose blood (ONETOUCH ULTRA) test strip TEST BLOOD SUGAR TWICE DAILY   hydrocortisone (ANUSOL-HC) 25 mg, Rectal, Daily PRN   hydrocortisone 2.5 % cream Topical, 2 times daily   losartan (COZAAR) 100 MG tablet TAKE 1 TABLET(100 MG) BY MOUTH DAILY   metFORMIN (GLUCOPHAGE) 850 MG tablet TAKE 1 TABLET(850 MG) BY MOUTH TWICE DAILY WITH A MEAL   methocarbamol (ROBAXIN) 500 mg, Oral, Every 6 hours PRN   Multiple Vitamin (MULTIVITAMIN WITH MINERALS) TABS tablet 1 tablet, Oral, Daily   OneTouch Delica Lancets 77A MISC Check blood sugars twice daily   oxyCODONE (OXY IR/ROXICODONE) 5-10 mg, Oral, Every  6 hours PRN   pantoprazole (PROTONIX) 40 mg, Oral, 2 times daily before meals   Probiotic Product (PROBIOTIC DAILY PO) 1 tablet, Oral, Daily   Red Yeast Rice 600 mg, Oral, Daily   tadalafil (CIALIS) 20 mg, Oral, Daily PRN   traMADol (ULTRAM) 50-100 mg, Oral, Every 6 hours PRN   valACYclovir (VALTREX) 500 mg, Oral, Daily   zolpidem (AMBIEN CR) 12.5 MG CR tablet TAKE 1 TABLET(12.5 MG) BY MOUTH AT BEDTIME AS NEEDED FOR SLEEP       Objective:   Physical Exam BP 138/72 (BP Location: Left Arm, Patient Position: Sitting, Cuff Size: Small)    Pulse 68    Temp 98.3 F (36.8 C) (Oral)    Resp 18    Ht 5\' 10"  (1.778 m)    Wt 211 lb 6 oz (95.9 kg)    SpO2 96%    BMI 30.33 kg/m  General: Well developed, NAD, BMI noted Neck: No  thyromegaly  HEENT:  Normocephalic . Face symmetric, atraumatic Lungs:  CTA B Normal respiratory effort, no intercostal retractions, no accessory muscle use. Heart: RRR,  no murmur.  Abdomen:  Not distended, soft, non-tender. No rebound or rigidity.   Lower extremities: no pretibial edema bilaterally  Skin: Exposed areas without rash. Not pale. Not jaundice Neurologic:  alert & oriented X3.  Speech normal, gait appropriate for age.  Transferring somewhat limited by pain. Psych: Cognition and judgment appear intact.  Cooperative with normal attention span and concentration.  Behavior appropriate. No anxious or depressed appearing.     Assessment    Assessment  DM  HTN Hyperlipidemia Psych: --Anxiety, insomnia, History of Prozac intolerance "it may me like to hurt people". --PTSD (from major injury 01/2019) Asthma GERD Insomnia Polyarthralgia: w/u and rheumatology eval neg 2009, stopping zocor did not help Diarrhea, + c diff 08-2015, s/p abx  Major injury at work 01/2019, pain management per Ortho   PLAN Here for CPX DM: Currently on metformin, last A1c less than 5.  Rechecking. Consider stop metformin. HTN: BP is very good, check BMP, continue  carvedilol, losartan. Hyperlipidemia: On Lipitor.  Check FLP. PTSD: Developed after major injury 2020, sees a Social worker.  Feeling okay emotionally. Pain management: Done elsewhere. RTC 6 months    This visit occurred during the SARS-CoV-2 public health emergency.  Safety protocols were in place, including screening questions prior to the visit, additional usage of staff PPE, and extensive cleaning of exam room while observing appropriate contact time as indicated for disinfecting solutions.

## 2022-01-30 NOTE — Telephone Encounter (Signed)
Requesting: Ambien CR 12.5mg   Contract: under contract at pain medicine UDS: under contract at pain medicine Last Visit: 01/30/2022 Next Visit:07/30/2022 Last Refill: 10/31/2021 #30 and 3RF  Please Advise

## 2022-01-30 NOTE — Patient Instructions (Addendum)
Per our records you are due for your diabetic eye exam. Please contact your eye doctor to schedule an appointment. Please have them send copies of your office visit notes to [place]. Our fax number is [phone]. If you need a referral to an eye doctor please let us know.   Check the  blood pressure regularly BP GOAL is between 110/65 and  135/85. If it is consistently higher or lower, let me know    GO TO THE LAB : Get the blood work     [place], Organ back for a checkup in 6 months

## 2022-01-31 ENCOUNTER — Encounter: Payer: Self-pay | Admitting: Internal Medicine

## 2022-01-31 LAB — HEMOGLOBIN A1C: Hgb A1c MFr Bld: 5 % (ref 4.6–6.5)

## 2022-01-31 NOTE — Assessment & Plan Note (Signed)
Here for CPX DM: Currently on metformin, last A1c less than 5.  Rechecking. Consider stop metformin. HTN: BP is very good, check BMP, continue carvedilol, losartan. Hyperlipidemia: On Lipitor.  Check FLP. PTSD: Developed after major injury 2020, sees a Social worker.  Feeling okay emotionally. Pain management: Done elsewhere. RTC 6 months

## 2022-01-31 NOTE — Telephone Encounter (Signed)
PDMP okay, Rx sent 

## 2022-01-31 NOTE — Assessment & Plan Note (Signed)
-   Td 2014 - pnm shot 2015;  prevnar 2017 - s/p Shingrix  -COVID VAX: last vax 08-2021 - had a flu shot -CCS: Colonoscopy 09-2014, benign polyps, 10 years -Prostate cancer screening: Sees urology. -Diet and exercise discussed  -Labs: Reviewed, will get a BMP-FLP-A1c - ACP info provided.

## 2022-02-01 LAB — BASIC METABOLIC PANEL
BUN: 20 mg/dL (ref 6–23)
CO2: 31 mEq/L (ref 19–32)
Calcium: 9.4 mg/dL (ref 8.4–10.5)
Chloride: 102 mEq/L (ref 96–112)
Creatinine, Ser: 1.52 mg/dL — ABNORMAL HIGH (ref 0.40–1.50)
GFR: 50.48 mL/min — ABNORMAL LOW (ref >60.00)
Glucose, Bld: 79 mg/dL (ref 70–99)
Potassium: 4.1 mEq/L (ref 3.5–5.1)
Sodium: 140 mEq/L (ref 135–145)

## 2022-02-01 LAB — LIPID PANEL
Cholesterol: 128 mg/dL (ref 0–200)
HDL: 41.1 mg/dL (ref >39.00)
LDL Cholesterol: 51 mg/dL (ref 0–99)
NonHDL: 87.05
Total CHOL/HDL Ratio: 3
Triglycerides: 180 mg/dL — ABNORMAL HIGH (ref 0.0–149.0)
VLDL: 36 mg/dL (ref 0.0–40.0)

## 2022-02-01 LAB — PSA: PSA: 0.71

## 2022-02-05 MED ORDER — METFORMIN HCL 500 MG PO TABS: 500.0000 mg | ORAL_TABLET | Freq: Two times a day (BID) | ORAL | 1 refills | Status: DC

## 2022-02-05 NOTE — Addendum Note (Signed)
Addended byDamita Dunnings D on: 02/05/2022 03:16 PM   Modules accepted: Orders

## 2022-02-09 ENCOUNTER — Encounter: Payer: Self-pay | Admitting: Internal Medicine

## 2022-03-27 ENCOUNTER — Other Ambulatory Visit: Payer: Self-pay | Admitting: Internal Medicine

## 2022-03-29 ENCOUNTER — Other Ambulatory Visit: Payer: Self-pay | Admitting: Internal Medicine

## 2022-04-17 LAB — HM DIABETES EYE EXAM

## 2022-04-23 ENCOUNTER — Encounter: Payer: Self-pay | Admitting: Internal Medicine

## 2022-04-24 ENCOUNTER — Telehealth: Payer: Self-pay | Admitting: Internal Medicine

## 2022-04-24 DIAGNOSIS — G4700 Insomnia, unspecified: Secondary | ICD-10-CM

## 2022-04-24 NOTE — Telephone Encounter (Signed)
Requesting: Ambien CR 12.'5mg'$   ?Contract: Under contract with pain medicine ?UDS: None ?Last Visit: 01/30/22 ?Next Visit: 07/30/22 ?Last Refill: 01/31/22 #30 and 2RF ? ?Please Advise ? ?

## 2022-04-24 NOTE — Telephone Encounter (Signed)
PDMP okay Rx sent ?

## 2022-04-26 ENCOUNTER — Encounter: Payer: Self-pay | Admitting: Internal Medicine

## 2022-05-02 ENCOUNTER — Other Ambulatory Visit (INDEPENDENT_AMBULATORY_CARE_PROVIDER_SITE_OTHER): Payer: 59

## 2022-05-02 DIAGNOSIS — R7989 Other specified abnormal findings of blood chemistry: Secondary | ICD-10-CM

## 2022-05-03 LAB — BASIC METABOLIC PANEL
BUN: 17 mg/dL (ref 6–23)
CO2: 24 mEq/L (ref 19–32)
Calcium: 9.2 mg/dL (ref 8.4–10.5)
Chloride: 105 mEq/L (ref 96–112)
Creatinine, Ser: 1.72 mg/dL — ABNORMAL HIGH (ref 0.40–1.50)
GFR: 43.44 mL/min — ABNORMAL LOW (ref >60.00)
Glucose, Bld: 130 mg/dL — ABNORMAL HIGH (ref 70–99)
Potassium: 4.1 mEq/L (ref 3.5–5.1)
Sodium: 139 mEq/L (ref 135–145)

## 2022-05-05 ENCOUNTER — Telehealth: Payer: Self-pay | Admitting: Internal Medicine

## 2022-05-05 DIAGNOSIS — N289 Disorder of kidney and ureter, unspecified: Secondary | ICD-10-CM

## 2022-05-05 NOTE — Telephone Encounter (Signed)
(  h/o edema w/  amlodipine per chart review) Please call patient, Kidney function continue decreasing. Decrease losartan to 50 mg  Renal ultrasound DX CKD Also recommend good hydration and avoid any and all NSAIDs. Arrange office visit with me in 6 weeks.

## 2022-05-07 MED ORDER — LOSARTAN POTASSIUM 50 MG PO TABS: 50.0000 mg | ORAL_TABLET | Freq: Every day | ORAL | 0 refills | Status: DC

## 2022-05-07 NOTE — Telephone Encounter (Signed)
Spoke w/ Pt- informed of results and recommendations. Pt admits to frequent ibuprofen- he will quit. Telephone number to radiology to schedule renal ultrasound sent to Pt via Mychart, offered to schedule 6 week f/u but Pt states he will call back later to do that. Losartan '50mg'$  sent to [facility].

## 2022-05-09 ENCOUNTER — Ambulatory Visit (HOSPITAL_BASED_OUTPATIENT_CLINIC_OR_DEPARTMENT_OTHER)
Admission: RE | Admit: 2022-05-09 | Discharge: 2022-05-09 | Disposition: A | Discharge status: Home / Self Care | Payer: 59 | Source: Ambulatory Visit | Attending: Internal Medicine | Admitting: Internal Medicine

## 2022-05-09 DIAGNOSIS — N289 Disorder of kidney and ureter, unspecified: Secondary | ICD-10-CM | POA: Diagnosis present

## 2022-05-25 ENCOUNTER — Other Ambulatory Visit: Payer: Self-pay | Admitting: Internal Medicine

## 2022-07-22 ENCOUNTER — Telehealth: Payer: Self-pay | Admitting: Internal Medicine

## 2022-07-22 DIAGNOSIS — G4700 Insomnia, unspecified: Secondary | ICD-10-CM

## 2022-07-23 NOTE — Telephone Encounter (Signed)
PDMP okay, Rx sent 

## 2022-07-23 NOTE — Telephone Encounter (Signed)
Requesting: Ambien CR 12.'5mg'$   Contract: Under contract w/ pain medicine UDS: None  Last Visit: 01/30/22 Next Visit:  07/30/22 Last Refill: 04/24/22 #30 and 2RF  Please Advise

## 2022-07-30 ENCOUNTER — Ambulatory Visit: Payer: 59 | Admitting: Internal Medicine

## 2022-07-30 ENCOUNTER — Encounter: Payer: Self-pay | Admitting: Internal Medicine

## 2022-07-30 VITALS — BP 126/74 | HR 71 | Temp 98.1°F | Resp 16 | Ht 70.0 in | Wt 212.0 lb

## 2022-07-30 DIAGNOSIS — E118 Type 2 diabetes mellitus with unspecified complications: Secondary | ICD-10-CM | POA: Diagnosis not present

## 2022-07-30 DIAGNOSIS — I10 Essential (primary) hypertension: Secondary | ICD-10-CM | POA: Diagnosis not present

## 2022-07-30 NOTE — Assessment & Plan Note (Signed)
Increased creatinine Since the last visit, creatinine increased. Was rec to decrease losartan to 50 mg, renal US 05/09/2022 was WNL, avoid NSAIDs which he was taking.  We are rechecking labs today. DM: On metformin, last A1c 5.0, no ambulatory CBGs, will check A1c, consider adjust meds. HTN: Since the last visit, Losartan was decreased to 50 mg, also takes carvedilol, BP today is very good, good ambulatory BPs.  Check a CMP and CBC.  Further advised with results. Chronic back pain left knee pain: Follow-up by other physicians. Social: Applying to Social Security disability due to chronic pain and PTSD.  Also applied to [place] benefits d/t PTSD. Likely I  will be getting some paperwork.  RTC 6 months CPX

## 2022-07-30 NOTE — Progress Notes (Signed)
Subjective:    Patient ID: Daniel [name], male    DOB: 10/30/1966, 56 y.o.   MRN: [id number]  DOS:  07/30/2022 Type of visit - description: f/u  Since the last office visit, creatinine was elevated, chart is reviewed. He continue with chronic back pain and left knee pain.  Review of Systems See above   Past Medical History:  Diagnosis Date   Asthma    Depression    h/o   Diabetes mellitus    GERD (gastroesophageal reflux disease)    H/O Clostridium difficile infection 08/2015   Hyperlipidemia    Hypertension    Insomnia    Polyarthralgia 2009   blood work (-) CKs slightly elevated, bone san (-) saw rheumatology; continue w/ somptoms after holding zocor    Past Surgical History:  Procedure Laterality Date   CERVICAL SPINE SURGERY  05/31/2019   operated on C4-C5   HAND SURGERY Left 2006   HEMORRHOID SURGERY     HERNIA REPAIR  60/1093   umbilical   KNEE SURGERY Right 2011    NASAL FRACTURE SURGERY  2006   TOTAL KNEE ARTHROPLASTY Left 03/07/2020   Procedure: TOTAL KNEE ARTHROPLASTY;  Surgeon: Gaynelle Arabian, MD;  Location: [facility];  Service: Orthopedics;  Laterality: Left;  65mn   TOTAL KNEE REVISION Left 11/15/2021   Procedure: Left knee tibial versus total knee arthroplasty revision;  Surgeon: AGaynelle Arabian MD;  Location: [facility];  Service: Orthopedics;  Laterality: Left;    Current Outpatient Medications  Medication Instructions   albuterol (VENTOLIN HFA) 108 (90 Base) MCG/ACT inhaler 1-2 puffs, Inhalation, Every 6 hours PRN   aspirin EC 81 mg, Oral, Daily, Swallow whole.   atorvastatin (LIPITOR) 20 MG tablet TAKE 1 TABLET(20 MG) BY MOUTH AT BEDTIME   carvedilol (COREG) 12.5 MG tablet TAKE 1 TABLET(12.5 MG) BY MOUTH TWICE DAILY WITH A MEAL   fish oil-omega-3 fatty acids 1,000 mg, Oral, Daily,     gabapentin (NEURONTIN) 300 MG capsule Take a 300 mg capsule three times a day for two weeks following surgery.Then take a 300 mg capsule two times a day for two weeks.  Then take a 300 mg capsule once a day for two weeks. Then discontinue.   HYDROcodone-acetaminophen (NORCO) 10-325 MG tablet 1 tablet, Oral, Every 6 hours PRN   hydrocortisone (ANUSOL-HC) 25 mg, Rectal, Daily PRN   hydrocortisone 2.5 % cream Topical, 2 times daily   losartan (COZAAR) 50 mg, Oral, Daily   metFORMIN (GLUCOPHAGE) 500 mg, Oral, 2 times daily with meals   methocarbamol (ROBAXIN) 500 mg, Oral, Every 6 hours PRN   Multiple Vitamin (MULTIVITAMIN WITH MINERALS) TABS tablet 1 tablet, Oral, Daily   OneTouch Delica Lancets 323FMISC Check blood sugars twice daily   ONETOUCH ULTRA test strip TEST BLOOD SUGAR TWICE DAILY.   oxyCODONE (OXY IR/ROXICODONE) 5-10 mg, Oral, Every 6 hours PRN   pantoprazole (PROTONIX) 40 MG tablet TAKE 1 TABLET(40 MG) BY MOUTH TWICE DAILY BEFORE A MEAL   Probiotic Product (PROBIOTIC DAILY PO) 1 tablet, Oral, Daily   Red Yeast Rice 600 mg, Oral, Daily   tadalafil (CIALIS) 20 mg, Oral, Daily PRN   traMADol (ULTRAM) 50-100 mg, Oral, Every 6 hours PRN   valACYclovir (VALTREX) 500 MG tablet TAKE 1 TABLET(500 MG) BY MOUTH DAILY   zolpidem (AMBIEN CR) 12.5 MG CR tablet TAKE 1 TABLET(12.5 MG) BY MOUTH AT BEDTIME AS NEEDED FOR SLEEP       Objective:   Physical Exam BP 126/74  Pulse 71   Temp 98.1 F (36.7 C) (Oral)   Resp 16   Ht '5\' 10"'$  (1.778 m)   Wt 212 lb (96.2 kg)   SpO2 97%   BMI 30.42 kg/m  General:   Well developed, NAD, BMI noted. HEENT:  Normocephalic . Face symmetric, atraumatic Lungs:  CTA B Normal respiratory effort, no intercostal retractions, no accessory muscle use. Heart: RRR,  no murmur.  Lower extremities: Mild L pretibial pitting edema Skin: Not pale. Not jaundice Neurologic:  alert & oriented X3.  Speech normal, gait appropriate for age and unassisted Psych--  Cognition and judgment appear intact.  Cooperative with normal attention span and concentration.  Behavior appropriate. No anxious or depressed appearing.       Assessment     Assessment  DM  HTN Hyperlipidemia Psych: --Anxiety, insomnia, History of Prozac intolerance "it may me like to hurt people". --PTSD (from major injury 01/2019) Asthma GERD Insomnia Polyarthralgia: w/u and rheumatology eval neg 2009, stopping zocor did not help Diarrhea, + c diff 08-2015, s/p abx  Major injury at work 01/2019, pain management per Ortho   PLAN Increased creatinine Since the last visit, creatinine increased. Was rec to decrease losartan to 50 mg, renal US 05/09/2022 was WNL, avoid NSAIDs which he was taking.  We are rechecking labs today. DM: On metformin, last A1c 5.0, no ambulatory CBGs, will check A1c, consider adjust meds. HTN: Since the last visit, Losartan was decreased to 50 mg, also takes carvedilol, BP today is very good, good ambulatory BPs.  Check a CMP and CBC.  Further advised with results. Chronic back pain left knee pain: Follow-up by other physicians. Social: Applying to Social Security disability due to chronic pain and PTSD.  Also applied to [place] benefits d/t PTSD. Likely I  will be getting some paperwork.  RTC 6 months CPX

## 2022-07-30 NOTE — Patient Instructions (Addendum)
Check the  blood pressure regularly BP GOAL is between 110/65 and  135/85. If it is consistently higher or lower, let me know      GO TO THE LAB : Get the blood work     GO TO THE FRONT DESK, PLEASE SCHEDULE YOUR APPOINTMENTS Come back for   a physical exam in 6 months 

## 2022-07-31 LAB — CBC WITH DIFFERENTIAL/PLATELET
Basophils Absolute: 0.1 10*3/uL (ref 0.0–0.1)
Basophils Relative: 1.2 % (ref 0.0–3.0)
Eosinophils Absolute: 0.6 10*3/uL (ref 0.0–0.7)
Eosinophils Relative: 8.9 % — ABNORMAL HIGH (ref 0.0–5.0)
HCT: 42.9 % (ref 39.0–52.0)
Hemoglobin: 15.1 g/dL (ref 13.0–17.0)
Lymphocytes Relative: 39.5 % (ref 12.0–46.0)
Lymphs Abs: 2.5 10*3/uL (ref 0.7–4.0)
MCHC: 35.1 g/dL (ref 30.0–36.0)
MCV: 89.4 fl (ref 78.0–100.0)
Monocytes Absolute: 0.4 10*3/uL (ref 0.1–1.0)
Monocytes Relative: 6.4 % (ref 3.0–12.0)
Neutro Abs: 2.8 10*3/uL (ref 1.4–7.7)
Neutrophils Relative %: 44 % (ref 43.0–77.0)
Platelets: 140 10*3/uL — ABNORMAL LOW (ref 150.0–400.0)
RBC: 4.79 Mil/uL (ref 4.22–5.81)
RDW: 13.5 % (ref 11.5–15.5)
WBC: 6.3 10*3/uL (ref 4.0–10.5)

## 2022-07-31 LAB — COMPREHENSIVE METABOLIC PANEL
ALT: 18 U/L (ref 0–53)
AST: 16 U/L (ref 0–37)
Albumin: 4.5 g/dL (ref 3.5–5.2)
Alkaline Phosphatase: 96 U/L (ref 39–117)
BUN: 12 mg/dL (ref 6–23)
CO2: 26 mEq/L (ref 19–32)
Calcium: 9.6 mg/dL (ref 8.4–10.5)
Chloride: 102 mEq/L (ref 96–112)
Creatinine, Ser: 1.26 mg/dL (ref 0.40–1.50)
GFR: 63 mL/min (ref >60.00)
Glucose, Bld: 132 mg/dL — ABNORMAL HIGH (ref 70–99)
Potassium: 4.1 mEq/L (ref 3.5–5.1)
Sodium: 138 mEq/L (ref 135–145)
Total Bilirubin: 1.3 mg/dL — ABNORMAL HIGH (ref 0.2–1.2)
Total Protein: 7 g/dL (ref 6.0–8.3)

## 2022-07-31 LAB — HEMOGLOBIN A1C: Hgb A1c MFr Bld: 5.8 % (ref 4.6–6.5)

## 2022-08-20 ENCOUNTER — Other Ambulatory Visit: Payer: Self-pay | Admitting: Internal Medicine

## 2022-08-21 ENCOUNTER — Other Ambulatory Visit: Payer: Self-pay | Admitting: Internal Medicine

## 2022-09-18 ENCOUNTER — Other Ambulatory Visit: Payer: Self-pay | Admitting: Internal Medicine

## 2022-11-16 ENCOUNTER — Other Ambulatory Visit: Payer: Self-pay | Admitting: Internal Medicine

## 2022-11-16 DIAGNOSIS — G4700 Insomnia, unspecified: Secondary | ICD-10-CM

## 2022-12-15 ENCOUNTER — Telehealth: Payer: Self-pay | Admitting: Family Medicine

## 2022-12-15 DIAGNOSIS — G4700 Insomnia, unspecified: Secondary | ICD-10-CM

## 2022-12-18 ENCOUNTER — Encounter: Payer: Self-pay | Admitting: Internal Medicine

## 2022-12-18 ENCOUNTER — Other Ambulatory Visit: Payer: Self-pay | Admitting: Family Medicine

## 2022-12-18 DIAGNOSIS — G4700 Insomnia, unspecified: Secondary | ICD-10-CM

## 2022-12-18 NOTE — Addendum Note (Signed)
Addended byDamita Dunnings D on: 12/18/2022 03:43 PM   Modules accepted: Orders

## 2022-12-18 NOTE — Telephone Encounter (Signed)
[name] accidentally printed Rx for Ambien 12.'5mg'$  today. Can you send please?

## 2022-12-19 MED ORDER — ZOLPIDEM TARTRATE ER 12.5 MG PO TBCR: 12.5000 mg | EXTENDED_RELEASE_TABLET | Freq: Every evening | ORAL | 3 refills | Status: DC | PRN

## 2022-12-19 NOTE — Telephone Encounter (Signed)
PDMP okay, Rx sent 

## 2022-12-19 NOTE — Telephone Encounter (Signed)
See Rx encounter, sent to [facility].

## 2022-12-19 NOTE — Telephone Encounter (Signed)
Initial Comment Caller states his pharmacy told him that office denied his Rx of Zolpidem and he needs to pick up a paper prescription. His has one pill left for tonight. Translation No Nurse Assessment Nurse: Jeneen Rinks, RN, Janett Billow Date/Time Eilene Ghazi Time): 12/19/2022 2:00:04 AM Please select the assessment type ---Refill Additional Documentation ---Caller states his pharmacy told him that office denied his Rx of Zolpidem 12.'5mg'$  ER and he needs to pick up a paper prescription. His has one pill left for tonight. [facility] [address] in [place] Does the patient have enough medication to last until the office opens? ---Yes Additional Documentation ---advised caller to call office when they open in AM Disp. Time Eilene Ghazi Time) Disposition Final User 12/18/2022 6:03:49 PM Send To Nurse Lonia Farber, RN, Greg 12/19/2022 2:02:01 AM Clinical Call Yes Jeneen Rinks RN, Janett Billow Final Disposition 12/19/2022 2:02:01 AM Clinical Call Yes Jeneen Rinks, RN, Janett Billow

## 2022-12-19 NOTE — Addendum Note (Signed)
Addended by: Kathlene November E on: 12/19/2022 10:01 AM   Modules accepted: Orders

## 2023-01-24 LAB — PSA: PSA: 0.83

## 2023-02-01 ENCOUNTER — Encounter: Payer: Self-pay | Admitting: Internal Medicine

## 2023-02-01 ENCOUNTER — Encounter: Payer: 59 | Admitting: Internal Medicine

## 2023-02-18 ENCOUNTER — Other Ambulatory Visit: Payer: Self-pay | Admitting: Internal Medicine

## 2023-02-20 ENCOUNTER — Encounter: Payer: 59 | Admitting: Internal Medicine

## 2023-02-28 ENCOUNTER — Other Ambulatory Visit: Payer: Self-pay | Admitting: Internal Medicine

## 2023-03-01 ENCOUNTER — Ambulatory Visit (INDEPENDENT_AMBULATORY_CARE_PROVIDER_SITE_OTHER): Payer: 59 | Admitting: Internal Medicine

## 2023-03-01 ENCOUNTER — Encounter: Payer: Self-pay | Admitting: Internal Medicine

## 2023-03-01 VITALS — BP 132/68 | HR 78 | Temp 98.0°F | Resp 18 | Ht 70.0 in | Wt 205.4 lb

## 2023-03-01 DIAGNOSIS — E785 Hyperlipidemia, unspecified: Secondary | ICD-10-CM

## 2023-03-01 DIAGNOSIS — I10 Essential (primary) hypertension: Secondary | ICD-10-CM | POA: Diagnosis not present

## 2023-03-01 DIAGNOSIS — K649 Unspecified hemorrhoids: Secondary | ICD-10-CM

## 2023-03-01 DIAGNOSIS — L80 Vitiligo: Secondary | ICD-10-CM

## 2023-03-01 DIAGNOSIS — E118 Type 2 diabetes mellitus with unspecified complications: Secondary | ICD-10-CM

## 2023-03-01 DIAGNOSIS — Z0001 Encounter for general adult medical examination with abnormal findings: Secondary | ICD-10-CM

## 2023-03-01 DIAGNOSIS — Z0000 Encounter for general adult medical examination without abnormal findings: Secondary | ICD-10-CM

## 2023-03-01 MED ORDER — ALBUTEROL SULFATE HFA 108 (90 BASE) MCG/ACT IN AERS: 1.0000 | INHALATION_SPRAY | Freq: Four times a day (QID) | RESPIRATORY_TRACT | 5 refills | Status: DC | PRN

## 2023-03-01 NOTE — Progress Notes (Unsigned)
Subjective:    Patient ID: Daniel [name], male    DOB: September 21, 1966, 57 y.o.   MRN: [id number]  DOS:  03/01/2023 Type of visit - description: CPX  Here for CPX Chronic medical issues reviewed. Also for the last 6 months has noted a discoloration around the sides of the mouth. Has a history of hemorrhoids, in the last few months she has been experiencing pain, burning, he feels like something is "hanging out" from time to time he sees blood when he wipes.  The stools are otherwise normal. Still having problems with the knee that was replaced.   Review of Systems See above   Past Medical History:  Diagnosis Date   Asthma    Depression    h/o   Diabetes mellitus    GERD (gastroesophageal reflux disease)    H/O Clostridium difficile infection 08/2015   Hyperlipidemia    Hypertension    Insomnia    Polyarthralgia 2009   blood work (-) CKs slightly elevated, bone san (-) saw rheumatology; continue w/ somptoms after holding zocor    Past Surgical History:  Procedure Laterality Date   CERVICAL SPINE SURGERY  05/31/2019   operated on C4-C5   HAND SURGERY Left 2006   HEMORRHOID SURGERY     HERNIA REPAIR  [id number]   umbilical   KNEE SURGERY Right 2011    NASAL FRACTURE SURGERY  2006   TOTAL KNEE ARTHROPLASTY Left 03/07/2020   Procedure: TOTAL KNEE ARTHROPLASTY;  Surgeon: Gaynelle Arabian, MD;  Location: [facility];  Service: Orthopedics;  Laterality: Left;  71min   TOTAL KNEE REVISION Left 11/15/2021   Procedure: Left knee tibial versus total knee arthroplasty revision;  Surgeon: Gaynelle Arabian, MD;  Location: [facility];  Service: Orthopedics;  Laterality: Left;    Current Outpatient Medications  Medication Instructions   albuterol (VENTOLIN HFA) 108 (90 Base) MCG/ACT inhaler 1-2 puffs, Inhalation, Every 6 hours PRN   aspirin EC 81 mg, Oral, Daily, Swallow whole.   atorvastatin (LIPITOR) 20 mg, Oral, Daily at bedtime   carvedilol (COREG) 12.5 mg, Oral, 2 times daily with meals   fish  oil-omega-3 fatty acids 1,000 mg, Oral, Daily,     gabapentin (NEURONTIN) 300 MG capsule Take a 300 mg capsule three times a day for two weeks following surgery.Then take a 300 mg capsule two times a day for two weeks. Then take a 300 mg capsule once a day for two weeks. Then discontinue.   HYDROcodone-acetaminophen (NORCO) 10-325 MG tablet 1 tablet, Oral, Every 6 hours PRN   hydrocortisone (ANUSOL-HC) 25 mg, Rectal, Daily PRN   hydrocortisone 2.5 % cream Topical, 2 times daily   Lancets (ONETOUCH DELICA PLUS 123XX123) MISC CHECK BLOOD SUGARS TWICE DAILY   losartan (COZAAR) 50 MG tablet TAKE 1 TABLET(50 MG) BY MOUTH DAILY   metFORMIN (GLUCOPHAGE) 500 MG tablet TAKE 1 TABLET(500 MG) BY MOUTH TWICE DAILY WITH A MEAL   methocarbamol (ROBAXIN) 500 mg, Oral, Every 6 hours PRN   Multiple Vitamin (MULTIVITAMIN WITH MINERALS) TABS tablet 1 tablet, Oral, Daily   ONETOUCH ULTRA test strip TEST BLOOD SUGAR TWICE DAILY.   oxyCODONE (OXY IR/ROXICODONE) 5-10 mg, Oral, Every 6 hours PRN   pantoprazole (PROTONIX) 40 mg, Oral, 2 times daily before meals   Probiotic Product (PROBIOTIC DAILY PO) 1 tablet, Oral, Daily   Red Yeast Rice 600 mg, Oral, Daily   tadalafil (CIALIS) 20 mg, Oral, Daily PRN   traMADol (ULTRAM) 50-100 mg, Oral, Every 6 hours PRN  valACYclovir (VALTREX) 500 mg, Oral, Daily   zolpidem (AMBIEN CR) 12.5 mg, Oral, At bedtime PRN       Objective:   Physical Exam BP 132/68   Pulse 78   Temp 98 F (36.7 C) (Oral)   Resp 18   Ht 5\' 10"  (1.778 m)   Wt 205 lb 6 oz (93.2 kg)   SpO2 96%   BMI 29.47 kg/m  General: Well developed, NAD, BMI noted Neck: No  thyromegaly  HEENT:  Normocephalic . Face symmetric, atraumatic Lungs:  CTA B Normal respiratory effort, no intercostal retractions, no accessory muscle use. Heart: RRR,  no murmur.  Abdomen:  Not distended, soft, non-tender. No rebound or rigidity.   Lower extremities: no pretibial edema bilaterally Ano rectal: On symptoms  inspection he has a large soft skin tag right from the anus. Anoscopy: With the patient permission I introduced lubricated self lighted anoscope, he has multiple hemorrhoids.  No polyps seen.  Skin: Has a couple of spots of macular hypopigmentation at the corner of the mouth bilaterally. Neurologic:  alert & oriented X3.  Speech normal, gait appropriate for age and unassisted Strength symmetric and appropriate for age.  Psych: Cognition and judgment appear intact.  Cooperative with normal attention span and concentration.  Behavior appropriate. No anxious or depressed appearing.     Assessment      Assessment  DM  HTN Hyperlipidemia Psych: --Anxiety, insomnia, History of Prozac intolerance "it may me like to hurt people". --PTSD (from major injury 01/2019) Asthma GERD Insomnia Hemorrhoids: History of surgery Polyarthralgia: w/u and rheumatology eval neg 2009, stopping zocor did not help Diarrhea, + c diff 08-2015, s/p abx  Major injury at work 01/2019, pain management per Ortho   PLAN Here for CPX DM: On metformin, check A1c and micro  HTN: BP today is very good, rec ambulatory BPs, continue carvedilol-losartan.  Labs. High cholesterol: On atorvastatin.  Labs MSK: Major injury 4 years ago, still having issues.  Follow-up closely elsewhere.  Trying to take the least amount of pain medications. Vitiligo?  Referred to dermatology Asthma: Refill albuterol Hemorrhoids: He is failing conservative treatment, would like another opinion, history of previous surgery.  Referred to general surgery. RTC 6 months  - Td 12- 2014 - pnm shot 2015;  prevnar 2017 - s/p Shingrix  -COVID VAX: utd  - had a flu shot  -CCS: Colonoscopy 09-2014, benign polyps, 10 years  -Prostate cancer screening: Saw urology 01-2023.  They are checking PSAs. -Diet and exercise discussed  -Labs:  CMP FLP CBC A1c micro - ACP info provided.   === Increased creatinine Since the last visit, creatinine  increased. Was rec to decrease losartan to 50 mg, renal US 05/09/2022 was WNL, avoid NSAIDs which he was taking.  We are rechecking labs today. DM: On metformin, last A1c 5.0, no ambulatory CBGs, will check A1c, consider adjust meds. HTN: Since the last visit, Losartan was decreased to 50 mg, also takes carvedilol, BP today is very good, good ambulatory BPs.  Check a CMP and CBC.  Further advised with results. Chronic back pain left knee pain: Follow-up by other physicians. Social: Applying to Social Security disability due to chronic pain and PTSD.  Also applied to [place] benefits d/t PTSD. Likely I  will be getting some paperwork.  RTC 6 months CPX

## 2023-03-01 NOTE — Patient Instructions (Addendum)
  Referral: Dermatology General surgey   Check the  blood pressure regularly BP GOAL is between 110/65 and  135/85. If it is consistently higher or lower, let me know     GO TO THE LAB : Get the blood work     [place], [place] back for a check up in 6 months       "[place] of attorney" ,  "Living will" (Advance care planning documents)  If you already have a living will or healthcare power of attorney, is recommended you bring the copy to be scanned in your chart.   The document will be available to all the doctors you see in the system.  Advance care planning is a process that supports adults in  understanding and sharing their preferences regarding future medical care.  [name] preferences are recorded in documents called Advance Directives and the can be modified at any time while the patient is in full mental capacity.   If you don't have one, please consider create one.      More information at: [email or web address]     "[place] of attorney" ,  "Living will" (Advance care planning documents)  If you already have a living will or healthcare power of attorney, is recommended you bring the copy to be scanned in your chart.   The document will be available to all the doctors you see in the system.  Advance care planning is a process that supports adults in  understanding and sharing their preferences regarding future medical care.  [name] preferences are recorded in documents called Advance Directives and the can be modified at any time while the patient is in full mental capacity.   If you don't have one, please consider create one.      More information at: [email or web address]

## 2023-03-02 LAB — CBC WITH DIFFERENTIAL/PLATELET
Absolute Monocytes: 449 cells/uL (ref 200–950)
Basophils Absolute: 33 cells/uL (ref 0–200)
Basophils Relative: 0.5 %
Eosinophils Absolute: 304 cells/uL (ref 15–500)
Eosinophils Relative: 4.6 %
HCT: 43.7 % (ref 38.5–50.0)
Hemoglobin: 15.7 g/dL (ref 13.2–17.1)
Lymphs Abs: 2257 cells/uL (ref 850–3900)
MCH: 31.6 pg (ref 27.0–33.0)
MCHC: 35.9 g/dL (ref 32.0–36.0)
MCV: 87.9 fL (ref 80.0–100.0)
MPV: 10.7 fL (ref 7.5–12.5)
Monocytes Relative: 6.8 %
Neutro Abs: 3557 cells/uL (ref 1500–7800)
Neutrophils Relative %: 53.9 %
Platelets: 160 10*3/uL (ref 140–400)
RBC: 4.97 10*6/uL (ref 4.20–5.80)
RDW: 13.6 % (ref 11.0–15.0)
Total Lymphocyte: 34.2 %
WBC: 6.6 10*3/uL (ref 3.8–10.8)

## 2023-03-02 LAB — MICROALBUMIN / CREATININE URINE RATIO
Creatinine, Urine: 116 mg/dL (ref 20–320)
Microalb Creat Ratio: 7 mcg/mg creat (ref <30)
Microalb, Ur: 0.8 mg/dL

## 2023-03-02 LAB — LIPID PANEL
Cholesterol: 152 mg/dL (ref <200)
HDL: 39 mg/dL — ABNORMAL LOW (ref >=40)
LDL Cholesterol (Calc): 76 mg/dL (calc)
Non-HDL Cholesterol (Calc): 113 mg/dL (calc) (ref <130)
Total CHOL/HDL Ratio: 3.9 (calc) (ref <5.0)
Triglycerides: 309 mg/dL — ABNORMAL HIGH (ref <150)

## 2023-03-02 LAB — COMPREHENSIVE METABOLIC PANEL
AG Ratio: 1.9 (calc) (ref 1.0–2.5)
ALT: 25 U/L (ref 9–46)
AST: 14 U/L (ref 10–35)
Albumin: 4.7 g/dL (ref 3.6–5.1)
Alkaline phosphatase (APISO): 99 U/L (ref 35–144)
BUN: 12 mg/dL (ref 7–25)
CO2: 27 mmol/L (ref 20–32)
Calcium: 9.3 mg/dL (ref 8.6–10.3)
Chloride: 103 mmol/L (ref 98–110)
Creat: 1.24 mg/dL (ref 0.70–1.30)
Globulin: 2.5 g/dL (calc) (ref 1.9–3.7)
Glucose, Bld: 147 mg/dL — ABNORMAL HIGH (ref 65–99)
Potassium: 4.2 mmol/L (ref 3.5–5.3)
Sodium: 140 mmol/L (ref 135–146)
Total Bilirubin: 1.2 mg/dL (ref 0.2–1.2)
Total Protein: 7.2 g/dL (ref 6.1–8.1)

## 2023-03-02 LAB — HEMOGLOBIN A1C
Hgb A1c MFr Bld: 5.8 % of total Hgb — ABNORMAL HIGH (ref <5.7)
Mean Plasma Glucose: 120 mg/dL
eAG (mmol/L): 6.6 mmol/L

## 2023-03-03 ENCOUNTER — Encounter: Payer: Self-pay | Admitting: Internal Medicine

## 2023-03-03 NOTE — Assessment & Plan Note (Signed)
-   Td 12- 2014 - pnm shot 2015;  prevnar 2017 - s/p Shingrix  -COVID VAX: utd  - had a flu shot -CCS: Colonoscopy 09-2014, benign polyps, 10 years -Prostate cancer screening: Saw urology 01-2023.  They are checking PSAs. -Diet and exercise discussed  -Labs:  CMP FLP CBC A1c micro - ACP - see AVS

## 2023-03-03 NOTE — Assessment & Plan Note (Signed)
Here for CPX DM: On metformin, check A1c and micro  HTN: BP today is very good, rec ambulatory BPs, continue carvedilol-losartan.  Labs. High cholesterol: On atorvastatin.  Labs MSK: Major injury 4 years ago, still having issues.  Follow-up closely elsewhere.  Trying to take the least amount of pain medications. Vitiligo?  Referred to dermatology Asthma: Refill albuterol Hemorrhoids: He is failing conservative treatment, would like another opinion, history of previous surgery.  Referred to general surgery. RTC 6 months

## 2023-03-19 ENCOUNTER — Other Ambulatory Visit: Payer: Self-pay | Admitting: Internal Medicine

## 2023-04-16 ENCOUNTER — Telehealth: Payer: Self-pay | Admitting: Internal Medicine

## 2023-04-16 DIAGNOSIS — G4700 Insomnia, unspecified: Secondary | ICD-10-CM

## 2023-04-16 NOTE — Telephone Encounter (Signed)
Pdmp ok rx sent  

## 2023-04-16 NOTE — Telephone Encounter (Signed)
Requesting: Ambien 12.5mg   Contract:Under contract w/ pain medicine UDS: Under contract w/ pain medicine Last Visit: 03/01/23 Next Visit: 09/03/23 Last Refill: 12/19/22 #30 and 3RF   Please Advise

## 2023-05-16 ENCOUNTER — Telehealth: Payer: Self-pay

## 2023-05-16 ENCOUNTER — Other Ambulatory Visit (HOSPITAL_BASED_OUTPATIENT_CLINIC_OR_DEPARTMENT_OTHER): Payer: Self-pay

## 2023-05-16 DIAGNOSIS — G4700 Insomnia, unspecified: Secondary | ICD-10-CM

## 2023-05-16 MED ORDER — ZOLPIDEM TARTRATE ER 12.5 MG PO TBCR
12.5000 mg | EXTENDED_RELEASE_TABLET | Freq: Every evening | ORAL | 0 refills | Status: DC | PRN
  Filled 2023-05-16 (×2): qty 30, 30d supply, fill #0

## 2023-05-16 NOTE — Telephone Encounter (Signed)
PDMP okay, I will send a prescription to our pharmacy.  Let the patient know. If unavailable, will have to transition at least temporarily to Ambien 10 mg.

## 2023-05-16 NOTE — Telephone Encounter (Signed)
Spoke w/ Pt- informed that Ambien CR 12.5mg  was sent downstairs d/t back order. Pt verbalized understanding. He did inform me that [facility] on [address] has it in stock in case our pharmacy doesn't have it.

## 2023-05-16 NOTE — Telephone Encounter (Signed)
Received fax from [facility] on [address]- Ambien CR 12.5mg  on back order. They are requesting an alternative please.

## 2023-05-22 ENCOUNTER — Other Ambulatory Visit: Payer: Self-pay

## 2023-05-22 ENCOUNTER — Encounter (HOSPITAL_BASED_OUTPATIENT_CLINIC_OR_DEPARTMENT_OTHER): Payer: Self-pay | Admitting: General Surgery

## 2023-05-22 ENCOUNTER — Encounter (HOSPITAL_BASED_OUTPATIENT_CLINIC_OR_DEPARTMENT_OTHER)
Admission: RE | Admit: 2023-05-22 | Discharge: 2023-05-22 | Disposition: A | Discharge status: Home / Self Care | Payer: 59 | Source: Ambulatory Visit | Attending: General Surgery | Admitting: General Surgery

## 2023-05-22 DIAGNOSIS — I10 Essential (primary) hypertension: Secondary | ICD-10-CM | POA: Insufficient documentation

## 2023-05-22 DIAGNOSIS — Z01818 Encounter for other preprocedural examination: Secondary | ICD-10-CM | POA: Diagnosis present

## 2023-05-22 DIAGNOSIS — E119 Type 2 diabetes mellitus without complications: Secondary | ICD-10-CM | POA: Diagnosis not present

## 2023-05-22 LAB — BASIC METABOLIC PANEL
Anion gap: 8 (ref 5–15)
BUN: 15 mg/dL (ref 6–20)
CO2: 26 mmol/L (ref 22–32)
Calcium: 9.1 mg/dL (ref 8.9–10.3)
Chloride: 100 mmol/L (ref 98–111)
Creatinine, Ser: 1.22 mg/dL (ref 0.61–1.24)
GFR, Estimated: >60 mL/min (ref >60)
Glucose, Bld: 180 mg/dL — ABNORMAL HIGH (ref 70–99)
Potassium: 4.1 mmol/L (ref 3.5–5.1)
Sodium: 134 mmol/L — ABNORMAL LOW (ref 135–145)

## 2023-05-30 ENCOUNTER — Ambulatory Visit: Payer: Self-pay | Admitting: General Surgery

## 2023-05-30 NOTE — Anesthesia Preprocedure Evaluation (Addendum)
Anesthesia Evaluation  Patient identified by MRN, date of birth, ID band Patient awake    Reviewed: Allergy & Precautions, NPO status , Patient's Chart, lab work & pertinent test results  Airway Mallampati: II  TM Distance: >3 FB Neck ROM: Full    Dental no notable dental hx.    Pulmonary asthma    Pulmonary exam normal        Cardiovascular hypertension, Pt. on medications  Rhythm:Regular Rate:Normal     Neuro/Psych   Anxiety Depression    negative neurological ROS     GI/Hepatic Neg liver ROS,GERD  Medicated,,  Endo/Other  diabetes, Type 2, Oral Hypoglycemic Agents    Renal/GU   negative genitourinary   Musculoskeletal  (+) Arthritis , Osteoarthritis,    Abdominal Normal abdominal exam  (+)   Peds  Hematology   Anesthesia Other Findings   Reproductive/Obstetrics                             Anesthesia Physical Anesthesia Plan  ASA: 2  Anesthesia Plan: General   Post-op Pain Management: Celebrex PO (pre-op)* and Tylenol PO (pre-op)*   Induction: Intravenous  PONV Risk Score and Plan: 2 and Ondansetron, Dexamethasone, Midazolam and Treatment may vary due to age or medical condition  Airway Management Planned: Mask and LMA  Additional Equipment: None  Intra-op Plan:   Post-operative Plan: Extubation in OR  Informed Consent: I have reviewed the patients History and Physical, chart, labs and discussed the procedure including the risks, benefits and alternatives for the proposed anesthesia with the patient or authorized representative who has indicated his/her understanding and acceptance.     Dental advisory given  Plan Discussed with: CRNA  Anesthesia Plan Comments:        Anesthesia Quick Evaluation

## 2023-05-31 ENCOUNTER — Encounter (HOSPITAL_BASED_OUTPATIENT_CLINIC_OR_DEPARTMENT_OTHER)
Admission: RE | Disposition: A | Discharge status: Home / Self Care | Payer: Self-pay | Source: Home / Self Care | Attending: General Surgery

## 2023-05-31 ENCOUNTER — Encounter (HOSPITAL_BASED_OUTPATIENT_CLINIC_OR_DEPARTMENT_OTHER): Payer: Self-pay | Admitting: General Surgery

## 2023-05-31 ENCOUNTER — Ambulatory Visit (HOSPITAL_BASED_OUTPATIENT_CLINIC_OR_DEPARTMENT_OTHER): Payer: 59 | Admitting: Anesthesiology

## 2023-05-31 ENCOUNTER — Other Ambulatory Visit: Payer: Self-pay

## 2023-05-31 ENCOUNTER — Ambulatory Visit (HOSPITAL_COMMUNITY)
Admission: RE | Admit: 2023-05-31 | Discharge: 2023-05-31 | Disposition: A | Discharge status: Home / Self Care | Payer: 59 | Attending: General Surgery | Admitting: General Surgery

## 2023-05-31 DIAGNOSIS — K648 Other hemorrhoids: Secondary | ICD-10-CM | POA: Insufficient documentation

## 2023-05-31 DIAGNOSIS — K644 Residual hemorrhoidal skin tags: Secondary | ICD-10-CM | POA: Insufficient documentation

## 2023-05-31 DIAGNOSIS — I10 Essential (primary) hypertension: Secondary | ICD-10-CM | POA: Diagnosis not present

## 2023-05-31 DIAGNOSIS — E118 Type 2 diabetes mellitus with unspecified complications: Secondary | ICD-10-CM

## 2023-05-31 DIAGNOSIS — J45909 Unspecified asthma, uncomplicated: Secondary | ICD-10-CM | POA: Diagnosis not present

## 2023-05-31 DIAGNOSIS — Z01818 Encounter for other preprocedural examination: Secondary | ICD-10-CM

## 2023-05-31 HISTORY — PX: EVALUATION UNDER ANESTHESIA WITH HEMORRHOIDECTOMY: SHX5624

## 2023-05-31 HISTORY — PX: EXCISION OF SKIN TAG: SHX6270

## 2023-05-31 HISTORY — PX: HEMORRHOID SURGERY: SHX153

## 2023-05-31 LAB — GLUCOSE, CAPILLARY
Glucose-Capillary: 95 mg/dL (ref 70–99)
Glucose-Capillary: 110 mg/dL — ABNORMAL HIGH (ref 70–99)

## 2023-05-31 SURGERY — EXAM UNDER ANESTHESIA WITH HEMORRHOIDECTOMY
Anesthesia: General | Site: Rectum

## 2023-05-31 MED ORDER — LACTATED RINGERS IV SOLN: INTRAVENOUS | Status: DC

## 2023-05-31 MED ORDER — BUPIVACAINE LIPOSOME 1.3 % IJ SUSP
INTRAMUSCULAR | Status: AC
  Filled 2023-05-31: qty 40

## 2023-05-31 MED ORDER — CELECOXIB 200 MG PO CAPS
200.0000 mg | ORAL_CAPSULE | Freq: Once | ORAL | Status: AC
  Administered 2023-05-31: 200 mg via ORAL

## 2023-05-31 MED ORDER — FLEET ENEMA 7-19 GM/118ML RE ENEM
1.0000 | ENEMA | Freq: Once | RECTAL | Status: DC
  Filled 2023-05-31: qty 1

## 2023-05-31 MED ORDER — LIDOCAINE HCL 1 % IJ SOLN
INTRAMUSCULAR | Status: DC | PRN
  Administered 2023-05-31: 80 mg via INTRADERMAL

## 2023-05-31 MED ORDER — CIPROFLOXACIN-DEXAMETHASONE 0.3-0.1 % OT SUSP
OTIC | Status: AC
  Filled 2023-05-31: qty 7.5

## 2023-05-31 MED ORDER — ENSURE PRE-SURGERY PO LIQD: 296.0000 mL | Freq: Once | ORAL | Status: DC

## 2023-05-31 MED ORDER — ONDANSETRON HCL 4 MG/2ML IJ SOLN
INTRAMUSCULAR | Status: AC
  Filled 2023-05-31: qty 4

## 2023-05-31 MED ORDER — LIDOCAINE 2% (20 MG/ML) 5 ML SYRINGE
INTRAMUSCULAR | Status: AC
  Filled 2023-05-31: qty 10

## 2023-05-31 MED ORDER — FENTANYL CITRATE (PF) 100 MCG/2ML IJ SOLN
INTRAMUSCULAR | Status: DC | PRN
  Administered 2023-05-31: 50 ug via INTRAVENOUS
  Administered 2023-05-31: 25 ug via INTRAVENOUS

## 2023-05-31 MED ORDER — ONDANSETRON HCL 4 MG/2ML IJ SOLN
INTRAMUSCULAR | Status: DC | PRN
  Administered 2023-05-31: 4 mg via INTRAVENOUS

## 2023-05-31 MED ORDER — PROPOFOL 10 MG/ML IV BOLUS
INTRAVENOUS | Status: AC
  Filled 2023-05-31: qty 20

## 2023-05-31 MED ORDER — OXYCODONE HCL 5 MG PO TABS
5.0000 mg | ORAL_TABLET | Freq: Once | ORAL | Status: AC
  Administered 2023-05-31: 5 mg via ORAL

## 2023-05-31 MED ORDER — OXYCODONE HCL 5 MG PO TABS
5.0000 mg | ORAL_TABLET | Freq: Four times a day (QID) | ORAL | 0 refills | Status: AC | PRN
Start: 2023-05-31 — End: 2023-06-07

## 2023-05-31 MED ORDER — BUPIVACAINE LIPOSOME 1.3 % IJ SUSP
INTRAMUSCULAR | Status: DC | PRN
  Administered 2023-05-31: 20 mL

## 2023-05-31 MED ORDER — BUPIVACAINE HCL (PF) 0.5 % IJ SOLN
INTRAMUSCULAR | Status: AC
  Filled 2023-05-31: qty 60

## 2023-05-31 MED ORDER — FENTANYL CITRATE (PF) 100 MCG/2ML IJ SOLN: 25.0000 ug | INTRAMUSCULAR | Status: DC | PRN

## 2023-05-31 MED ORDER — OXYCODONE HCL 5 MG PO TABS
ORAL_TABLET | ORAL | Status: AC
  Filled 2023-05-31: qty 1

## 2023-05-31 MED ORDER — CELECOXIB 200 MG PO CAPS
ORAL_CAPSULE | ORAL | Status: AC
  Filled 2023-05-31: qty 1

## 2023-05-31 MED ORDER — ACETAMINOPHEN 500 MG PO TABS: 1000.0000 mg | ORAL_TABLET | Freq: Once | ORAL | Status: DC

## 2023-05-31 MED ORDER — SODIUM CHLORIDE 0.9 % IV SOLN
2.0000 g | INTRAVENOUS | Status: AC
  Administered 2023-05-31: 2 g via INTRAVENOUS
  Filled 2023-05-31: qty 2

## 2023-05-31 MED ORDER — BUPIVACAINE-EPINEPHRINE 0.25% -1:200000 IJ SOLN
INTRAMUSCULAR | Status: DC | PRN
  Administered 2023-05-31: 10 mL

## 2023-05-31 MED ORDER — ACETAMINOPHEN 500 MG PO TABS
1000.0000 mg | ORAL_TABLET | ORAL | Status: AC
  Administered 2023-05-31: 1000 mg via ORAL

## 2023-05-31 MED ORDER — BUPIVACAINE LIPOSOME 1.3 % IJ SUSP: 20.0000 mL | Freq: Once | INTRAMUSCULAR | Status: DC

## 2023-05-31 MED ORDER — DEXAMETHASONE SODIUM PHOSPHATE 10 MG/ML IJ SOLN
INTRAMUSCULAR | Status: DC | PRN
  Administered 2023-05-31: 5 mg via INTRAVENOUS

## 2023-05-31 MED ORDER — CHLORHEXIDINE GLUCONATE CLOTH 2 % EX PADS: 6.0000 | MEDICATED_PAD | Freq: Once | CUTANEOUS | Status: DC

## 2023-05-31 MED ORDER — SODIUM CHLORIDE (PF) 0.9 % IJ SOLN
INTRAMUSCULAR | Status: AC
  Filled 2023-05-31: qty 20

## 2023-05-31 MED ORDER — FENTANYL CITRATE (PF) 100 MCG/2ML IJ SOLN
INTRAMUSCULAR | Status: AC
  Filled 2023-05-31: qty 2

## 2023-05-31 MED ORDER — BUPIVACAINE HCL (PF) 0.25 % IJ SOLN
INTRAMUSCULAR | Status: AC
  Filled 2023-05-31: qty 60

## 2023-05-31 MED ORDER — BUPIVACAINE-EPINEPHRINE (PF) 0.25% -1:200000 IJ SOLN
INTRAMUSCULAR | Status: AC
  Filled 2023-05-31: qty 60

## 2023-05-31 MED ORDER — MIDAZOLAM HCL 2 MG/2ML IJ SOLN
INTRAMUSCULAR | Status: AC
  Filled 2023-05-31: qty 2

## 2023-05-31 MED ORDER — MIDAZOLAM HCL 5 MG/5ML IJ SOLN
INTRAMUSCULAR | Status: DC | PRN
  Administered 2023-05-31: 2 mg via INTRAVENOUS

## 2023-05-31 MED ORDER — ACETAMINOPHEN 500 MG PO TABS
ORAL_TABLET | ORAL | Status: AC
  Filled 2023-05-31: qty 2

## 2023-05-31 MED ORDER — PROPOFOL 10 MG/ML IV BOLUS
INTRAVENOUS | Status: DC | PRN
  Administered 2023-05-31: 150 mg via INTRAVENOUS

## 2023-05-31 SURGICAL SUPPLY — 40 items
BAG COUNTER SPONGE SURGICOUNT (BAG) ×2 IMPLANT
BAG SPNG CNTER NS LX DISP (BAG)
BRIEF MESH DISP 2XL (UNDERPADS AND DIAPERS) IMPLANT
CANISTER SUCT 1200ML W/VALVE (MISCELLANEOUS) ×2 IMPLANT
COVER MAYO STAND STRL (DRAPES) ×2 IMPLANT
COVER SURGICAL LIGHT HANDLE (MISCELLANEOUS) ×2 IMPLANT
DRAPE UTILITY XL STRL (DRAPES) ×2 IMPLANT
ELECT REM PT RETURN 9FT ADLT (ELECTROSURGICAL) ×1
ELECTRODE REM PT RTRN 9FT ADLT (ELECTROSURGICAL) ×2 IMPLANT
GAUZE 4X4 16PLY ~~LOC~~+RFID DBL (SPONGE) ×2 IMPLANT
GAUZE SPONGE 4X4 12PLY STRL (GAUZE/BANDAGES/DRESSINGS) ×2 IMPLANT
GLOVE BIO SURGEON STRL SZ8 (GLOVE) ×2 IMPLANT
GLOVE BIOGEL PI IND STRL 7.0 (GLOVE) IMPLANT
GLOVE BIOGEL PI IND STRL 7.5 (GLOVE) IMPLANT
GLOVE BIOGEL PI IND STRL 8 (GLOVE) ×2 IMPLANT
GLOVE SURG SYN 7.5 E (GLOVE) ×1 IMPLANT
GLOVE SURG SYN 7.5 PF PI (GLOVE) IMPLANT
GOWN STRL REUS W/ TWL LRG LVL3 (GOWN DISPOSABLE) ×2 IMPLANT
GOWN STRL REUS W/ TWL XL LVL3 (GOWN DISPOSABLE) ×2 IMPLANT
GOWN STRL REUS W/TWL LRG LVL3 (GOWN DISPOSABLE) ×1
GOWN STRL REUS W/TWL XL LVL3 (GOWN DISPOSABLE) ×3
KIT BASIN OR (CUSTOM PROCEDURE TRAY) ×2 IMPLANT
KIT TURNOVER KIT B (KITS) ×2 IMPLANT
NDL HYPO 22X1.5 SAFETY MO (MISCELLANEOUS) ×2 IMPLANT
NEEDLE HYPO 22X1.5 SAFETY MO (MISCELLANEOUS) ×1 IMPLANT
NS IRRIG 1000ML POUR BTL (IV SOLUTION) ×2 IMPLANT
PACK BASIN DAY SURGERY FS (CUSTOM PROCEDURE TRAY) IMPLANT
PACK LITHOTOMY IV (CUSTOM PROCEDURE TRAY) ×2 IMPLANT
PAD ARMBOARD 7.5X6 YLW CONV (MISCELLANEOUS) ×4 IMPLANT
PENCIL SMOKE EVACUATOR (MISCELLANEOUS) ×2 IMPLANT
SHEARS HARMONIC 9CM CVD (BLADE) ×2 IMPLANT
SPONGE SURGIFOAM ABS GEL 100 (HEMOSTASIS) IMPLANT
SURGILUBE 2OZ TUBE FLIPTOP (MISCELLANEOUS) ×2 IMPLANT
SUT CHROMIC 2 0 SH (SUTURE) ×2 IMPLANT
SUT CHROMIC 3 0 SH 27 (SUTURE) ×2 IMPLANT
SYR CONTROL 10ML LL (SYRINGE) ×2 IMPLANT
TOWEL GREEN STERILE FF (TOWEL DISPOSABLE) ×4 IMPLANT
TUBE CONNECTING 20X1/4 (TUBING) ×2 IMPLANT
UNDERPAD 30X36 HEAVY ABSORB (UNDERPADS AND DIAPERS) ×2 IMPLANT
YANKAUER SUCT BULB TIP NO VENT (SUCTIONS) ×2 IMPLANT

## 2023-05-31 NOTE — Anesthesia Procedure Notes (Signed)
Procedure Name: LMA Insertion Date/Time: 05/31/2023 7:37 AM  Performed by: Elliot Dally, CRNAPre-anesthesia Checklist: Patient identified, Emergency Drugs available, Suction available and Patient being monitored Patient Re-evaluated:Patient Re-evaluated prior to induction Oxygen Delivery Method: Circle System Utilized Preoxygenation: Pre-oxygenation with 100% oxygen Induction Type: IV induction Ventilation: Mask ventilation without difficulty LMA: LMA inserted LMA Size: 4.0 Number of attempts: 1 Airway Equipment and Method: Bite block Placement Confirmation: positive ETCO2 Tube secured with: Tape Dental Injury: Teeth and Oropharynx as per pre-operative assessment

## 2023-05-31 NOTE — Op Note (Signed)
  05/31/2023  8:17 AM  PATIENT:  Daniel [name]  57 y.o. male  PRE-OPERATIVE DIAGNOSIS:  INTERNAL AND EXTERNEAL HEMORRHOIDS, ANAL SKIN TAGS  POST-OPERATIVE DIAGNOSIS:  INTERNAL AND EXTERNEAL HEMORRHOIDS, ANAL SKIN TAGS  PROCEDURE:  Procedure(s): Examination under anesthesia Internal hemorrhoidectomy, single column External hemorrhoidectomy Excision of anal skin tag  SURGEON:  Surgeon(s): Violeta Gelinas, MD  ASSISTANTS: none   ANESTHESIA:   local and general  EBL:  Total I/O In: 400 [I.V.:300; IV Piggyback:100] Out: 5 [Blood:5]  BLOOD ADMINISTERED:none  DRAINS: none   SPECIMEN:  Excision  DISPOSITION OF SPECIMEN:  PATHOLOGY  COUNTS:  YES  DICTATION: .Dragon Dictation Procedure detail: Informed consent was obtained.  He received intravenous antibiotics.  He was brought to the operating room.  General anesthesia with laryngeal mask airway was administered by the anesthesia staff.  He was placed in lithotomy position.  His perianal region was prepped and draped in a sterile fashion.  Timeout procedure was performed.  I injected a mixture of Exparel & quarter percent Marcaine with epinephrine circumferentially for postoperative pain relief.  Examination under anesthesia revealed a large external hemorrhoid at the 8 o'clock position.  There was anal skin tag at the 4 o'clock position.  Rectal exam reveals a enlarged, inflamed internal hemorrhoid at the 5 o'clock position.  There were no other masses or significant internal hemorrhoids.  Attention was directed first to the internal hemorrhoid.  I incised the anoderm and then carefully dissected the hemorrhoidal tissue using the harmonic scalpel.  I excised the hemorrhoidal tissue and it was sent for pathology.  I stayed superficial to the sphincters.  The area was cauterized to get excellent hemostasis.  Attention was then directed to the large external hemorrhoid.  The anoderm was incised with a scalpel.  I then removed the external  hemorrhoid using the harmonic scalpel staying superficial to the sphincters.  This was sent to pathology.  In a similar fashion, the anal skin tag was also excised.  I incised the anoderm using a scalpel and then used the harmonic scalpel to completely remove it.  The area was irrigated.  It was cauterized to get excellent hemostasis.  I reexamined the rectum and ensured excellent hemostasis from the internal hemorrhoidectomy.  Hemostasis was excellent.  I placed an endorectal dressing of Gelfoam sponge.  All counts were correct.  He tolerated the procedure without apparent complication and was taken recovery in stable condition. PATIENT DISPOSITION:  PACU - hemodynamically stable.   Delay start of Pharmacological VTE agent (>24hrs) due to surgical blood loss or risk of bleeding:  no  Violeta Gelinas, MD, MPH, FACS Pager: [phone]  6/14/20248:17 AM

## 2023-05-31 NOTE — Transfer of Care (Signed)
Immediate Anesthesia Transfer of Care Note  Patient: Daniel [name]  Procedure(s) Performed: EXAM UNDER ANESTHESIA WITH HEMORRHOIDECTOMY (Rectum) INTERNAL AND  EXTERNAL HEMORRHOIDECTOMY (Rectum) EXCISION OF ANAL SKIN TAG (Rectum)  Patient Location: PACU  Anesthesia Type:General  Level of Consciousness: awake and alert   Airway & Oxygen Therapy: Patient Spontanous Breathing  Post-op Assessment: Report given to RN and Post -op Vital signs reviewed and stable  Post vital signs: Reviewed and stable  Last Vitals:  Vitals Value Taken Time  BP 138/83 05/31/23 0817  Temp    Pulse 83 05/31/23 0821  Resp 15 05/31/23 0821  SpO2 98 % 05/31/23 0821  Vitals shown include unvalidated device data.  Last Pain:  Vitals:   05/31/23 1610  TempSrc: Oral  PainSc: 5          Complications: No notable events documented.

## 2023-05-31 NOTE — H&P (Signed)
Daniel [name] is an 57 y.o. male.   Chief Complaint: Internal hemorrhoids, external hemorrhoids, anal skin tag HPI: Presents for internal hemorrhoidectomy, external hemorrhoidectomy, excision of anal skin tags.  His symptoms have been about the same since I saw him in the office.  Past Medical History:  Diagnosis Date   Asthma    Depression    h/o   Diabetes mellitus    GERD (gastroesophageal reflux disease)    H/O Clostridium difficile infection 08/2015   Hyperlipidemia    Hypertension    Insomnia    Polyarthralgia 2009   blood work (-) CKs slightly elevated, bone san (-) saw rheumatology; continue w/ somptoms after holding zocor    Past Surgical History:  Procedure Laterality Date   CERVICAL SPINE SURGERY  05/31/2019   operated on C4-C5   HAND SURGERY Left 2006   HEMORRHOID SURGERY     HERNIA REPAIR  05/2018   umbilical   KNEE SURGERY Right 2011    NASAL FRACTURE SURGERY  2006   TOTAL KNEE ARTHROPLASTY Left 03/07/2020   Procedure: TOTAL KNEE ARTHROPLASTY;  Surgeon: Ollen Gross, MD;  Location: [facility];  Service: Orthopedics;  Laterality: Left;    TOTAL KNEE REVISION Left 11/15/2021   Procedure: Left knee tibial versus total knee arthroplasty revision;  Surgeon: Ollen Gross, MD;  Location: [facility];  Service: Orthopedics;  Laterality: Left;    Family History  Problem Relation Age of Onset   Diabetes Mother        ??   Hypertension Mother    Diabetes Father    Prostate cancer Neg Hx    Colon cancer Neg Hx    Coronary artery disease Neg Hx    Stroke Neg Hx    Social History:  reports that he has never smoked. He has never used smokeless tobacco. He reports current alcohol use of about 6.0 standard drinks of alcohol per week. He reports that he does not use drugs.  Allergies:  Allergies  Allergen Reactions   Levofloxacin Other (See Comments)    Tendon rupture at wrist    Medications Prior to Admission  Medication Sig Dispense Refill   aspirin EC 81 MG  tablet Take 81 mg by mouth daily. Swallow whole.     atorvastatin (LIPITOR) 20 MG tablet Take 1 tablet (20 mg total) by mouth at bedtime. 90 tablet 1   carvedilol (COREG) 12.5 MG tablet Take 1 tablet (12.5 mg total) by mouth 2 (two) times daily with a meal. 180 tablet 1   fish oil-omega-3 fatty acids 1000 MG capsule Take 1,000 mg by mouth daily.     gabapentin (NEURONTIN) 300 MG capsule Take a 300 mg capsule three times a day for two weeks following surgery.Then take a 300 mg capsule two times a day for two weeks. Then take a 300 mg capsule once a day for two weeks. Then discontinue. 84 capsule 0   hydrocortisone (ANUSOL-HC) 25 MG suppository Place 1 suppository (25 mg total) rectally daily as needed for hemorrhoids. 15 suppository 1   Lancets (ONETOUCH DELICA PLUS LANCET33G) MISC CHECK BLOOD SUGARS TWICE DAILY 200 each 12   losartan (COZAAR) 50 MG tablet TAKE 1 TABLET(50 MG) BY MOUTH DAILY 90 tablet 1   metFORMIN (GLUCOPHAGE) 500 MG tablet TAKE 1 TABLET(500 MG) BY MOUTH TWICE DAILY WITH A MEAL 180 tablet 1   Multiple Vitamin (MULTIVITAMIN WITH MINERALS) TABS tablet Take 1 tablet by mouth daily.     ONETOUCH ULTRA test strip TEST  BLOOD SUGAR TWICE DAILY. 200 strip 12   pantoprazole (PROTONIX) 40 MG tablet Take 1 tablet (40 mg total) by mouth 2 (two) times daily before a meal. 180 tablet 1   Probiotic Product (PROBIOTIC DAILY PO) Take 1 tablet by mouth daily.      Red Yeast Rice 600 MG CAPS Take 600 mg by mouth daily.     tadalafil (CIALIS) 20 MG tablet Take 20 mg by mouth daily as needed for erectile dysfunction.     valACYclovir (VALTREX) 500 MG tablet Take 1 tablet (500 mg total) by mouth daily. 90 tablet 1   zolpidem (AMBIEN CR) 12.5 MG CR tablet Take 1 tablet (12.5 mg total) by mouth at bedtime as needed for sleep. 30 tablet 0   albuterol (VENTOLIN HFA) 108 (90 Base) MCG/ACT inhaler Inhale 1-2 puffs into the lungs every 6 (six) hours as needed for wheezing or shortness of breath. 18 g 5    HYDROcodone-acetaminophen (NORCO) 10-325 MG tablet Take 1 tablet by mouth every 6 (six) hours as needed for severe pain.     hydrocortisone 2.5 % cream Apply topically in the morning and at bedtime. 30 g 3   methocarbamol (ROBAXIN) 500 MG tablet Take 1 tablet (500 mg total) by mouth every 6 (six) hours as needed for muscle spasms. 40 tablet 0   oxyCODONE (OXY IR/ROXICODONE) 5 MG immediate release tablet Take 1-2 tablets (5-10 mg total) by mouth every 6 (six) hours as needed for severe pain. 42 tablet 0   traMADol (ULTRAM) 50 MG tablet Take 1-2 tablets (50-100 mg total) by mouth every 6 (six) hours as needed for moderate pain. 40 tablet 0   triamcinolone cream (KENALOG) 0.1 % Apply 1 Application topically 2 (two) times daily.      Results for orders placed or performed during the hospital encounter of 05/31/23 (from the past 48 hour(s))  Glucose, capillary     Status: None   Collection Time: 05/31/23  6:26 AM  Result Value Ref Range   Glucose-Capillary 95 70 - 99 mg/dL    Comment: Glucose reference range applies only to samples taken after fasting for at least 8 hours.   Comment 1 Notify RN    Comment 2 Document in Chart    No results found.  Review of Systems  Blood pressure (!) 158/89, pulse (!) 58, temperature 97.6 F (36.4 C), temperature source Oral, resp. rate 18, height 5\' 10"  (1.778 m), weight 93.6 kg, SpO2 100 %. Physical Exam Cardiovascular:     Rate and Rhythm: Normal rate and regular rhythm.  Pulmonary:     Effort: Pulmonary effort is normal.     Breath sounds: Normal breath sounds.  Abdominal:     General: Abdomen is flat.     Palpations: Abdomen is soft.     Tenderness: There is no abdominal tenderness.  Genitourinary:    Comments: See office note Musculoskeletal:        General: No swelling.     Cervical back: Neck supple.  Neurological:     Mental Status: He is alert and oriented to person, place, and time.  Psychiatric:        Mood and Affect: Mood normal.       Assessment/Plan Internal hemorrhoids, external hemorrhoids, anal skin tags -plan internal hemorrhoidectomy, external hemorrhoidectomy, excision of anal skin tags.  Procedure, risks, and benefits were again discussed in detail.  He is agreeable.  I discussed the expected postoperative course as well.  Liz Malady, MD  05/31/2023, 7:20 AM

## 2023-05-31 NOTE — Anesthesia Postprocedure Evaluation (Signed)
Anesthesia Post Note  Patient: Daniel [name]  Procedure(s) Performed: EXAM UNDER ANESTHESIA WITH HEMORRHOIDECTOMY (Rectum) INTERNAL AND  EXTERNAL HEMORRHOIDECTOMY (Rectum) EXCISION OF ANAL SKIN TAG (Rectum)     Patient location during evaluation: PACU Anesthesia Type: General Level of consciousness: awake and alert Pain management: pain level controlled Vital Signs Assessment: post-procedure vital signs reviewed and stable Respiratory status: spontaneous breathing, nonlabored ventilation, respiratory function stable and patient connected to nasal cannula oxygen Cardiovascular status: blood pressure returned to baseline and stable Postop Assessment: no apparent nausea or vomiting Anesthetic complications: no   No notable events documented.  Last Vitals:  Vitals:   05/31/23 0839 05/31/23 0912  BP:  (!) 148/94  Pulse: 60 68  Resp: 11 18  Temp:  (!) 36.2 C  SpO2: 98% 99%    Last Pain:  Vitals:   05/31/23 0912  TempSrc:   PainSc: 4                  Jaleea Alesi P Merrit Friesen

## 2023-05-31 NOTE — Discharge Instructions (Addendum)
  Post Anesthesia Home Care Instructions  Activity: Get plenty of rest for the remainder of the day. A responsible individual must stay with you for 24 hours following the procedure.  For the next 24 hours, DO NOT: -Drive a car -Advertising copywriter -Drink alcoholic beverages -Take any medication unless instructed by your physician -Make any legal decisions or sign important papers.  Meals: Start with liquid foods such as gelatin or soup. Progress to regular foods as tolerated. Avoid greasy, spicy, heavy foods. If nausea and/or vomiting occur, drink only clear liquids until the nausea and/or vomiting subsides. Call your physician if vomiting continues.  Special Instructions/Symptoms: Your throat may feel dry or sore from the anesthesia or the breathing tube placed in your throat during surgery. If this causes discomfort, gargle with warm salt water. The discomfort should disappear within 24 hours.  If you had a scopolamine patch placed behind your ear for the management of post- operative nausea and/or vomiting:  1. The medication in the patch is effective for 72 hours, after which it should be removed.  Wrap patch in a tissue and discard in the trash. Wash hands thoroughly with soap and water. 2. You may remove the patch earlier than 72 hours if you experience unpleasant side effects which may include dry mouth, dizziness or visual disturbances. 3. Avoid touching the patch. Wash your hands with soap and water after contact with the patch.     Information for Discharge Teaching: EXPAREL (bupivacaine liposome injectable suspension)   Your surgeon or anesthesiologist gave you EXPAREL(bupivacaine) to help control your pain after surgery.  EXPAREL is a local anesthetic that provides pain relief by numbing the tissue around the surgical site. EXPAREL is designed to release pain medication over time and can control pain for up to 72 hours. Depending on how you respond to EXPAREL, you may require  less pain medication during your recovery.  Possible side effects: Temporary loss of sensation or ability to move in the area where bupivacaine was injected. Nausea, vomiting, constipation Rarely, numbness and tingling in your mouth or lips, lightheadedness, or anxiety may occur. Call your doctor right away if you think you may be experiencing any of these sensations, or if you have other questions regarding possible side effects.  Follow all other discharge instructions given to you by your surgeon or nurse. Eat a healthy diet and drink plenty of water or other fluids.  If you return to the hospital for any reason within 96 hours following the administration of EXPAREL, it is important for health care providers to know that you have received this anesthetic. A teal colored band has been placed on your arm with the date, time and amount of EXPAREL you have received in order to alert and inform your health care providers. Please leave this armband in place for the full 96 hours following administration, and then you may remove the band.  May have Tylenol today after 12:40 PM May have Oxycodone today after 3PM

## 2023-06-03 ENCOUNTER — Encounter (HOSPITAL_BASED_OUTPATIENT_CLINIC_OR_DEPARTMENT_OTHER): Payer: Self-pay | Admitting: General Surgery

## 2023-06-03 LAB — SURGICAL PATHOLOGY

## 2023-06-04 ENCOUNTER — Other Ambulatory Visit: Payer: Self-pay | Admitting: Internal Medicine

## 2023-07-04 LAB — HM DIABETES EYE EXAM

## 2023-08-01 ENCOUNTER — Encounter (INDEPENDENT_AMBULATORY_CARE_PROVIDER_SITE_OTHER): Payer: Self-pay

## 2023-08-07 ENCOUNTER — Other Ambulatory Visit: Payer: Self-pay | Admitting: Internal Medicine

## 2023-08-07 ENCOUNTER — Encounter: Payer: Self-pay | Admitting: Internal Medicine

## 2023-08-15 ENCOUNTER — Encounter: Payer: Self-pay | Admitting: Internal Medicine

## 2023-08-16 DIAGNOSIS — G8929 Other chronic pain: Secondary | ICD-10-CM | POA: Diagnosis not present

## 2023-08-16 DIAGNOSIS — Z79899 Other long term (current) drug therapy: Secondary | ICD-10-CM | POA: Diagnosis not present

## 2023-08-16 DIAGNOSIS — M25561 Pain in right knee: Secondary | ICD-10-CM | POA: Diagnosis not present

## 2023-08-16 DIAGNOSIS — M5136 Other intervertebral disc degeneration, lumbar region: Secondary | ICD-10-CM | POA: Diagnosis not present

## 2023-08-16 DIAGNOSIS — M47816 Spondylosis without myelopathy or radiculopathy, lumbar region: Secondary | ICD-10-CM | POA: Diagnosis not present

## 2023-08-16 DIAGNOSIS — M545 Low back pain, unspecified: Secondary | ICD-10-CM | POA: Diagnosis not present

## 2023-08-16 DIAGNOSIS — M25562 Pain in left knee: Secondary | ICD-10-CM | POA: Diagnosis not present

## 2023-08-20 ENCOUNTER — Encounter: Payer: Self-pay | Admitting: Internal Medicine

## 2023-08-21 ENCOUNTER — Other Ambulatory Visit (HOSPITAL_COMMUNITY): Payer: Self-pay

## 2023-08-23 ENCOUNTER — Telehealth: Payer: Self-pay

## 2023-08-23 NOTE — Telephone Encounter (Signed)
Needs PA please.

## 2023-08-27 ENCOUNTER — Telehealth: Payer: Self-pay

## 2023-08-27 ENCOUNTER — Other Ambulatory Visit (HOSPITAL_COMMUNITY): Payer: Self-pay

## 2023-08-27 NOTE — Telephone Encounter (Signed)
PA has been submitted in separate encounter

## 2023-08-27 NOTE — Telephone Encounter (Signed)
PA request has been Submitted. New Encounter created for follow up. For additional info see Pharmacy Prior Auth telephone encounter from 08/27/2023.

## 2023-08-27 NOTE — Telephone Encounter (Signed)
Pharmacy Patient Advocate Encounter   Received notification from Pt Calls Messages that prior authorization for Zolpidem Tartrate ER 12.5MG  er tablets is required/requested.   Insurance verification completed.   The patient is insured through [facility] .   Per test claim: PA required; PA submitted to [facility] via CoverMyMeds Key/confirmation #/EOC [id number] Status is pending

## 2023-08-28 MED ORDER — SUVOREXANT 10 MG PO TABS: 10.0000 mg | ORAL_TABLET | Freq: Every day | ORAL | 0 refills | Status: DC

## 2023-08-28 NOTE — Telephone Encounter (Signed)
Spoke w/ Pt- informed of med change, he is willing to give Belsomra a try- he knows not to mix w/ Ambien.

## 2023-08-28 NOTE — Telephone Encounter (Signed)
Call the patient.  Let him know of his insurance decision. He could continue Ambien if he is willing to pay out-of-pocket.  Otherwise I sent a prescription for suvorexant also known as Belsomra.  To take it 30 minutes before bedtime and see how that works.  Do not mix this medication with any leftover Ambien.

## 2023-08-28 NOTE — Telephone Encounter (Signed)
Pharmacy Patient Advocate Encounter  Received notification from [facility] that Prior Authorization for Zolpidem ER Tab 12.5mg  has been DENIED.  Full denial letter will be uploaded to the media tab. See denial reason below.    PA #/Case ID/Reference #: [id number]

## 2023-08-28 NOTE — Addendum Note (Signed)
Addended by: Willow Ora E on: 08/28/2023 02:55 PM   Modules accepted: Orders

## 2023-08-28 NOTE — Telephone Encounter (Signed)
PA denied. Plan requires him to try and fail Belsomra or Quviviq. Please advise.

## 2023-09-01 ENCOUNTER — Other Ambulatory Visit: Payer: Self-pay | Admitting: Internal Medicine

## 2023-09-03 ENCOUNTER — Ambulatory Visit (INDEPENDENT_AMBULATORY_CARE_PROVIDER_SITE_OTHER): Payer: Medicare Other | Admitting: Internal Medicine

## 2023-09-03 ENCOUNTER — Encounter: Payer: Self-pay | Admitting: Internal Medicine

## 2023-09-03 VITALS — BP 122/68 | HR 54 | Temp 97.8°F | Resp 16 | Ht 70.0 in | Wt 214.1 lb

## 2023-09-03 DIAGNOSIS — Z7984 Long term (current) use of oral hypoglycemic drugs: Secondary | ICD-10-CM

## 2023-09-03 DIAGNOSIS — E118 Type 2 diabetes mellitus with unspecified complications: Secondary | ICD-10-CM | POA: Diagnosis not present

## 2023-09-03 DIAGNOSIS — G4700 Insomnia, unspecified: Secondary | ICD-10-CM

## 2023-09-03 DIAGNOSIS — E785 Hyperlipidemia, unspecified: Secondary | ICD-10-CM | POA: Diagnosis not present

## 2023-09-03 LAB — HEMOGLOBIN A1C: Hgb A1c MFr Bld: 5.7 % (ref 4.6–6.5)

## 2023-09-03 LAB — LIPID PANEL
Cholesterol: 140 mg/dL (ref 0–200)
HDL: 43.4 mg/dL (ref >39.00)
LDL Cholesterol: 48 mg/dL (ref 0–99)
NonHDL: 96.66
Total CHOL/HDL Ratio: 3
Triglycerides: 243 mg/dL — ABNORMAL HIGH (ref 0.0–149.0)
VLDL: 48.6 mg/dL — ABNORMAL HIGH (ref 0.0–40.0)

## 2023-09-03 MED ORDER — DARIDOREXANT HCL 25 MG PO TABS: 25.0000 mg | ORAL_TABLET | Freq: Every evening | ORAL | 1 refills | Status: DC | PRN

## 2023-09-03 NOTE — Progress Notes (Unsigned)
Subjective:    Patient ID: Daniel [name], male    DOB: [birth year], 57 y.o.   MRN: [id number]  DOS:  09/03/2023 Type of visit - description: Follow-up  Here for management of chronic medical problems. MSK: Still having significant pain. Is having a headache, he thinks his neck related because it gets worse with certain movements of the neck.  Had a major injury years ago but nothing recent. Not the worst headache of his life.   Review of Systems See above   Past Medical History:  Diagnosis Date   Asthma    Depression    h/o   Diabetes mellitus    GERD (gastroesophageal reflux disease)    H/O Clostridium difficile infection 08/2015   Hyperlipidemia    Hypertension    Insomnia    Polyarthralgia 2009   blood work (-) CKs slightly elevated, bone san (-) saw rheumatology; continue w/ somptoms after holding zocor    Past Surgical History:  Procedure Laterality Date   CERVICAL SPINE SURGERY  05/31/2019   operated on C4-C5   EVALUATION UNDER ANESTHESIA WITH HEMORRHOIDECTOMY N/A 05/31/2023   Procedure: EXAM UNDER ANESTHESIA WITH HEMORRHOIDECTOMY;  Surgeon: Violeta Gelinas, MD;  Location: [facility];  Service: General;  Laterality: N/A;  HARMONIC SCALPEL   EXCISION OF SKIN TAG N/A 05/31/2023   Procedure: EXCISION OF ANAL SKIN TAG;  Surgeon: Violeta Gelinas, MD;  Location: [facility];  Service: General;  Laterality: N/A;   HAND SURGERY Left 2006   HEMORRHOID SURGERY     HEMORRHOID SURGERY N/A 05/31/2023   Procedure: INTERNAL AND  EXTERNAL HEMORRHOIDECTOMY;  Surgeon: Violeta Gelinas, MD;  Location: [facility];  Service: General;  Laterality: N/A;   HERNIA REPAIR  05/2018   umbilical   KNEE SURGERY Right 2011    NASAL FRACTURE SURGERY  2006   TOTAL KNEE ARTHROPLASTY Left 03/07/2020   Procedure: TOTAL KNEE ARTHROPLASTY;  Surgeon: Ollen Gross, MD;  Location: [facility];  Service: Orthopedics;  Laterality: Left;    TOTAL KNEE REVISION  Left 11/15/2021   Procedure: Left knee tibial versus total knee arthroplasty revision;  Surgeon: Ollen Gross, MD;  Location: [facility];  Service: Orthopedics;  Laterality: Left;    Current Outpatient Medications  Medication Instructions   albuterol (VENTOLIN HFA) 108 (90 Base) MCG/ACT inhaler 1-2 puffs, Inhalation, Every 6 hours PRN   aspirin EC 81 mg, Oral, Daily, Swallow whole.   atorvastatin (LIPITOR) 20 mg, Oral, Daily at bedtime   carvedilol (COREG) 12.5 mg, Oral, 2 times daily with meals   fish oil-omega-3 fatty acids 1,000 mg, Oral, Daily,     gabapentin (NEURONTIN) 300 MG capsule Take a 300 mg capsule three times a day for two weeks following surgery.Then take a 300 mg capsule two times a day for two weeks. Then take a 300 mg capsule once a day for two weeks. Then discontinue.   glucose blood (ONETOUCH ULTRA) test strip USE TO CHECK BLOOD SUGAR TWICE DAILY   HYDROcodone-acetaminophen (NORCO) 10-325 MG tablet 1 tablet, Oral, Every 6 hours PRN   hydrocortisone (ANUSOL-HC) 25 mg, Rectal, Daily PRN   hydrocortisone 2.5 % cream Topical, 2 times daily   Lancets (ONETOUCH DELICA PLUS LANCET33G) MISC CHECK BLOOD SUGARS TWICE DAILY   losartan (COZAAR) 50 mg, Oral, Daily   metFORMIN (GLUCOPHAGE) 500 mg, Oral, 2 times daily with meals   methocarbamol (ROBAXIN) 500 mg, Oral, Every 6 hours PRN   Multiple Vitamin (MULTIVITAMIN WITH MINERALS) TABS  tablet 1 tablet, Oral, Daily   oxyCODONE (OXY IR/ROXICODONE) 5-10 mg, Oral, Every 6 hours PRN   pantoprazole (PROTONIX) 40 mg, Oral, 2 times daily before meals   Probiotic Product (PROBIOTIC DAILY PO) 1 tablet, Oral, Daily   Red Yeast Rice 600 mg, Oral, Daily   Suvorexant 10 mg, Oral, Daily at bedtime   tadalafil (CIALIS) 5 mg, Oral, Daily   traMADol (ULTRAM) 50-100 mg, Oral, Every 6 hours PRN   triamcinolone cream (KENALOG) 0.1 % 1 Application, Topical, 2 times daily   valACYclovir (VALTREX) 500 mg, Oral, Daily   zolpidem (AMBIEN CR) 12.5 MG CR  tablet Take 1 tablet (12.5 mg total) by mouth at bedtime as needed for sleep.       Objective:   Physical Exam BP 122/68   Pulse (!) 54   Temp 97.8 F (36.6 C) (Oral)   Resp 16   Ht 5\' 10"  (1.778 m)   Wt 214 lb 2 oz (97.1 kg)   SpO2 96%   BMI 30.72 kg/m  General:   Well developed, NAD, BMI noted. HEENT:  Normocephalic . Face symmetric, atraumatic Lungs:  CTA B Normal respiratory effort, no intercostal retractions, no accessory muscle use. Heart: RRR,  no murmur.  Lower extremities: no pretibial edema bilaterally  Skin: Not pale. Not jaundice Neurologic:  alert & oriented X3.  Speech normal, gait appropriate for age and unassisted Psych--  Cognition and judgment appear intact.  Cooperative with normal attention span and concentration.  Behavior appropriate. No anxious or depressed appearing.      Assessment     Assessment  DM  HTN Hyperlipidemia Psych: --Anxiety, insomnia, History of Prozac intolerance "it may me like to hurt people". --PTSD (from major injury 01/2019) Asthma GERD Insomnia Hemorrhoids: History of surgery Polyarthralgia: w/u and rheumatology eval neg 2009, stopping zocor did not help Diarrhea, + c diff 08-2015, s/p abx  Major injury at work 01/2019, pain management per Ortho   PLAN DM: On metformin, check A1c. Hyperlipidemia: On atorvastatin, last TG slightly elevated, he is fasting today, check FLP. Insomnia: Due to insurance demands, we had to stop Ambien and try Suvorexan, it did not work, we are trying daridorexant 25 mg to 50 mg at bedtime.  If that is not effective, we will go back on Ambien MSK: Managed elsewhere, to have local injection at the spine in 3 different places per patient.  Vaccine advice: Already scheduled for his flu and COVID-vaccine, recommended Tdap at the pharmacy  RTC 4 months

## 2023-09-03 NOTE — Patient Instructions (Addendum)
Vaccines I recommend: Proceed with a tetanus  shot at the pharmacy.  Be sure you get the Tdap (with the whooping cough component)   Check the  blood pressure regularly Blood pressure goal:  between 110/65 and  135/85. If it is consistently higher or lower, let me know     GO TO THE LAB : Get the blood work     Next visit with me in 4 months.    Please schedule it at the front desk

## 2023-09-04 NOTE — Assessment & Plan Note (Signed)
DM: On metformin, check A1c.  Further advised with results. Hyperlipidemia: On atorvastatin, last TG slightly elevated, he is fasting today, check FLP. Insomnia: Due to insurance demands, we had to stop Ambien and try Suvorexan, it did not work, will try daridorexant 25 mg to 50 mg at bedtime.  If that is not effective, we will go back on Ambien MSK: Managed elsewhere, to have local injection at the spine in 3 different places per patient.  Vaccine advice: Already scheduled for his flu and COVID-vaccine, recommended Tdap at the pharmacy RTC 4 months

## 2023-09-09 DIAGNOSIS — M47815 Spondylosis without myelopathy or radiculopathy, thoracolumbar region: Secondary | ICD-10-CM | POA: Diagnosis not present

## 2023-09-09 DIAGNOSIS — M47816 Spondylosis without myelopathy or radiculopathy, lumbar region: Secondary | ICD-10-CM | POA: Diagnosis not present

## 2023-09-09 DIAGNOSIS — M5136 Other intervertebral disc degeneration, lumbar region: Secondary | ICD-10-CM | POA: Diagnosis not present

## 2023-09-09 DIAGNOSIS — G8929 Other chronic pain: Secondary | ICD-10-CM | POA: Diagnosis not present

## 2023-09-10 ENCOUNTER — Encounter: Payer: Self-pay | Admitting: Internal Medicine

## 2023-09-10 ENCOUNTER — Other Ambulatory Visit: Payer: Self-pay | Admitting: Family

## 2023-09-10 DIAGNOSIS — G4700 Insomnia, unspecified: Secondary | ICD-10-CM

## 2023-09-10 MED ORDER — ZOLPIDEM TARTRATE ER 12.5 MG PO TBCR
12.5000 mg | EXTENDED_RELEASE_TABLET | Freq: Every evening | ORAL | 0 refills | Status: DC | PRN
Start: 2023-09-10 — End: 2023-11-04

## 2023-09-16 DIAGNOSIS — G8929 Other chronic pain: Secondary | ICD-10-CM | POA: Diagnosis not present

## 2023-09-16 DIAGNOSIS — M47815 Spondylosis without myelopathy or radiculopathy, thoracolumbar region: Secondary | ICD-10-CM | POA: Diagnosis not present

## 2023-09-16 DIAGNOSIS — M5136 Other intervertebral disc degeneration, lumbar region: Secondary | ICD-10-CM | POA: Diagnosis not present

## 2023-09-16 DIAGNOSIS — M47816 Spondylosis without myelopathy or radiculopathy, lumbar region: Secondary | ICD-10-CM | POA: Diagnosis not present

## 2023-10-22 DIAGNOSIS — G8929 Other chronic pain: Secondary | ICD-10-CM | POA: Diagnosis not present

## 2023-10-22 DIAGNOSIS — M47816 Spondylosis without myelopathy or radiculopathy, lumbar region: Secondary | ICD-10-CM | POA: Diagnosis not present

## 2023-10-22 DIAGNOSIS — M47819 Spondylosis without myelopathy or radiculopathy, site unspecified: Secondary | ICD-10-CM | POA: Diagnosis not present

## 2023-10-22 DIAGNOSIS — M7918 Myalgia, other site: Secondary | ICD-10-CM | POA: Diagnosis not present

## 2023-10-22 DIAGNOSIS — Z79899 Other long term (current) drug therapy: Secondary | ICD-10-CM | POA: Diagnosis not present

## 2023-10-22 DIAGNOSIS — M792 Neuralgia and neuritis, unspecified: Secondary | ICD-10-CM | POA: Diagnosis not present

## 2023-11-04 ENCOUNTER — Telehealth: Payer: Self-pay

## 2023-11-04 DIAGNOSIS — G4700 Insomnia, unspecified: Secondary | ICD-10-CM

## 2023-11-04 NOTE — Telephone Encounter (Signed)
Requesting: Ambien CR 12.5mg   Contract: Under contract w/ pain medicine UDS: Under contract w/ pain medicine Last Visit: 09/03/23 Next Visit: 01/03/24 Last Refill: 09/10/23 #30 and 0RF   Please Advise

## 2023-11-05 MED ORDER — ZOLPIDEM TARTRATE ER 12.5 MG PO TBCR: 12.5000 mg | EXTENDED_RELEASE_TABLET | Freq: Every evening | ORAL | 2 refills | Status: DC | PRN

## 2023-11-05 NOTE — Telephone Encounter (Signed)
PDMP okay Rx sent ?

## 2023-11-06 ENCOUNTER — Telehealth: Payer: Self-pay

## 2023-11-06 NOTE — Telephone Encounter (Signed)
PA initiated via Covermymeds; KEY: [id number]. Awaiting determination.

## 2023-11-06 NOTE — Telephone Encounter (Signed)
Further questions received via fax. Completed and faxed back to [facility] at [phone].

## 2023-11-07 ENCOUNTER — Ambulatory Visit: Payer: Medicare Other

## 2023-11-07 VITALS — Ht 70.0 in | Wt 214.0 lb

## 2023-11-07 DIAGNOSIS — Z0000 Encounter for general adult medical examination without abnormal findings: Secondary | ICD-10-CM

## 2023-11-07 NOTE — Patient Instructions (Addendum)
Daniel [name] , Thank you for taking time to come for your Medicare Wellness Visit. I appreciate your ongoing commitment to your health goals. Please review the following plan we discussed and let me know if I can assist you in the future.   Referrals/Orders/Follow-Ups/Clinician Recommendations:   This is a list of the screening recommended for you and due dates:  Health Maintenance  Topic Date Due   Complete foot exam   01/30/2023   COVID-19 Vaccine (7 - 2023-24 season) 08/18/2023   DTaP/Tdap/Td vaccine (3 - Td or Tdap) 12/08/2023   Yearly kidney health urinalysis for diabetes  02/29/2024   Hemoglobin A1C  03/02/2024   Yearly kidney function blood test for diabetes  05/21/2024   Eye exam for diabetics  07/03/2024   Colon Cancer Screening  10/06/2024   Medicare Annual Wellness Visit  11/06/2024   Flu Shot  Completed   Hepatitis C Screening  Completed   HIV Screening  Completed   Zoster (Shingles) Vaccine  Completed   HPV Vaccine  Aged Out  Opioid Pain Medicine Management Opioids are powerful medicines that are used to treat moderate to severe pain. When used for short periods of time, they can help you to: Sleep better. Do better in physical or occupational therapy. Feel better in the first few days after an injury. Recover from surgery. Opioids should be taken with the supervision of a trained health care provider. They should be taken for the shortest period of time possible. This is because opioids can be addictive, and the longer you take opioids, the greater your risk of addiction. This addiction can also be called opioid use disorder. What are the risks? Using opioid pain medicines for longer than 3 days increases your risk of side effects. Side effects include: Constipation. Nausea and vomiting. Breathing difficulties (respiratory depression). Drowsiness. Confusion. Opioid use disorder. Itching. Taking opioid pain medicine for a long period of time can affect your ability  to do daily tasks. It also puts you at risk for: Motor vehicle crashes. Depression. Suicide. Heart attack. Overdose, which can be life-threatening. What is a pain treatment plan? A pain treatment plan is an agreement between you and your health care provider. Pain is unique to each person, and treatments vary depending on your condition. To manage your pain, you and your health care provider need to work together. To help you do this: Discuss the goals of your treatment, including how much pain you might expect to have and how you will manage the pain. Review the risks and benefits of taking opioid medicines. Remember that a good treatment plan uses more than one approach and minimizes the chance of side effects. Be honest about the amount of medicines you take and about any drug or alcohol use. Get pain medicine prescriptions from only one health care provider. Pain can be managed with many types of alternative treatments. Ask your health care provider to refer you to one or more specialists who can help you manage pain through: Physical or occupational therapy. Counseling (cognitive behavioral therapy). Good nutrition. Biofeedback. Massage. Meditation. Non-opioid medicine. Following a gentle exercise program. How to use opioid pain medicine Taking medicine Take your pain medicine exactly as told by your health care provider. Take it only when you need it. If your pain gets less severe, you may take less than your prescribed dose if your health care provider approves. If you are not having pain, do nottake pain medicine unless your health care provider tells you to take  it. If your pain is severe, do nottry to treat it yourself by taking more pills than instructed on your prescription. Contact your health care provider for help. Write down the times when you take your pain medicine. It is easy to become confused while on pain medicine. Writing the time can help you avoid overdose. Take  other over-the-counter or prescription medicines only as told by your health care provider. Keeping yourself and others safe  While you are taking opioid pain medicine: Do not drive, use machinery, or power tools. Do not sign legal documents. Do not drink alcohol. Do not take sleeping pills. Do not supervise children by yourself. Do not do activities that require climbing or being in high places. Do not go to a lake, river, ocean, spa, or swimming pool. Do not share your pain medicine with anyone. Keep pain medicine in a locked cabinet or in a secure area where pets and children cannot reach it. Stopping your use of opioids If you have been taking opioid medicine for more than a few weeks, you may need to slowly decrease (taper) how much you take until you stop completely. Tapering your use of opioids can decrease your risk of symptoms of withdrawal, such as: Pain and cramping in the abdomen. Nausea. Sweating. Sleepiness. Restlessness. Uncontrollable shaking (tremors). Cravings for the medicine. Do not attempt to taper your use of opioids on your own. Talk with your health care provider about how to do this. Your health care provider may prescribe a step-down schedule based on how much medicine you are taking and how long you have been taking it. Getting rid of leftover pills Do not save any leftover pills. Get rid of leftover pills safely by: Taking the medicine to a prescription take-back program. This is usually offered by the county or law enforcement. Bringing them to a pharmacy that has a drug disposal container. Flushing them down the toilet. Check the label or package insert of your medicine to see whether this is safe to do. Throwing them out in the trash. Check the label or package insert of your medicine to see whether this is safe to do. If it is safe to throw it out, remove the medicine from the original container, put it into a sealable bag or container, and mix it with  used coffee grounds, food scraps, dirt, or cat litter before putting it in the trash. Follow these instructions at home: Activity Do exercises as told by your health care provider. Avoid activities that make your pain worse. Return to your normal activities as told by your health care provider. Ask your health care provider what activities are safe for you. General instructions You may need to take these actions to prevent or treat constipation: Drink enough fluid to keep your urine pale yellow. Take over-the-counter or prescription medicines. Eat foods that are high in fiber, such as beans, whole grains, and fresh fruits and vegetables. Limit foods that are high in fat and processed sugars, such as fried or sweet foods. Keep all follow-up visits. This is important. Where to find support If you have been taking opioids for a long time, you may benefit from receiving support for quitting from a local support group or counselor. Ask your health care provider for a referral to these resources in your area. Where to find more information Centers for Disease Control and Prevention (CDC): FootballExhibition.com.br [place] Food and Drug Administration (FDA): PumpkinSearch.com.ee Get help right away if: You may have taken too much of an  opioid (overdosed). Common symptoms of an overdose: Your breathing is slower or more shallow than normal. You have a very slow heartbeat (pulse). You have slurred speech. You have nausea and vomiting. Your pupils become very small. You have other potential symptoms: You are very confused. You faint or feel like you will faint. You have cold, clammy skin. You have blue lips or fingernails. You have thoughts of harming yourself or harming others. These symptoms may represent a serious problem that is an emergency. Do not wait to see if the symptoms will go away. Get medical help right away. Call your local emergency services (911 in the [place] Do not drive yourself to the hospital.  If  you ever feel like you may hurt yourself or others, or have thoughts about taking your own life, get help right away. Go to your nearest emergency department or: Call your local emergency services (911 in the [place] Call the [facility] (726-830-8809 in the [place] Call a suicide crisis helpline, such as the National Suicide Prevention Lifeline at 6783074378 or 988 in the [place] This is open 24 hours a day in the [place] Text the Crisis Text Line at 774-881-6018 (in the [place] Summary Opioid medicines can help you manage moderate to severe pain for a short period of time. A pain treatment plan is an agreement between you and your health care provider. Discuss the goals of your treatment, including how much pain you might expect to have and how you will manage the pain. If you think that you or someone else may have taken too much of an opioid, get medical help right away. This information is not intended to replace advice given to you by your health care provider. Make sure you discuss any questions you have with your health care provider. Document Revised: 06/28/2021 Document Reviewed: 03/15/2021 Elsevier Patient Education  2024 [employer]   Advanced directives: (Copy Requested) Please bring a copy of your health care power of attorney and living will to the office to be added to your chart at your convenience.  Next Medicare Annual Wellness Visit scheduled for next year: Yes   Insert preventive care Attachment reference

## 2023-11-07 NOTE — Telephone Encounter (Signed)
PA approved.   Request Reference Number: [id number]. ZOLPIDEM ER TAB 12.5MG  is approved through 12/16/2024. Your patient may now fill this prescription and it will be covered. Authorization Expiration Date: 12/16/2024

## 2023-11-07 NOTE — Progress Notes (Addendum)
Subjective:   Daniel [name] is a 57 y.o. male who presents for Medicare Annual/Subsequent preventive examination.  Visit Complete: Virtual I connected with  Daniel [name] on 11/07/23 by a audio enabled telemedicine application and verified that I am speaking with the correct person using two identifiers.  Patient Location: Home  Provider Location: Home Office  I discussed the limitations of evaluation and management by telemedicine. The patient expressed understanding and agreed to proceed.  Vital Signs: Because this visit was a virtual/telehealth visit, some criteria may be missing or patient reported. Any vitals not documented were not able to be obtained and vitals that have been documented are patient reported.  Patient Medicare AWV questionnaire was completed by the patient on 11/06/23; I have confirmed that all information answered by patient is correct and no changes since this date.  Cardiac Risk Factors include: advanced age (>71men, >77 women);diabetes mellitus;male gender;hypertension     Objective:    Today's Vitals   11/07/23 1354  Weight: 214 lb (97.1 kg)  Height: 5\' 10"  (1.778 m)   Body mass index is 30.71 kg/m.     11/07/2023    2:01 PM 05/31/2023    6:27 AM 11/15/2021    6:31 PM 11/02/2021    1:07 PM 03/07/2020    1:07 PM 03/07/2020    8:23 AM 02/26/2020    1:46 PM  Advanced Directives  Does Patient Have a Medical Advance Directive? Yes No No No No No No  Type of Estate agent of [place];Living will        Copy of Healthcare Power of Attorney in Chart? No - copy requested        Would patient like information on creating a medical advance directive?  No - Patient declined No - Patient declined No - Patient declined No - Patient declined No - Patient declined No - Patient declined    Current Medications (verified) Outpatient Encounter Medications as of 11/07/2023  Medication Sig   albuterol (VENTOLIN HFA) 108 (90 Base) MCG/ACT  inhaler Inhale 1-2 puffs into the lungs every 6 (six) hours as needed for wheezing or shortness of breath.   aspirin EC 81 MG tablet Take 81 mg by mouth daily. Swallow whole.   atorvastatin (LIPITOR) 20 MG tablet Take 1 tablet (20 mg total) by mouth at bedtime.   carvedilol (COREG) 12.5 MG tablet Take 1 tablet (12.5 mg total) by mouth 2 (two) times daily with a meal.   fish oil-omega-3 fatty acids 1000 MG capsule Take 1,000 mg by mouth daily.   gabapentin (NEURONTIN) 300 MG capsule Take a 300 mg capsule three times a day for two weeks following surgery.Then take a 300 mg capsule two times a day for two weeks. Then take a 300 mg capsule once a day for two weeks. Then discontinue.   glucose blood (ONETOUCH ULTRA) test strip USE TO CHECK BLOOD SUGAR TWICE DAILY   HYDROcodone-acetaminophen (NORCO) 10-325 MG tablet Take 1 tablet by mouth every 6 (six) hours as needed for severe pain.   hydrocortisone (ANUSOL-HC) 25 MG suppository Place 1 suppository (25 mg total) rectally daily as needed for hemorrhoids. (Patient not taking: Reported on 09/03/2023)   hydrocortisone 2.5 % cream Apply topically in the morning and at bedtime.   Lancets (ONETOUCH DELICA PLUS LANCET33G) MISC CHECK BLOOD SUGARS TWICE DAILY   losartan (COZAAR) 50 MG tablet Take 1 tablet (50 mg total) by mouth daily.   metFORMIN (GLUCOPHAGE) 500 MG tablet Take 1 tablet (  500 mg total) by mouth 2 (two) times daily with a meal.   methocarbamol (ROBAXIN) 500 MG tablet Take 1 tablet (500 mg total) by mouth every 6 (six) hours as needed for muscle spasms.   Multiple Vitamin (MULTIVITAMIN WITH MINERALS) TABS tablet Take 1 tablet by mouth daily.   oxyCODONE (OXY IR/ROXICODONE) 5 MG immediate release tablet Take 1-2 tablets (5-10 mg total) by mouth every 6 (six) hours as needed for severe pain.   pantoprazole (PROTONIX) 40 MG tablet Take 1 tablet (40 mg total) by mouth 2 (two) times daily before a meal.   Probiotic Product (PROBIOTIC DAILY PO) Take 1  tablet by mouth daily.    Red Yeast Rice 600 MG CAPS Take 600 mg by mouth daily.   tadalafil (CIALIS) 5 MG tablet Take 5 mg by mouth daily.   traMADol (ULTRAM) 50 MG tablet Take 1-2 tablets (50-100 mg total) by mouth every 6 (six) hours as needed for moderate pain. (Patient not taking: Reported on 09/03/2023)   triamcinolone cream (KENALOG) 0.1 % Apply 1 Application topically 2 (two) times daily.   valACYclovir (VALTREX) 500 MG tablet Take 1 tablet (500 mg total) by mouth daily.   zolpidem (AMBIEN CR) 12.5 MG CR tablet Take 1 tablet (12.5 mg total) by mouth at bedtime as needed for sleep.   No facility-administered encounter medications on file as of 11/07/2023.    Allergies (verified) Levofloxacin   History: Past Medical History:  Diagnosis Date   Asthma    Depression    h/o   Diabetes mellitus    GERD (gastroesophageal reflux disease)    H/O Clostridium difficile infection 08/2015   Hyperlipidemia    Hypertension    Insomnia    Polyarthralgia 2009   blood work (-) CKs slightly elevated, bone san (-) saw rheumatology; continue w/ somptoms after holding zocor   Past Surgical History:  Procedure Laterality Date   CERVICAL SPINE SURGERY  05/31/2019   operated on C4-C5   EVALUATION UNDER ANESTHESIA WITH HEMORRHOIDECTOMY N/A 05/31/2023   Procedure: EXAM UNDER ANESTHESIA WITH HEMORRHOIDECTOMY;  Surgeon: Violeta Gelinas, MD;  Location: [facility];  Service: General;  Laterality: N/A;  HARMONIC SCALPEL   EXCISION OF SKIN TAG N/A 05/31/2023   Procedure: EXCISION OF ANAL SKIN TAG;  Surgeon: Violeta Gelinas, MD;  Location:  SURGERY CENTER;  Service: General;  Laterality: N/A;   HAND SURGERY Left 2006   HEMORRHOID SURGERY     HEMORRHOID SURGERY N/A 05/31/2023   Procedure: INTERNAL AND  EXTERNAL HEMORRHOIDECTOMY;  Surgeon: Violeta Gelinas, MD;  Location:  SURGERY CENTER;  Service: General;  Laterality: N/A;   HERNIA REPAIR  05/2018   umbilical   KNEE  SURGERY Right 2011    NASAL FRACTURE SURGERY  2006   TOTAL KNEE ARTHROPLASTY Left 03/07/2020   Procedure: TOTAL KNEE ARTHROPLASTY;  Surgeon: Ollen Gross, MD;  Location: [facility];  Service: Orthopedics;  Laterality: Left;    TOTAL KNEE REVISION Left 11/15/2021   Procedure: Left knee tibial versus total knee arthroplasty revision;  Surgeon: Ollen Gross, MD;  Location: [facility];  Service: Orthopedics;  Laterality: Left;   Family History  Problem Relation Age of Onset   Diabetes Mother        ??   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  Vaping Use   Vaping status: Never Used  Substance and Sexual Activity   Alcohol use: Yes    Alcohol/week: 6.0 standard drinks of alcohol    Types: 6 Cans of beer per week   Drug use: No   Sexual activity: Not on file  Other Topics Concern   Not on file  Social History Narrative   Married, 1 adopted child   Household:pt and wife             Social Determinants of Health   Financial Resource Strain: Low Risk  (11/06/2023)   Overall Financial Resource Strain (CARDIA)    Difficulty of Paying Living Expenses: Not hard at all  Food Insecurity: No Food Insecurity (11/06/2023)   Hunger Vital Sign    Worried About Running Out of Food in the Last Year: Never true    Ran Out of Food in the Last Year: Never true  Transportation Needs: No Transportation Needs (11/06/2023)    PRAPARE - Administrator, Civil Service (Medical): No    Lack of Transportation (Non-Medical): No  Physical Activity: Sufficiently Active (11/06/2023)   Exercise Vital Sign    Days of Exercise per Week: 5 days    Minutes of Exercise per Session: 60 min  Stress: No Stress Concern Present (11/06/2023)   [employer] of Occupational Health - Occupational Stress Questionnaire    Feeling of Stress : Only a little  Social Connections: Unknown (11/06/2023)   Social Connection and Isolation Panel [NHANES]    Frequency of Communication with Friends and Family: More than three times a week    Frequency of Social Gatherings with Friends and Family: More than three times a week    Attends Religious Services: Not on Marketing executive or Organizations: Yes    Attends Banker Meetings: Never    Marital Status: Married    Tobacco Counseling Counseling given: Not Answered Tobacco comments: 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   Clinical Intake:  Pre-visit preparation completed: Yes  Pain : No/denies pain     BMI - recorded: 30.71 Nutritional Status: BMI > 30  Obese Nutritional Risks: None Diabetes: Yes CBG done?: No Did pt. bring in CBG monitor from home?: No  How often do you need to have someone help you when you read instructions, pamphlets, or other written materials from your doctor or pharmacy?: 1 - Never  Interpreter Needed?: No  Information entered by :: Theresa Mulligan LPN   Activities of Daily Living    11/06/2023    1:24 AM 05/31/2023    6:34 AM  In your present state of health, do you have any difficulty performing the following activities:  Hearing? 0 0  Vision? 0 0  Difficulty concentrating or making decisions? 1 0  Comment Due to previous accident. Followed by medical attention   Walking or climbing stairs? 0 0  Dressing or bathing? 0 0  Doing errands, shopping? 0   Preparing Food and eating ? N    Using the Toilet? N   In the past six months, have you accidently leaked urine? N   Do you have problems with loss of bowel control? N   Managing your Medications? N   Managing your Finances? N   Housekeeping or managing your Housekeeping? N     Patient Care Team: Wanda Plump, MD as PCP - General Marzella Schlein., MD as Consulting Physician (Ophthalmology) Tressie Stalker, MD as Consulting Physician (Neurosurgery) Rene Paci, MD as Consulting Physician (Urology)  Indicate any recent Medical Services you may have received from other than [facility] providers in the past year (date may be approximate).     Assessment:   This is a routine wellness examination for [place].  Hearing/Vision screen Hearing Screening - Comments:: Denies hearing difficulties   Vision Screening - Comments:: Wears rx glasses - up to date with routine eye exams with  [facility]   Goals Addressed               This Visit's Progress     Stay Active (pt-stated)         Depression Screen    11/07/2023    2:12 PM 09/03/2023    1:32 PM 03/01/2023    3:36 PM 07/30/2022    2:18 PM 01/30/2022    2:04 PM 07/31/2021    1:22 PM 01/31/2021    1:27 PM  PHQ 2/9 Scores  PHQ - 2 Score 0 1 2 1 1 2 4   PHQ- 9 Score  3 5 4 4 7 13     Fall Risk    11/07/2023    2:00 PM 11/06/2023    1:24 AM 09/03/2023    1:05 PM 03/01/2023    3:01 PM 07/30/2022    2:24 PM  Fall Risk   Falls in the past year? 0 0 0 0 0  Number falls in past yr: 0  0 0 0  Injury with Fall? 0  0 0 0  Risk for fall due to : No Fall Risks      Follow up Falls prevention discussed  Falls evaluation completed Falls evaluation completed Falls evaluation completed    MEDICARE RISK AT HOME: Medicare Risk at Home Any stairs in or around the home?: Yes If so, are there any without handrails?: No Home free of loose throw rugs in walkways, pet beds, electrical cords, etc?: Yes Adequate lighting in your home to reduce risk of falls?: Yes Life  alert?: No Use of a cane, walker or w/c?: No Grab bars in the bathroom?: No Shower chair or bench in shower?: No Elevated toilet seat or a handicapped toilet?: No  TIMED UP AND GO:  Was the test performed?  No    Cognitive Function:        11/07/2023    2:01 PM  6CIT Screen  What Year? 0 points  What month? 0 points  What time? 0 points  Count back from 20 0 points  Months in reverse 0 points  Repeat phrase 0 points  Total Score 0 points    Immunizations Immunization History  Administered Date(s) Administered   Influenza Split 08/17/2018, 09/16/2021, 09/16/2022   Influenza Whole 10/17/2010   Influenza,inj,Quad PF,6+ Mos 11/17/2019, 09/08/2020   Influenza-Unspecified 10/02/2015, 08/17/2016, 09/20/2017, 09/20/2023   Moderna Covid-19 Vaccine Bivalent Booster 82yrs & up 08/23/2021, 09/16/2022   Moderna Sars-Covid-2 Vaccination 01/18/2020, 02/15/2020, 10/13/2020, 04/19/2021   Pneumococcal  Conjugate-13 06/07/2016   Pneumococcal Polysaccharide-23 07/30/2014   Td 12/17/2004   Tdap 12/07/2013   Zoster Recombinant(Shingrix) 01/09/2019, 07/17/2019    TDAP status: Up to date  Flu Vaccine status: Up to date    Covid-19 vaccine status: Declined, Education has been provided regarding the importance of this vaccine but patient still declined. Advised may receive this vaccine at local pharmacy or Health Dept.or vaccine clinic. Aware to provide a copy of the vaccination record if obtained from local pharmacy or Health Dept. Verbalized acceptance and understanding.  Qualifies for Shingles Vaccine? Yes   Zostavax completed Yes   Shingrix Completed?: Yes  Screening Tests Health Maintenance  Topic Date Due   FOOT EXAM  01/30/2023   COVID-19 Vaccine (7 - 2023-24 season) 08/18/2023   DTaP/Tdap/Td (3 - Td or Tdap) 12/08/2023   Diabetic kidney evaluation - Urine ACR  02/29/2024   HEMOGLOBIN A1C  03/02/2024   Diabetic kidney evaluation - eGFR measurement  05/21/2024    OPHTHALMOLOGY EXAM  07/03/2024   Colonoscopy  10/06/2024   Medicare Annual Wellness (AWV)  11/06/2024   INFLUENZA VACCINE  Completed   Hepatitis C Screening  Completed   HIV Screening  Completed   Zoster Vaccines- Shingrix  Completed   HPV VACCINES  Aged Out    Health Maintenance  Health Maintenance Due  Topic Date Due   FOOT EXAM  01/30/2023   COVID-19 Vaccine (7 - 2023-24 season) 08/18/2023    Colorectal cancer screening: Type of screening: Colonoscopy. Completed 10/06/14. Repeat every 10 years    Additional Screening:  Hepatitis C Screening: does qualify; Completed 09/12/15  Vision Screening: Recommended annual ophthalmology exams for early detection of glaucoma and other disorders of the eye. Is the patient up to date with their annual eye exam?  Yes  Who is the provider or what is the name of the office in which the patient attends annual eye exams? [facility] If pt is not established with a provider, would they like to be referred to a provider to establish care? No .   Dental Screening: Recommended annual dental exams for proper oral hygiene  Diabetic Foot Exam: Diabetic Foot Exam: Overdue, Pt has been advised about the importance in completing this exam. Pt is scheduled for diabetic foot exam on Followed by PCP.  Community Resource Referral / Chronic Care Management:  CRR required this visit?  No   CCM required this visit?  No     Plan:     I have personally reviewed and noted the following in the patient's chart:   Medical and social history Use of alcohol, tobacco or illicit drugs  Current medications and supplements including opioid prescriptions. Patient is currently taking opioid prescriptions. Information provided to patient regarding non-opioid alternatives. Patient advised to discuss non-opioid treatment plan with their provider. Functional ability and status Nutritional status Physical activity Advanced directives List of other  physicians Hospitalizations, surgeries, and ER visits in previous 12 months Vitals Screenings to include cognitive, depression, and falls Referrals and appointments  In addition, I have reviewed and discussed with patient certain preventive protocols, quality metrics, and best practice recommendations. A written personalized care plan for preventive services as well as general preventive health recommendations were provided to patient.     Tillie Rung, LPN   12/25/3233   After Visit Summary: (MyChart) Due to this being a telephonic visit, the after visit summary with patients personalized plan was offered to patient via MyChart   Nurse Notes: None

## 2023-11-18 DIAGNOSIS — L80 Vitiligo: Secondary | ICD-10-CM | POA: Diagnosis not present

## 2024-01-03 ENCOUNTER — Ambulatory Visit (INDEPENDENT_AMBULATORY_CARE_PROVIDER_SITE_OTHER): Payer: Medicare Other | Admitting: Internal Medicine

## 2024-01-03 ENCOUNTER — Encounter: Payer: Self-pay | Admitting: Internal Medicine

## 2024-01-03 VITALS — BP 120/82 | HR 70 | Temp 98.0°F | Resp 16 | Ht 70.0 in | Wt 209.2 lb

## 2024-01-03 DIAGNOSIS — Z7185 Encounter for immunization safety counseling: Secondary | ICD-10-CM | POA: Diagnosis not present

## 2024-01-03 DIAGNOSIS — E118 Type 2 diabetes mellitus with unspecified complications: Secondary | ICD-10-CM

## 2024-01-03 DIAGNOSIS — Z7984 Long term (current) use of oral hypoglycemic drugs: Secondary | ICD-10-CM

## 2024-01-03 DIAGNOSIS — M2550 Pain in unspecified joint: Secondary | ICD-10-CM

## 2024-01-03 DIAGNOSIS — E785 Hyperlipidemia, unspecified: Secondary | ICD-10-CM

## 2024-01-03 DIAGNOSIS — I10 Essential (primary) hypertension: Secondary | ICD-10-CM

## 2024-01-03 NOTE — Patient Instructions (Addendum)
Vaccines I recommend: Tdap (tetanus)  Continue checking your blood pressure regularly Blood pressure goal:  between 110/65 and  135/85. If it is consistently higher or lower, let me know     GO TO THE LAB : Get the blood work     Next visit with me 3 to 4 months for a physical exam    Please schedule it at the front desk

## 2024-01-03 NOTE — Progress Notes (Unsigned)
Subjective:    Patient ID: Daniel [name], male    DOB: 1966/05/07, 58 y.o.   MRN: [id number]  DOS:  01/03/2024 Type of visit - description: Follow-up  Medical problems reviewed. Overall doing well. MSK: Still hurts but he is able to remain active, walking 3 times a week up to 4 miles. We talked about his ambulatory blood pressures and blood sugars. Sleeping well on Ambien  Review of Systems See above   Past Medical History:  Diagnosis Date   Asthma    Depression    h/o   Diabetes mellitus    GERD (gastroesophageal reflux disease)    H/O Clostridium difficile infection 08/2015   Hyperlipidemia    Hypertension    Insomnia    Polyarthralgia 2009   blood work (-) CKs slightly elevated, bone san (-) saw rheumatology; continue w/ somptoms after holding zocor    Past Surgical History:  Procedure Laterality Date   CERVICAL SPINE SURGERY  05/31/2019   operated on C4-C5   EVALUATION UNDER ANESTHESIA WITH HEMORRHOIDECTOMY N/A 05/31/2023   Procedure: EXAM UNDER ANESTHESIA WITH HEMORRHOIDECTOMY;  Surgeon: Violeta Gelinas, MD;  Location: [facility];  Service: General;  Laterality: N/A;  HARMONIC SCALPEL   EXCISION OF SKIN TAG N/A 05/31/2023   Procedure: EXCISION OF ANAL SKIN TAG;  Surgeon: Violeta Gelinas, MD;  Location: [facility];  Service: General;  Laterality: N/A;   HAND SURGERY Left 2006   HEMORRHOID SURGERY     HEMORRHOID SURGERY N/A 05/31/2023   Procedure: INTERNAL AND  EXTERNAL HEMORRHOIDECTOMY;  Surgeon: Violeta Gelinas, MD;  Location:  S[facility]ERY C[facility];  Service: General;  Laterality: N/A;   HERNIA REPAIR  05/2018   umbilical   KNEE SURGERY Right 2011    NASAL FRACTURE SURGERY  2006   TOTAL KNEE ARTHROPLASTY Left 03/07/2020   Procedure: TOTAL KNEE ARTHROPLASTY;  Surgeon: Ollen Gross, MD;  Location: [facility];  Service: Orthopedics;  Laterality: Left;    TOTAL KNEE REVISION Left 11/15/2021   Procedure: Left knee tibial versus  total knee arthroplasty revision;  Surgeon: Ollen Gross, MD;  Location: [facility];  Service: Orthopedics;  Laterality: Left;    Current Outpatient Medications  Medication Instructions   albuterol (VENTOLIN HFA) 108 (90 Base) MCG/ACT inhaler 1-2 puffs, Inhalation, Every 6 hours PRN   aspirin EC 81 mg, Daily   atorvastatin (LIPITOR) 20 mg, Oral, Daily at bedtime   carvedilol (COREG) 12.5 mg, Oral, 2 times daily with meals   fish oil-omega-3 fatty acids 1,000 mg, Daily   gabapentin (NEURONTIN) 300 MG capsule Take a 300 mg capsule three times a day for two weeks following surgery.Then take a 300 mg capsule two times a day for two weeks. Then take a 300 mg capsule once a day for two weeks. Then discontinue.   glucose blood (ONETOUCH ULTRA) test strip USE TO CHECK BLOOD SUGAR TWICE DAILY   HYDROcodone-acetaminophen (NORCO) 10-325 MG tablet 1 tablet, Every 6 hours PRN   hydrocortisone (ANUSOL-HC) 25 mg, Rectal, Daily PRN   hydrocortisone 2.5 % cream Topical, 2 times daily   Lancets (ONETOUCH DELICA PLUS LANCET33G) MISC CHECK BLOOD SUGARS TWICE DAILY   losartan (COZAAR) 50 mg, Oral, Daily   metFORMIN (GLUCOPHAGE) 500 mg, Oral, 2 times daily with meals   methocarbamol (ROBAXIN) 500 mg, Oral, Every 6 hours PRN   Multiple Vitamin (MULTIVITAMIN WITH MINERALS) TABS tablet 1 tablet, Daily   oxyCODONE (OXY IR/ROXICODONE) 5-10 mg, Oral, Every 6 hours PRN  pantoprazole (PROTONIX) 40 mg, Oral, 2 times daily before meals   Probiotic Product (PROBIOTIC DAILY PO) 1 tablet, Daily   Red Yeast Rice 600 mg, Daily   tadalafil (CIALIS) 5 mg, Daily   traMADol (ULTRAM) 50-100 mg, Oral, Every 6 hours PRN   triamcinolone cream (KENALOG) 0.1 % 1 Application, 2 times daily   valACYclovir (VALTREX) 500 mg, Oral, Daily   zolpidem (AMBIEN CR) 12.5 MG CR tablet Take 1 tablet (12.5 mg total) by mouth at bedtime as needed for sleep.       Objective:   Physical Exam BP 120/82   Pulse 70   Temp 98 F (36.7 C) (Oral)    Resp 16   Ht 5\' 10"  (1.778 m)   Wt 209 lb 4 oz (94.9 kg)   SpO2 97%   BMI 30.02 kg/m  General:   Well developed, NAD, BMI noted. HEENT:  Normocephalic . Face symmetric, atraumatic Lungs:  CTA B Normal respiratory effort, no intercostal retractions, no accessory muscle use. Heart: RRR,  no murmur.  DM foot exam: No edema, good pedal pulses, pinprick examination normal Skin: Not pale. Not jaundice Neurologic:  alert & oriented X3.  Speech normal, gait appropriate for age and unassisted Psych--  Cognition and judgment appear intact.  Cooperative with normal attention span and concentration.  Behavior appropriate. No anxious or depressed appearing.      Assessment   Assessment  DM  HTN Hyperlipidemia Psych: --Anxiety, insomnia, History of Prozac intolerance "it may me like to hurt people". --PTSD (from major injury 01/2019) Asthma GERD Insomnia Hemorrhoids: History of surgery Polyarthralgia: w/u and rheumatology eval neg 2009, stopping zocor did not help Diarrhea, + c diff 08-2015, s/p abx  Major injury at work 01/2019, pain management per Ortho   PLAN DM: Last A1c was excellent, continue metformin, ambulatory CBGs ranged from 100-130, check A1c and micro.  Feet exam negative. HTN: On carvedilol, losartan.  Ambulatory BPs in the 120s.  Check BMP, CBC MSK: Still hurting, pain manage elsewhere, still able to stay active. Insomnia: See LOV, tried daridoxerant, didn't work for him, back on Ambien and sleeping well Preventive care: Requests vitamin levels check, vax advise provided:  COVID flu shot recently.  Recommended Tdap RTC 3 to 4 months CPX

## 2024-01-04 LAB — BASIC METABOLIC PANEL
BUN: 13 mg/dL (ref 7–25)
CO2: 26 mmol/L (ref 20–32)
Calcium: 9.5 mg/dL (ref 8.6–10.3)
Chloride: 102 mmol/L (ref 98–110)
Creat: 1.23 mg/dL (ref 0.70–1.30)
Glucose, Bld: 114 mg/dL — ABNORMAL HIGH (ref 65–99)
Potassium: 4.3 mmol/L (ref 3.5–5.3)
Sodium: 140 mmol/L (ref 135–146)

## 2024-01-04 LAB — HEMOGLOBIN A1C
Hgb A1c MFr Bld: 5.8 %{Hb} — ABNORMAL HIGH (ref <5.7)
Mean Plasma Glucose: 120 mg/dL
eAG (mmol/L): 6.6 mmol/L

## 2024-01-04 LAB — CBC WITH DIFFERENTIAL/PLATELET
Absolute Lymphocytes: 1991 {cells}/uL (ref 850–3900)
Absolute Monocytes: 407 {cells}/uL (ref 200–950)
Basophils Absolute: 28 {cells}/uL (ref 0–200)
Basophils Relative: 0.5 %
Eosinophils Absolute: 248 {cells}/uL (ref 15–500)
Eosinophils Relative: 4.5 %
HCT: 43.4 % (ref 38.5–50.0)
Hemoglobin: 15.4 g/dL (ref 13.2–17.1)
MCH: 31.7 pg (ref 27.0–33.0)
MCHC: 35.5 g/dL (ref 32.0–36.0)
MCV: 89.3 fL (ref 80.0–100.0)
MPV: 11.3 fL (ref 7.5–12.5)
Monocytes Relative: 7.4 %
Neutro Abs: 2827 {cells}/uL (ref 1500–7800)
Neutrophils Relative %: 51.4 %
Platelets: 153 10*3/uL (ref 140–400)
RBC: 4.86 10*6/uL (ref 4.20–5.80)
RDW: 12.9 % (ref 11.0–15.0)
Total Lymphocyte: 36.2 %
WBC: 5.5 10*3/uL (ref 3.8–10.8)

## 2024-01-04 LAB — B12 AND FOLATE PANEL
Folate: 23 ng/mL
Vitamin B-12: 879 pg/mL (ref 200–1100)

## 2024-01-04 LAB — VITAMIN D 25 HYDROXY (VIT D DEFICIENCY, FRACTURES): Vit D, 25-Hydroxy: 46 ng/mL (ref 30–100)

## 2024-01-05 NOTE — Assessment & Plan Note (Addendum)
DM: Last A1c was excellent, continue metformin, ambulatory CBGs ranged from 100-130, check A1c and micro.  Feet exam negative. HTN: On carvedilol, losartan.  Ambulatory BPs in the 120s.  Check BMP, CBC MSK: Still hurting, pain manage elsewhere, still able to stay active. Insomnia: See LOV, tried daridoxerant, didn't work for him, back on Ambien and sleeping well Preventive care: Requests vitamin levels check, vax advise provided:  COVID flu shot recently.  Recommended Tdap RTC 3 to 4 months CPX

## 2024-01-06 ENCOUNTER — Encounter: Payer: Self-pay | Admitting: Internal Medicine

## 2024-01-21 DIAGNOSIS — M47816 Spondylosis without myelopathy or radiculopathy, lumbar region: Secondary | ICD-10-CM | POA: Diagnosis not present

## 2024-01-21 DIAGNOSIS — Z79899 Other long term (current) drug therapy: Secondary | ICD-10-CM | POA: Diagnosis not present

## 2024-01-21 DIAGNOSIS — M7918 Myalgia, other site: Secondary | ICD-10-CM | POA: Diagnosis not present

## 2024-01-21 DIAGNOSIS — M25562 Pain in left knee: Secondary | ICD-10-CM | POA: Diagnosis not present

## 2024-01-21 DIAGNOSIS — M545 Low back pain, unspecified: Secondary | ICD-10-CM | POA: Diagnosis not present

## 2024-01-21 DIAGNOSIS — G8929 Other chronic pain: Secondary | ICD-10-CM | POA: Diagnosis not present

## 2024-01-21 DIAGNOSIS — Z79891 Long term (current) use of opiate analgesic: Secondary | ICD-10-CM | POA: Diagnosis not present

## 2024-01-23 ENCOUNTER — Other Ambulatory Visit: Payer: Self-pay | Admitting: Internal Medicine

## 2024-02-03 ENCOUNTER — Telehealth: Payer: Self-pay | Admitting: Internal Medicine

## 2024-02-03 DIAGNOSIS — G4700 Insomnia, unspecified: Secondary | ICD-10-CM

## 2024-02-03 DIAGNOSIS — R3912 Poor urinary stream: Secondary | ICD-10-CM | POA: Diagnosis not present

## 2024-02-04 NOTE — Telephone Encounter (Signed)
Requesting: Ambien CR 12.5mg   Contract: Under contract w/ pain medicine UDS: Under contract w/ pain medicine Last Visit: 01/03/24 Next Visit: 05/04/24 Last Refill: 11/05/23 #30 and 2RF   Please Advise

## 2024-02-04 NOTE — Telephone Encounter (Signed)
 PDMP okay, Rx sent

## 2024-02-23 ENCOUNTER — Other Ambulatory Visit: Payer: Self-pay | Admitting: Internal Medicine

## 2024-03-08 ENCOUNTER — Other Ambulatory Visit: Payer: Self-pay | Admitting: Internal Medicine

## 2024-04-21 DIAGNOSIS — M549 Dorsalgia, unspecified: Secondary | ICD-10-CM | POA: Diagnosis not present

## 2024-04-21 DIAGNOSIS — Z79899 Other long term (current) drug therapy: Secondary | ICD-10-CM | POA: Diagnosis not present

## 2024-04-21 DIAGNOSIS — G8929 Other chronic pain: Secondary | ICD-10-CM | POA: Diagnosis not present

## 2024-04-21 DIAGNOSIS — M7918 Myalgia, other site: Secondary | ICD-10-CM | POA: Diagnosis not present

## 2024-04-21 DIAGNOSIS — M47814 Spondylosis without myelopathy or radiculopathy, thoracic region: Secondary | ICD-10-CM | POA: Diagnosis not present

## 2024-04-27 ENCOUNTER — Encounter (HOSPITAL_COMMUNITY): Payer: Self-pay

## 2024-05-04 ENCOUNTER — Ambulatory Visit (INDEPENDENT_AMBULATORY_CARE_PROVIDER_SITE_OTHER): Payer: Medicare Other | Admitting: Internal Medicine

## 2024-05-04 VITALS — BP 122/76 | HR 62 | Temp 98.3°F | Resp 12 | Ht 70.0 in | Wt 203.6 lb

## 2024-05-04 DIAGNOSIS — I10 Essential (primary) hypertension: Secondary | ICD-10-CM | POA: Diagnosis not present

## 2024-05-04 DIAGNOSIS — Z0000 Encounter for general adult medical examination without abnormal findings: Secondary | ICD-10-CM

## 2024-05-04 DIAGNOSIS — E118 Type 2 diabetes mellitus with unspecified complications: Secondary | ICD-10-CM | POA: Diagnosis not present

## 2024-05-04 DIAGNOSIS — Z23 Encounter for immunization: Secondary | ICD-10-CM | POA: Diagnosis not present

## 2024-05-04 NOTE — Progress Notes (Signed)
 Subjective:    Patient ID: Daniel [name], male    DOB: 18-Jan-1966, 58 y.o.   MRN: [id number]  DOS:  05/04/2024 Type of visit - description: Here for CPX  Here for CPX chronic medical problems addressed. Has a history of a back injury, is lately giving him some trouble, doing physical therapy.   Review of Systems  Other than above, a 14 point review of systems is negative     Past Medical History:  Diagnosis Date   Asthma    Depression    h/o   Diabetes mellitus    GERD (gastroesophageal reflux disease)    H/O Clostridium difficile infection 08/2015   Hyperlipidemia    Hypertension    Insomnia    Polyarthralgia 2009   blood work (-) CKs slightly elevated, bone san (-) saw rheumatology; continue w/ somptoms after holding zocor     Past Surgical History:  Procedure Laterality Date   CERVICAL SPINE SURGERY  05/31/2019   operated on C4-C5   EVALUATION UNDER ANESTHESIA WITH HEMORRHOIDECTOMY N/A 05/31/2023   Procedure: EXAM UNDER ANESTHESIA WITH HEMORRHOIDECTOMY;  Surgeon: Dorena Gander, MD;  Location: [facility];  Service: General;  Laterality: N/A;  HARMONIC SCALPEL   EXCISION OF SKIN TAG N/A 05/31/2023   Procedure: EXCISION OF ANAL SKIN TAG;  Surgeon: Dorena Gander, MD;  Location: [facility];  Service: General;  Laterality: N/A;   HAND SURGERY Left 2006   HEMORRHOID SURGERY     HEMORRHOID SURGERY N/A 05/31/2023   Procedure: INTERNAL AND  EXTERNAL HEMORRHOIDECTOMY;  Surgeon: Dorena Gander, MD;  Location: [facility];  Service: General;  Laterality: N/A;   HERNIA REPAIR  05/2018   umbilical   KNEE SURGERY Right 2011    NASAL FRACTURE SURGERY  2006   TOTAL KNEE ARTHROPLASTY Left 03/07/2020   Procedure: TOTAL KNEE ARTHROPLASTY;  Surgeon: Liliane Rei, MD;  Location: [facility];  Service: Orthopedics;  Laterality: Left;    TOTAL KNEE REVISION Left 11/15/2021   Procedure: Left knee tibial versus total knee arthroplasty  revision;  Surgeon: Liliane Rei, MD;  Location: [facility];  Service: Orthopedics;  Laterality: Left;   Social History   Socioeconomic History   Marital status: Married    Spouse name: Not on file   Number of children: 1   Years of education: Not on file   Highest education level: 12th grade  Occupational History   Occupation: disable, d/t job injury 01-2019; Location manager at [employer]   Occupation: retired Librarian, academic  Tobacco Use   Smoking status: Never   Smokeless tobacco: Never   Tobacco comments:    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  Vaping Use   Vaping status: Never Used  Substance and Sexual Activity   Alcohol use: Yes    Alcohol/week: 6.0 standard drinks of alcohol    Types: 6 Cans of beer per week   Drug use: No   Sexual activity: Not on file  Other Topics Concern   Not on file  Social History Narrative   Married, 1 adopted child   Household:pt and wife             Social Drivers of Corporate investment banker Strain: Low Risk  (05/04/2024)   Overall Financial Resource Strain (CARDIA)    Difficulty of Paying Living Expenses: Not hard at all  Food Insecurity: No Food Insecurity (05/04/2024)   Hunger Vital Sign    Worried About Running  Out of Food in the Last Year: Never true    Ran Out of Food in the Last Year: Never true  Transportation Needs: No Transportation Needs (05/04/2024)   PRAPARE - Administrator, Civil Service (Medical): No    Lack of Transportation (Non-Medical): No  Physical Activity: Sufficiently Active (05/04/2024)   Exercise Vital Sign    Days of Exercise per Week: 6 days    Minutes of Exercise per Session: 80 min  Stress: No Stress Concern Present (05/04/2024)   [employer] of Occupational Health - Occupational Stress Questionnaire    Feeling of Stress : Not at all  Social Connections: Moderately Isolated (05/04/2024)   Social Connection and Isolation Panel [NHANES]    Frequency of Communication  with Friends and Family: More than three times a week    Frequency of Social Gatherings with Friends and Family: Patient declined    Attends Religious Services: Never    Database administrator or Organizations: No    Attends Banker Meetings: Never    Marital Status: Married  Catering manager Violence: Not At Risk (11/07/2023)   Humiliation, Afraid, Rape, and Kick questionnaire    Fear of Current or Ex-Partner: No    Emotionally Abused: No    Physically Abused: No    Sexually Abused: No    Current Outpatient Medications  Medication Instructions   albuterol  (VENTOLIN  HFA) 108 (90 Base) MCG/ACT inhaler 1-2 puffs, Inhalation, Every 6 hours PRN   aspirin  EC 81 mg, Daily   atorvastatin  (LIPITOR) 20 mg, Oral, Daily at bedtime   carvedilol  (COREG ) 12.5 mg, Oral, 2 times daily with meals   fish oil-omega-3 fatty acids 1,000 mg, Daily   gabapentin  (NEURONTIN ) 300 MG capsule Take a 300 mg capsule three times a day for two weeks following surgery.Then take a 300 mg capsule two times a day for two weeks. Then take a 300 mg capsule once a day for two weeks. Then discontinue.   glucose blood (ONETOUCH ULTRA) test strip USE TO CHECK BLOOD SUGAR TWICE DAILY   HYDROcodone -acetaminophen  (NORCO) 10-325 MG tablet 1 tablet, Every 6 hours PRN   Lancets (ONETOUCH DELICA PLUS LANCET33G) MISC CHECK BLOOD SUGARS TWICE DAILY   losartan  (COZAAR ) 50 mg, Oral, Daily   metFORMIN  (GLUCOPHAGE ) 500 mg, Oral, 2 times daily with meals   methocarbamol  (ROBAXIN ) 500 mg, Oral, Every 6 hours PRN   Multiple Vitamin (MULTIVITAMIN WITH MINERALS) TABS tablet 1 tablet, Daily   oxyCODONE  (OXY IR/ROXICODONE ) 5-10 mg, Oral, Every 6 hours PRN   pantoprazole  (PROTONIX ) 40 mg, Oral, 2 times daily before meals   Probiotic Product (PROBIOTIC DAILY PO) 1 tablet, Daily   Red Yeast Rice 600 mg, Daily   tadalafil (CIALIS) 5 mg, Daily   traMADol  (ULTRAM ) 50-100 mg, Oral, Every 6 hours PRN   triamcinolone cream (KENALOG) 0.1 %  1 Application, 2 times daily   valACYclovir  (VALTREX ) 500 mg, Oral, Daily   zolpidem  (AMBIEN  CR) 12.5 MG CR tablet TAKE 1 TABLET(12.5 MG) BY MOUTH AT BEDTIME AS NEEDED FOR SLEEP       Objective:   Physical Exam BP 122/76 (BP Location: Right Arm, Patient Position: Sitting, Cuff Size: Normal)   Pulse 62   Temp 98.3 F (36.8 C) (Oral)   Resp 12   Ht 5\' 10"  (1.778 m)   Wt 203 lb 9.6 oz (92.4 kg)   SpO2 95%   BMI 29.21 kg/m  General: Well developed, NAD, BMI noted Neck: No  thyromegaly  HEENT:  Normocephalic . Face symmetric, atraumatic Lungs:  CTA B Normal respiratory effort, no intercostal retractions, no accessory muscle use. Heart: RRR,  no murmur.  Abdomen:  Not distended, soft, non-tender. No rebound or rigidity.   Lower extremities: no pretibial edema bilaterally  Skin: Exposed areas without rash. Not pale. Not jaundice Neurologic:  alert & oriented X3.  Speech normal, gait appropriate for age and unassisted.  Transferring somewhat anthology is due to back pain Strength symmetric and appropriate for age.  Psych: Cognition and judgment appear intact.  Cooperative with normal attention span and concentration.  Behavior appropriate. No anxious or depressed appearing.     Assessment   Assessment  DM  HTN Hyperlipidemia Psych: --Anxiety, insomnia, History of Prozac intolerance "it may me like to hurt people". --PTSD (from major injury 01/2019) Asthma GERD Insomnia Hemorrhoids: History of surgery Polyarthralgia: w/u and rheumatology eval neg 2009, stopping zocor  did not help Diarrhea, + c diff 08-2015, s/p abx  Major injury at work 01/2019, pain management per Ortho   PLAN Here for CPX - Td 12- 2024 - pnm shot 2015;  prevnar 2017.  PNM 20: Today - s/p Shingrix   - Vaccine advised: Flu shot every fall, COVID booster if not done by September 2024 -CCS: Colonoscopy 09-2014, benign polyps, 10 years.  Patient aware he will be due soon, recommend to call  GI -Prostate cancer screening: Sees urology.. -Diet and exercise discussed  -Labs:   CMP A1c TSH - ACP - see AVS  Other issues addressed:  DM: Continue metformin , ambulatory CBG goals provided.  Check A1c HTN: On carvedilol , losartan , BP today is okay, recommend to check regularly at home.  Checking labs. Hyperlipidemia: Well-controlled per last FLP, on atorvastatin  20 mg. Anxiety insomnia: Well-controlled, continue Ambien  Asthma: Uses albuterol  only preexercise RTC 4 to 5 months

## 2024-05-04 NOTE — Patient Instructions (Addendum)
 You got a pneumonia shot today.  Vaccines to consider: Flu shot every fall A COVID booster if not 06 September 2023.  You will be due for a colonoscopy in October. Recommend to call gastroenterology and set that up, Dr. Bridgett Camps. [phone].   Check the  blood pressure regularly Blood pressure goal:  between 110/65 and  135/85. If it is consistently higher or lower, let me know     GO TO THE LAB : Get the blood work     Next office visit for a checkup in 4 to 5 months.  Please make an appointment before you leave today

## 2024-05-05 ENCOUNTER — Encounter: Payer: Self-pay | Admitting: Internal Medicine

## 2024-05-05 LAB — COMPREHENSIVE METABOLIC PANEL WITH GFR
ALT: 17 U/L (ref 0–53)
AST: 16 U/L (ref 0–37)
Albumin: 4.6 g/dL (ref 3.5–5.2)
Alkaline Phosphatase: 87 U/L (ref 39–117)
BUN: 14 mg/dL (ref 6–23)
CO2: 27 meq/L (ref 19–32)
Calcium: 9.7 mg/dL (ref 8.4–10.5)
Chloride: 101 meq/L (ref 96–112)
Creatinine, Ser: 1.34 mg/dL (ref 0.40–1.50)
GFR: 57.8 mL/min — ABNORMAL LOW (ref >60.00)
Glucose, Bld: 96 mg/dL (ref 70–99)
Potassium: 4.4 meq/L (ref 3.5–5.1)
Sodium: 139 meq/L (ref 135–145)
Total Bilirubin: 1.5 mg/dL — ABNORMAL HIGH (ref 0.2–1.2)
Total Protein: 7.1 g/dL (ref 6.0–8.3)

## 2024-05-05 LAB — MICROALBUMIN / CREATININE URINE RATIO
Creatinine,U: 66.4 mg/dL
Microalb Creat Ratio: UNDETERMINED mg/g (ref 0.0–30.0)
Microalb, Ur: <0.7 mg/dL

## 2024-05-05 LAB — TSH: TSH: 1 u[IU]/mL (ref 0.35–5.50)

## 2024-05-05 LAB — HEMOGLOBIN A1C: Hgb A1c MFr Bld: 5.7 % (ref 4.6–6.5)

## 2024-05-05 NOTE — Assessment & Plan Note (Signed)
 Here for CPX   Other issues addressed:  DM: Continue metformin , ambulatory CBG goals provided.  Check A1c HTN: On carvedilol , losartan , BP today is okay, recommend to check regularly at home.  Checking labs. Hyperlipidemia: Well-controlled per last FLP, on atorvastatin  20 mg. Anxiety insomnia: Well-controlled, continue Ambien  Asthma: Uses albuterol  only preexercise RTC 4 to 5 months

## 2024-05-05 NOTE — Assessment & Plan Note (Signed)
 Here for CPX - Td 12- 2024 - pnm shot 2015;  prevnar 2017.  PNM 20: Today - s/p Shingrix   - Vaccine advised: Flu shot every fall, COVID booster if not done by September 2024 -CCS: Colonoscopy 09-2014, benign polyps, 10 years.  Patient aware he will be due soon, recommend to call GI -Prostate cancer screening: Sees urology.. -Diet and exercise discussed  -Labs:   CMP A1c TSH - ACP - see AVS

## 2024-05-06 ENCOUNTER — Ambulatory Visit: Payer: Self-pay | Admitting: Internal Medicine

## 2024-05-06 DIAGNOSIS — M546 Pain in thoracic spine: Secondary | ICD-10-CM | POA: Diagnosis not present

## 2024-05-12 DIAGNOSIS — M546 Pain in thoracic spine: Secondary | ICD-10-CM | POA: Diagnosis not present

## 2024-05-18 DIAGNOSIS — M546 Pain in thoracic spine: Secondary | ICD-10-CM | POA: Diagnosis not present

## 2024-05-22 DIAGNOSIS — M546 Pain in thoracic spine: Secondary | ICD-10-CM | POA: Diagnosis not present

## 2024-05-25 DIAGNOSIS — M546 Pain in thoracic spine: Secondary | ICD-10-CM | POA: Diagnosis not present

## 2024-05-27 ENCOUNTER — Telehealth: Payer: Self-pay | Admitting: Internal Medicine

## 2024-05-27 DIAGNOSIS — G4700 Insomnia, unspecified: Secondary | ICD-10-CM

## 2024-05-27 NOTE — Telephone Encounter (Signed)
 PDMP okay, Rx sent

## 2024-05-27 NOTE — Telephone Encounter (Signed)
 Requesting: Ambien  CR 12.5mg   Contract: Under contract w/ pain medicine UDS: Under contract w/ pain medicine Last Visit: 05/04/24 Next Visit: 09/14/24 Last Refill: 02/04/24 #30 and 3rf   Please Advise

## 2024-05-29 DIAGNOSIS — M546 Pain in thoracic spine: Secondary | ICD-10-CM | POA: Diagnosis not present

## 2024-06-08 DIAGNOSIS — M546 Pain in thoracic spine: Secondary | ICD-10-CM | POA: Diagnosis not present

## 2024-06-11 ENCOUNTER — Other Ambulatory Visit: Payer: Self-pay | Admitting: Internal Medicine

## 2024-06-15 DIAGNOSIS — Z79899 Other long term (current) drug therapy: Secondary | ICD-10-CM | POA: Diagnosis not present

## 2024-06-15 DIAGNOSIS — M47814 Spondylosis without myelopathy or radiculopathy, thoracic region: Secondary | ICD-10-CM | POA: Diagnosis not present

## 2024-06-22 DIAGNOSIS — M546 Pain in thoracic spine: Secondary | ICD-10-CM | POA: Diagnosis not present

## 2024-06-24 DIAGNOSIS — M546 Pain in thoracic spine: Secondary | ICD-10-CM | POA: Diagnosis not present

## 2024-07-01 DIAGNOSIS — M546 Pain in thoracic spine: Secondary | ICD-10-CM | POA: Diagnosis not present

## 2024-07-09 DIAGNOSIS — M47814 Spondylosis without myelopathy or radiculopathy, thoracic region: Secondary | ICD-10-CM | POA: Diagnosis not present

## 2024-07-14 ENCOUNTER — Other Ambulatory Visit: Payer: Self-pay | Admitting: Internal Medicine

## 2024-07-27 DIAGNOSIS — M47814 Spondylosis without myelopathy or radiculopathy, thoracic region: Secondary | ICD-10-CM | POA: Diagnosis not present

## 2024-08-18 ENCOUNTER — Other Ambulatory Visit: Payer: Self-pay | Admitting: Internal Medicine

## 2024-08-18 ENCOUNTER — Telehealth: Payer: Self-pay | Admitting: Internal Medicine

## 2024-08-18 DIAGNOSIS — G4700 Insomnia, unspecified: Secondary | ICD-10-CM

## 2024-08-18 NOTE — Telephone Encounter (Signed)
 PDMP okay, Rx sent

## 2024-08-18 NOTE — Telephone Encounter (Signed)
 Requesting: Ambien  CR 12.5mg  Contract: under contract w/ pain med UDS: under contract w/ pain med Last Visit: 05/04/24 Next Visit:09/14/24 Last Refill: 05/27/24 #30 and 2rf  Please Advise

## 2024-08-21 DIAGNOSIS — M47814 Spondylosis without myelopathy or radiculopathy, thoracic region: Secondary | ICD-10-CM | POA: Diagnosis not present

## 2024-09-04 DIAGNOSIS — M47814 Spondylosis without myelopathy or radiculopathy, thoracic region: Secondary | ICD-10-CM | POA: Diagnosis not present

## 2024-09-14 ENCOUNTER — Encounter: Payer: Self-pay | Admitting: Internal Medicine

## 2024-09-14 ENCOUNTER — Ambulatory Visit: Admitting: Internal Medicine

## 2024-09-14 VITALS — BP 124/82 | HR 74 | Temp 98.2°F | Resp 16 | Ht 70.0 in | Wt 198.1 lb

## 2024-09-14 DIAGNOSIS — I10 Essential (primary) hypertension: Secondary | ICD-10-CM

## 2024-09-14 DIAGNOSIS — E118 Type 2 diabetes mellitus with unspecified complications: Secondary | ICD-10-CM | POA: Diagnosis not present

## 2024-09-14 DIAGNOSIS — Z7984 Long term (current) use of oral hypoglycemic drugs: Secondary | ICD-10-CM

## 2024-09-14 DIAGNOSIS — E785 Hyperlipidemia, unspecified: Secondary | ICD-10-CM | POA: Diagnosis not present

## 2024-09-14 DIAGNOSIS — Z1211 Encounter for screening for malignant neoplasm of colon: Secondary | ICD-10-CM

## 2024-09-14 MED ORDER — ACCU-CHEK SOFTCLIX LANCETS MISC: 12 refills | Status: AC

## 2024-09-14 MED ORDER — ACCU-CHEK GUIDE TEST VI STRP: ORAL_STRIP | 12 refills | Status: AC

## 2024-09-14 MED ORDER — ACCU-CHEK GUIDE W/DEVICE KIT: PACK | 0 refills | Status: AC

## 2024-09-14 NOTE — Patient Instructions (Addendum)
 GO TO THE LAB :  Get the blood work    Then, go to the front desk for the checkout Please make an appointment  for a check up in 5 to 6 months    Call gastroenterology to schedule a colonoscopy: [phone]    Check the  blood pressure regularly Blood pressure goal:  between 110/65 and  135/85. If it is consistently higher or lower, let me know  Diabetes  You can check your sugars at different times  - early in AM fasting  ( blood sugar goal 70-130) - 2 hours after a meal (blood sugar goal less than 180)

## 2024-09-14 NOTE — Progress Notes (Signed)
 Subjective:    Patient ID: Daniel [name], male    DOB: [birth year], 58 y.o.   MRN: [id number]  DOS:  09/14/2024 Discussed the use of AI scribe software for clinical note transcription with the patient, who gave verbal consent to proceed.  History of Present Illness Daniel [name] is a 58 year old male with chronic back pain who presents for a routine checkup.  Chronic back pain - Chronic back pain, had 2 procedures done, still sore after the last procedure.    - Pain limits ability to exercise and lift weights - Gabapentin  used for pain management - No tingling or numbness in the feet  Glycemic control - Blood glucose managed with metformin  - Home glucose levels range from 90 to 130 mg/dL - Requires change in glucose meter and test strips due to insurance, needs Contour Plus Blue or Contour Next Gen meter  Hypertension - Blood pressure managed with losartan  and carvedilol  - Home blood pressure readings in the 120s/80s  Hyperlipidemia - Cholesterol managed with atorvastatin   Gastroesophageal reflux symptoms - Uses Protonix  for acid reduction  Sleep disturbance - Uses medication for sleep, which is generally effective   Review of Systems See above   Past Medical History:  Diagnosis Date   Asthma    Depression    h/o   Diabetes mellitus    GERD (gastroesophageal reflux disease)    H/O Clostridium difficile infection 08/2015   Hyperlipidemia    Hypertension    Insomnia    Polyarthralgia 2009   blood work (-) CKs slightly elevated, bone san (-) saw rheumatology; continue w/ somptoms after holding zocor     Past Surgical History:  Procedure Laterality Date   CERVICAL SPINE SURGERY  05/31/2019   operated on C4-C5   EVALUATION UNDER ANESTHESIA WITH HEMORRHOIDECTOMY N/A 05/31/2023   Procedure: EXAM UNDER ANESTHESIA WITH HEMORRHOIDECTOMY;  Surgeon: Sebastian Moles, MD;  Location: [facility];  Service: General;  Laterality: N/A;  HARMONIC SCALPEL    EXCISION OF SKIN TAG N/A 05/31/2023   Procedure: EXCISION OF ANAL SKIN TAG;  Surgeon: Sebastian Moles, MD;  Location: [facility];  Service: General;  Laterality: N/A;   HAND SURGERY Left 2006   HEMORRHOID SURGERY     HEMORRHOID SURGERY N/A 05/31/2023   Procedure: INTERNAL AND  EXTERNAL HEMORRHOIDECTOMY;  Surgeon: Sebastian Moles, MD;  Location: [facility];  Service: General;  Laterality: N/A;   HERNIA REPAIR  05/2018   umbilical   KNEE SURGERY Right 2011    NASAL FRACTURE SURGERY  2006   TOTAL KNEE ARTHROPLASTY Left 03/07/2020   Procedure: TOTAL KNEE ARTHROPLASTY;  Surgeon: Melodi Lerner, MD;  Location: [facility];  Service: Orthopedics;  Laterality: Left;    TOTAL KNEE REVISION Left 11/15/2021   Procedure: Left knee tibial versus total knee arthroplasty revision;  Surgeon: Melodi Lerner, MD;  Location: [facility];  Service: Orthopedics;  Laterality: Left;    Current Outpatient Medications  Medication Instructions   Accu-Chek Softclix Lancets lancets Check blood sugars once daily   albuterol  (VENTOLIN  HFA) 108 (90 Base) MCG/ACT inhaler 1-2 puffs, Inhalation, Every 6 hours PRN   aspirin  EC 81 mg, Daily   atorvastatin  (LIPITOR) 20 mg, Oral, Daily at bedtime   Blood Glucose Monitoring Suppl (ACCU-CHEK GUIDE) w/Device KIT Check blood sugars daily   carvedilol  (COREG ) 12.5 mg, Oral, 2 times daily with meals   fish oil-omega-3 fatty acids 1,000 mg, Daily   gabapentin  (NEURONTIN ) 300 MG capsule Take  a 300 mg capsule three times a day for two weeks following surgery.Then take a 300 mg capsule two times a day for two weeks. Then take a 300 mg capsule once a day for two weeks. Then discontinue.   glucose blood (ACCU-CHEK GUIDE TEST) test strip Check blood sugars once daily   HYDROcodone -acetaminophen  (NORCO) 10-325 MG tablet 1 tablet, Every 6 hours PRN   losartan  (COZAAR ) 50 MG tablet TAKE 1 TABLET(50 MG) BY MOUTH DAILY   metFORMIN  (GLUCOPHAGE ) 500 MG tablet TAKE 1  TABLET(500 MG) BY MOUTH TWICE DAILY WITH A MEAL   methocarbamol  (ROBAXIN ) 500 mg, Oral, Every 6 hours PRN   Multiple Vitamin (MULTIVITAMIN WITH MINERALS) TABS tablet 1 tablet, Daily   oxyCODONE  (OXY IR/ROXICODONE ) 5-10 mg, Oral, Every 6 hours PRN   pantoprazole  (PROTONIX ) 40 mg, Oral, 2 times daily before meals   Probiotic Product (PROBIOTIC DAILY PO) 1 tablet, Daily   Red Yeast Rice 600 mg, Daily   tadalafil (CIALIS) 5 mg, Daily   traMADol  (ULTRAM ) 50-100 mg, Oral, Every 6 hours PRN   triamcinolone cream (KENALOG) 0.1 % 1 Application, 2 times daily   valACYclovir  (VALTREX ) 500 mg, Oral, Daily   zolpidem  (AMBIEN  CR) 12.5 MG CR tablet TAKE 1 TABLET(12.5 MG) BY MOUTH AT BEDTIME AS NEEDED FOR SLEEP       Objective:   Physical Exam BP 124/82   Pulse 74   Temp 98.2 F (36.8 C) (Oral)   Resp 16   Ht 5' 10 (1.778 m)   Wt 198 lb 2 oz (89.9 kg)   SpO2 99%   BMI 28.43 kg/m  General:   Well developed, NAD, BMI noted. HEENT:  Normocephalic . Face symmetric, atraumatic Lungs:  CTA B Normal respiratory effort, no intercostal retractions, no accessory muscle use. Heart: RRR,  no murmur.  DM foot exam: No edema, good pedal pulses, pinprick examination normal Skin: Not pale. Not jaundice Neurologic:  alert & oriented X3.  Speech normal, gait appropriate for age and unassisted Psych--  Cognition and judgment appear intact.  Cooperative with normal attention span and concentration.  Behavior appropriate. No anxious or depressed appearing.      Assessment     Assessment  DM  HTN Hyperlipidemia Psych: --Anxiety, insomnia, History of Prozac intolerance it may me like to hurt people. --PTSD (from major injury 01/2019) Asthma GERD Insomnia Hemorrhoids: History of surgery Polyarthralgia: w/u and rheumatology eval neg 2009, stopping zocor  did not help Diarrhea, + c diff 08-2015, s/p abx  Major injury at work 01/2019, pain management per Ortho  Assessment & Plan MSK: per pain  management, on medicines; recently had a couple of procedures with some success Type 2 diabetes mellitus Blood glucose levels are well-controlled with metformin , with readings between 90 and 130 mg/dL. Insurance requires a change in glucose meter and test strips.  New Rx sent. - Order hemoglobin A1c test to monitor long-term glucose control. Essential hypertension Blood pressure is well-controlled with current medication regimen, with readings in the 120s/80s mmHg. - Continue current antihypertensive medications: losartan  50 mg daily and carvedilol  12.5 mg twice daily. Hyperlipidemia Lipid levels need to be monitored to assess control with atorvastatin  20 mg. - Order lipid panel to evaluate cholesterol levels. General Health Maintenance Plans to get a flu shot and COVID vaccine at the pharmacy. - Referral to [facility] for colonoscopy as he is due this year.  See AVS RTC approximately 5 months for a routine checkup

## 2024-09-15 LAB — CBC WITH DIFFERENTIAL/PLATELET
Basophils Absolute: 0.1 K/uL (ref 0.0–0.1)
Basophils Relative: 0.8 % (ref 0.0–3.0)
Eosinophils Absolute: 0.3 K/uL (ref 0.0–0.7)
Eosinophils Relative: 4.7 % (ref 0.0–5.0)
HCT: 45.8 % (ref 39.0–52.0)
Hemoglobin: 15.8 g/dL (ref 13.0–17.0)
Lymphocytes Relative: 35.4 % (ref 12.0–46.0)
Lymphs Abs: 2.1 K/uL (ref 0.7–4.0)
MCHC: 34.5 g/dL (ref 30.0–36.0)
MCV: 91.4 fl (ref 78.0–100.0)
Monocytes Absolute: 0.3 K/uL (ref 0.1–1.0)
Monocytes Relative: 5.4 % (ref 3.0–12.0)
Neutro Abs: 3.2 K/uL (ref 1.4–7.7)
Neutrophils Relative %: 53.7 % (ref 43.0–77.0)
Platelets: 158 K/uL (ref 150.0–400.0)
RBC: 5.01 Mil/uL (ref 4.22–5.81)
RDW: 13.2 % (ref 11.5–15.5)
WBC: 6 K/uL (ref 4.0–10.5)

## 2024-09-15 LAB — BASIC METABOLIC PANEL WITH GFR
BUN: 13 mg/dL (ref 6–23)
CO2: 30 meq/L (ref 19–32)
Calcium: 10 mg/dL (ref 8.4–10.5)
Chloride: 101 meq/L (ref 96–112)
Creatinine, Ser: 1.32 mg/dL (ref 0.40–1.50)
GFR: 58.7 mL/min — ABNORMAL LOW (ref >60.00)
Glucose, Bld: 126 mg/dL — ABNORMAL HIGH (ref 70–99)
Potassium: 4.1 meq/L (ref 3.5–5.1)
Sodium: 140 meq/L (ref 135–145)

## 2024-09-15 LAB — LIPID PANEL
Cholesterol: 135 mg/dL (ref 0–200)
HDL: 36.1 mg/dL — ABNORMAL LOW (ref >39.00)
LDL Cholesterol: 59 mg/dL (ref 0–99)
NonHDL: 98.86
Total CHOL/HDL Ratio: 4
Triglycerides: 200 mg/dL — ABNORMAL HIGH (ref 0.0–149.0)
VLDL: 40 mg/dL (ref 0.0–40.0)

## 2024-09-15 LAB — HEMOGLOBIN A1C: Hgb A1c MFr Bld: 5.7 % (ref 4.6–6.5)

## 2024-09-15 NOTE — Assessment & Plan Note (Signed)
 MSK: per pain management, on medicines; recently had a couple of procedures with some success Type 2 diabetes mellitus Blood glucose levels are well-controlled with metformin , with readings between 90 and 130 mg/dL. Insurance requires a change in glucose meter and test strips.  New Rx sent. - Order hemoglobin A1c test to monitor long-term glucose control. Essential hypertension Blood pressure is well-controlled with current medication regimen, with readings in the 120s/80s mmHg. - Continue current antihypertensive medications: losartan  50 mg daily and carvedilol  12.5 mg twice daily. Hyperlipidemia Lipid levels need to be monitored to assess control with atorvastatin  20 mg. - Order lipid panel to evaluate cholesterol levels. General Health Maintenance Plans to get a flu shot and COVID vaccine at the pharmacy. - Referral to [facility] for colonoscopy as he is due this year.  See AVS RTC approximately 5 months for a routine checkup

## 2024-09-17 ENCOUNTER — Ambulatory Visit: Payer: Self-pay | Admitting: Internal Medicine

## 2024-09-22 DIAGNOSIS — M47814 Spondylosis without myelopathy or radiculopathy, thoracic region: Secondary | ICD-10-CM | POA: Diagnosis not present

## 2024-09-22 DIAGNOSIS — M5481 Occipital neuralgia: Secondary | ICD-10-CM | POA: Diagnosis not present

## 2024-09-22 DIAGNOSIS — Z79899 Other long term (current) drug therapy: Secondary | ICD-10-CM | POA: Diagnosis not present

## 2024-09-23 ENCOUNTER — Encounter: Payer: Self-pay | Admitting: Internal Medicine

## 2024-10-05 DIAGNOSIS — Z01812 Encounter for preprocedural laboratory examination: Secondary | ICD-10-CM | POA: Diagnosis not present

## 2024-10-05 DIAGNOSIS — M5481 Occipital neuralgia: Secondary | ICD-10-CM | POA: Diagnosis not present

## 2024-10-20 ENCOUNTER — Other Ambulatory Visit: Payer: Self-pay | Admitting: Internal Medicine

## 2024-10-20 DIAGNOSIS — G4700 Insomnia, unspecified: Secondary | ICD-10-CM

## 2024-10-21 ENCOUNTER — Ambulatory Visit

## 2024-10-21 ENCOUNTER — Encounter

## 2024-10-21 VITALS — Ht 70.0 in | Wt 200.4 lb

## 2024-10-21 DIAGNOSIS — Z1211 Encounter for screening for malignant neoplasm of colon: Secondary | ICD-10-CM

## 2024-10-21 MED ORDER — NA SULFATE-K SULFATE-MG SULF 17.5-3.13-1.6 GM/177ML PO SOLN: 1.0000 | Freq: Once | ORAL | 0 refills | Status: AC

## 2024-10-21 NOTE — Progress Notes (Signed)
 PCP MD at time of PV: Aloysius Mech, MD __________________________________________________________________________________________________________________________________________  No egg allergy known to patient  No soy allergy known to patient No issues known to pt with past sedation with any surgeries or procedures Patient denies ever being told they had issues or difficulty with intubation  No FH of Malignant Hyperthermia Pt is not on diet pills Pt is not on  home 02  Pt is not on blood thinners  No A fib or A flutter Have any cardiac testing pending--no  LOA: independent  No Chew or Snuff tobacco __________________________________________________________________________________________________________________________________________  Constipation: no  Prep: suprep  __________________________________________________________________________________________________________________________________________  PV completed with patient. Prep instructions reviewed and provided during apt. Rx sent to preferred pharmacy.  __________________________________________________________________________________________________________________________________________  Patient's chart reviewed by Norleen Schillings CNRA prior to previsit and patient appropriate for the [facility].  Previsit completed and red dot placed by patient's name on their procedure day (on provider's schedule).

## 2024-10-30 ENCOUNTER — Encounter: Payer: Self-pay | Admitting: Internal Medicine

## 2024-11-05 ENCOUNTER — Encounter: Payer: Self-pay | Admitting: Internal Medicine

## 2024-11-05 ENCOUNTER — Encounter: Admitting: Internal Medicine

## 2024-11-05 ENCOUNTER — Ambulatory Visit: Admitting: Internal Medicine

## 2024-11-05 VITALS — BP 131/70 | HR 58 | Temp 97.2°F | Resp 16 | Ht 70.0 in | Wt 200.0 lb

## 2024-11-05 DIAGNOSIS — D123 Benign neoplasm of transverse colon: Secondary | ICD-10-CM

## 2024-11-05 DIAGNOSIS — Z1211 Encounter for screening for malignant neoplasm of colon: Secondary | ICD-10-CM | POA: Diagnosis not present

## 2024-11-05 DIAGNOSIS — K5730 Diverticulosis of large intestine without perforation or abscess without bleeding: Secondary | ICD-10-CM

## 2024-11-05 MED ORDER — DEXTROSE 5 % IV SOLN: INTRAVENOUS | Status: AC

## 2024-11-05 MED ORDER — SODIUM CHLORIDE 0.9 % IV SOLN: 500.0000 mL | Freq: Once | INTRAVENOUS | Status: DC

## 2024-11-05 NOTE — Op Note (Signed)
 [facility] Patient Name: Daniel [name] Procedure Date: 11/05/2024 9:50 AM MRN: [id number] Endoscopist: Gordy CHRISTELLA Starch , MD, [id number] Age: 58 Referring MD:  Date of Birth: May 24, 1966 Gender: Male Account #: [id number] Procedure:                Colonoscopy Indications:              Screening for colorectal malignant neoplasm, Last                            colonoscopy 10 years ago Medicines:                Monitored Anesthesia Care Procedure:                Pre-Anesthesia Assessment:                           - Prior to the procedure, a History and Physical                            was performed, and patient medications and                            allergies were reviewed. [name] tolerance of                            previous anesthesia was also reviewed. The risks                            and benefits of the procedure and the sedation                            options and risks were discussed with the patient.                            All questions were answered, and informed consent                            was obtained. Prior Anticoagulants: The patient has                            taken no anticoagulant or antiplatelet agents. ASA                            Grade Assessment: II - A patient with mild systemic                            disease. After reviewing the risks and benefits,                            the patient was deemed in satisfactory condition to                            undergo the procedure.  After obtaining informed consent, the colonoscope                            was passed under direct vision. Throughout the                            procedure, the patient's blood pressure, pulse, and                            oxygen saturations were monitored continuously. The                            CF [id number] #[id number] was introduced through the anus                            and advanced to the cecum,  identified by                            appendiceal orifice and ileocecal valve. The                            colonoscopy was performed without difficulty. The                            patient tolerated the procedure well. The quality                            of the bowel preparation was good. The ileocecal                            valve, appendiceal orifice, and rectum were                            photographed. Scope In: 10:00:22 AM Scope Out: 10:13:38 AM Scope Withdrawal Time: 0 hours 10 minutes 52 seconds  Total Procedure Duration: 0 hours 13 minutes 16 seconds  Findings:                 The digital rectal exam was normal.                           Two sessile polyps were found in the transverse                            colon. The polyps were 4 to 5 mm in size. These                            polyps were removed with a cold snare. Resection                            and retrieval were complete.                           Multiple medium-mouthed and small-mouthed  diverticula were found in the sigmoid colon and                            descending colon.                           The exam was otherwise without abnormality on                            direct and retroflexion views. Complications:            No immediate complications. Estimated Blood Loss:     Estimated blood loss: none. Impression:               - Two 4 to 5 mm polyps in the transverse colon,                            removed with a cold snare. Resected and retrieved.                           - Mild diverticulosis in the sigmoid colon and in                            the descending colon.                           - The examination was otherwise normal on direct                            and retroflexion views. Recommendation:           - Patient has a contact number available for                            emergencies. The signs and symptoms of potential                             delayed complications were discussed with the                            patient. Return to normal activities tomorrow.                            Written discharge instructions were provided to the                            patient.                           - Resume previous diet.                           - Continue present medications.                           - Await pathology results.                           -  Repeat colonoscopy is recommended. The                            colonoscopy date will be determined after pathology                            results from today's exam become available for                            review. Gordy CHRISTELLA Starch, MD 11/05/2024 10:17:59 AM This report has been signed electronically.

## 2024-11-05 NOTE — Progress Notes (Signed)
 GASTROENTEROLOGY PROCEDURE H&P NOTE   Primary Care Physician: Amon Aloysius BRAVO, MD    Reason for Procedure:  Colon cancer screening  Plan:    Colonoscopy  Patient is appropriate for endoscopic procedure(s) in the ambulatory ([facility]) setting.  The nature of the procedure, as well as the risks, benefits, and alternatives were carefully and thoroughly reviewed with the patient. Ample time for discussion and questions allowed.  All questions were answered. The patient understood, was satisfied, and agreed with the plan to proceed.    HPI: Daniel [name] is a 58 y.o. male who presents for screening colonoscopy.  Medical history as below.  Tolerated the prep.  No recent chest pain or shortness of breath.  No abdominal pain today.  Past Medical History:  Diagnosis Date   Asthma    Depression    h/o   Diabetes mellitus    GERD (gastroesophageal reflux disease)    H/O Clostridium difficile infection 08/2015   Hyperlipidemia    Hypertension    Insomnia    Polyarthralgia 2009   blood work (-) CKs slightly elevated, bone san (-) saw rheumatology; continue w/ somptoms after holding zocor     Past Surgical History:  Procedure Laterality Date   CERVICAL SPINE SURGERY  05/31/2019   operated on C4-C5   COLONOSCOPY     EVALUATION UNDER ANESTHESIA WITH HEMORRHOIDECTOMY N/A 05/31/2023   Procedure: EXAM UNDER ANESTHESIA WITH HEMORRHOIDECTOMY;  Surgeon: Sebastian Moles, MD;  Location: [facility];  Service: General;  Laterality: N/A;  HARMONIC SCALPEL   EXCISION OF SKIN TAG N/A 05/31/2023   Procedure: EXCISION OF ANAL SKIN TAG;  Surgeon: Sebastian Moles, MD;  Location: [facility];  Service: General;  Laterality: N/A;   HAND SURGERY Left 2006   HEMORRHOID SURGERY     HEMORRHOID SURGERY N/A 05/31/2023   Procedure: INTERNAL AND  EXTERNAL HEMORRHOIDECTOMY;  Surgeon: Sebastian Moles, MD;  Location: [facility];  Service: General;  Laterality: N/A;   HERNIA  REPAIR  05/2018   umbilical   KNEE SURGERY Right 2011   NASAL FRACTURE SURGERY  2006   TOTAL KNEE ARTHROPLASTY Left 03/07/2020   Procedure: TOTAL KNEE ARTHROPLASTY;  Surgeon: Melodi Lerner, MD;  Location: [facility];  Service: Orthopedics;  Laterality: Left;    TOTAL KNEE REVISION Left 11/15/2021   Procedure: Left knee tibial versus total knee arthroplasty revision;  Surgeon: Melodi Lerner, MD;  Location: [facility];  Service: Orthopedics;  Laterality: Left;    Prior to Admission medications   Medication Sig Start Date End Date Taking? Authorizing Provider  Accu-Chek Softclix Lancets lancets Check blood sugars once daily 09/14/24  Yes Paz, Aloysius BRAVO, MD  aspirin  EC 81 MG tablet Take 81 mg by mouth daily. Swallow whole.   Yes [provider]  atorvastatin  (LIPITOR) 20 MG tablet Take 1 tablet (20 mg total) by mouth at bedtime. 08/18/24  Yes Paz, Aloysius BRAVO, MD  Blood Glucose Monitoring Suppl (ACCU-CHEK GUIDE) w/Device KIT Check blood sugars daily 09/14/24  Yes Paz, Aloysius BRAVO, MD  carvedilol  (COREG ) 12.5 MG tablet Take 1 tablet (12.5 mg total) by mouth 2 (two) times daily with a meal. 08/18/24  Yes Paz, Aloysius BRAVO, MD  cyclobenzaprine  (FLEXERIL ) 10 MG tablet Take 10 mg by mouth as needed. 10/19/24  Yes [provider]  DULoxetine (CYMBALTA) 20 MG capsule Take 20 mg by mouth daily.   Yes [provider]  fish oil-omega-3 fatty acids 1000 MG capsule Take 1,000 mg  by mouth daily.   Yes [provider]  gabapentin  (NEURONTIN ) 300 MG capsule Take a 300 mg capsule three times a day for two weeks following surgery.Then take a 300 mg capsule two times a day for two weeks. Then take a 300 mg capsule once a day for two weeks. Then discontinue. 11/16/21  Yes Arnaldo Mallick D, PA-C  glucose blood (ACCU-CHEK GUIDE TEST) test strip Check blood sugars once daily 09/14/24  Yes Paz, Jose E, MD  losartan  (COZAAR ) 50 MG tablet TAKE 1 TABLET(50 MG) BY MOUTH DAILY 07/14/24  Yes Webb, Padonda B, FNP   metFORMIN  (GLUCOPHAGE ) 500 MG tablet TAKE 1 TABLET(500 MG) BY MOUTH TWICE DAILY WITH A MEAL 07/14/24  Yes Webb, Padonda B, FNP  Multiple Vitamin (MULTIVITAMIN WITH MINERALS) TABS tablet Take 1 tablet by mouth daily.   Yes [provider]  pantoprazole  (PROTONIX ) 40 MG tablet Take 1 tablet (40 mg total) by mouth 2 (two) times daily before a meal. 08/18/24  Yes Paz, Jose E, MD  Probiotic Product (PROBIOTIC DAILY PO) Take 1 tablet by mouth daily.    Yes [provider]  Red Yeast Rice 600 MG CAPS Take 600 mg by mouth daily.   Yes [provider]  tadalafil (CIALIS) 5 MG tablet Take 5 mg by mouth daily.   Yes [provider]  valACYclovir  (VALTREX ) 500 MG tablet Take 1 tablet (500 mg total) by mouth daily. 08/18/24  Yes Paz, Jose E, MD  zolpidem  (AMBIEN  CR) 12.5 MG CR tablet TAKE 1 TABLET(12.5 MG) BY MOUTH AT BEDTIME AS NEEDED FOR SLEEP 10/20/24  Yes Webb, Padonda B, FNP  albuterol  (VENTOLIN  HFA) 108 (90 Base) MCG/ACT inhaler Inhale 1-2 puffs into the lungs every 6 (six) hours as needed for wheezing or shortness of breath. 03/09/24   Amon Aloysius BRAVO, MD  HYDROcodone -acetaminophen  (NORCO) 10-325 MG tablet Take 1 tablet by mouth every 6 (six) hours as needed for severe pain.    [provider]  methocarbamol  (ROBAXIN ) 500 MG tablet Take 1 tablet (500 mg total) by mouth every 6 (six) hours as needed for muscle spasms. 11/16/21   Arnaldo Mallick D, PA-C  oxyCODONE  (OXY IR/ROXICODONE ) 5 MG immediate release tablet Take 1-2 tablets (5-10 mg total) by mouth every 6 (six) hours as needed for severe pain. 11/16/21   Childress, Sean D, PA-C  traMADol  (ULTRAM ) 50 MG tablet Take 1-2 tablets (50-100 mg total) by mouth every 6 (six) hours as needed for moderate pain. 11/16/21   Childress, Sean D, PA-C  triamcinolone cream (KENALOG) 0.1 % Apply 1 Application topically 2 (two) times daily.    [provider]    Current Outpatient Medications  Medication Sig Dispense Refill    Accu-Chek Softclix Lancets lancets Check blood sugars once daily 100 each 12   aspirin  EC 81 MG tablet Take 81 mg by mouth daily. Swallow whole.     atorvastatin  (LIPITOR) 20 MG tablet Take 1 tablet (20 mg total) by mouth at bedtime. 90 tablet 1   Blood Glucose Monitoring Suppl (ACCU-CHEK GUIDE) w/Device KIT Check blood sugars daily 1 kit 0   carvedilol  (COREG ) 12.5 MG tablet Take 1 tablet (12.5 mg total) by mouth 2 (two) times daily with a meal. 180 tablet 1   cyclobenzaprine  (FLEXERIL ) 10 MG tablet Take 10 mg by mouth as needed.     DULoxetine (CYMBALTA) 20 MG capsule Take 20 mg by mouth daily.     fish oil-omega-3 fatty acids 1000 MG capsule Take 1,000  mg by mouth daily.     gabapentin  (NEURONTIN ) 300 MG capsule Take a 300 mg capsule three times a day for two weeks following surgery.Then take a 300 mg capsule two times a day for two weeks. Then take a 300 mg capsule once a day for two weeks. Then discontinue. 84 capsule 0   glucose blood (ACCU-CHEK GUIDE TEST) test strip Check blood sugars once daily 100 each 12   losartan  (COZAAR ) 50 MG tablet TAKE 1 TABLET(50 MG) BY MOUTH DAILY 90 tablet 1   metFORMIN  (GLUCOPHAGE ) 500 MG tablet TAKE 1 TABLET(500 MG) BY MOUTH TWICE DAILY WITH A MEAL 180 tablet 1   Multiple Vitamin (MULTIVITAMIN WITH MINERALS) TABS tablet Take 1 tablet by mouth daily.     pantoprazole  (PROTONIX ) 40 MG tablet Take 1 tablet (40 mg total) by mouth 2 (two) times daily before a meal. 180 tablet 1   Probiotic Product (PROBIOTIC DAILY PO) Take 1 tablet by mouth daily.      Red Yeast Rice 600 MG CAPS Take 600 mg by mouth daily.     tadalafil (CIALIS) 5 MG tablet Take 5 mg by mouth daily.     valACYclovir  (VALTREX ) 500 MG tablet Take 1 tablet (500 mg total) by mouth daily. 90 tablet 1   zolpidem  (AMBIEN  CR) 12.5 MG CR tablet TAKE 1 TABLET(12.5 MG) BY MOUTH AT BEDTIME AS NEEDED FOR SLEEP 30 tablet 1   albuterol  (VENTOLIN  HFA) 108 (90 Base) MCG/ACT inhaler Inhale 1-2 puffs into the lungs  every 6 (six) hours as needed for wheezing or shortness of breath. 18 g 5   HYDROcodone -acetaminophen  (NORCO) 10-325 MG tablet Take 1 tablet by mouth every 6 (six) hours as needed for severe pain.     methocarbamol  (ROBAXIN ) 500 MG tablet Take 1 tablet (500 mg total) by mouth every 6 (six) hours as needed for muscle spasms. 40 tablet 0   oxyCODONE  (OXY IR/ROXICODONE ) 5 MG immediate release tablet Take 1-2 tablets (5-10 mg total) by mouth every 6 (six) hours as needed for severe pain. 42 tablet 0   traMADol  (ULTRAM ) 50 MG tablet Take 1-2 tablets (50-100 mg total) by mouth every 6 (six) hours as needed for moderate pain. 40 tablet 0   triamcinolone cream (KENALOG) 0.1 % Apply 1 Application topically 2 (two) times daily.     Current Facility-Administered Medications  Medication Dose Route Frequency Provider Last Rate Last Admin   0.9 %  sodium chloride  infusion  500 mL Intravenous Once Taylen Wendland, Gordy HERO, MD       dextrose  5 % solution   Intravenous Continuous Marcella Dunnaway, Gordy HERO, MD        Allergies as of 11/05/2024 - Review Complete 11/05/2024  Allergen Reaction Noted   Levofloxacin Other (See Comments) 05/03/2015    Family History  Problem Relation Age of Onset   Diabetes Mother        ??   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  Vaping Use    Vaping status: Never Used  Substance and Sexual Activity   Alcohol use: Yes    Alcohol/week: 6.0 standard drinks of alcohol    Types: 6 Cans of beer per week   Drug use: No   Sexual activity: Not on file  Other Topics Concern   Not on file  Social History Narrative   Married, 1 adopted child   Household:pt and wife             Social Drivers of Corporate Investment Banker Strain: Low Risk  (05/04/2024)   Overall Financial Resource Strain (CARDIA)    Difficulty of Paying Living Expenses: Not hard at all  Food Insecurity: No Food Insecurity (05/04/2024)   Hunger Vital Sign    Worried About Running Out of Food in the Last Year: Never true    Ran Out of Food in the Last Year: Never true  Transportation Needs: No Transportation Needs (05/04/2024)   PRAPARE - Administrator, Civil Service (Medical): No    Lack of Transportation (Non-Medical): No  Physical Activity: Sufficiently Active (05/04/2024)   Exercise Vital Sign    Days of Exercise per Week: 6 days    Minutes of Exercise per Session: 80 min  Stress: No Stress Concern Present (05/04/2024)   [employer] of Occupational Health - Occupational Stress Questionnaire    Feeling of Stress : Not at all  Social Connections: Moderately Isolated (05/04/2024)   Social Connection and Isolation Panel    Frequency of Communication with Friends and Family: More than three times a week    Frequency of Social Gatherings with Friends and Family: Patient declined    Attends Religious Services: Never    Database Administrator or Organizations: No    Attends Banker Meetings: Never    Marital Status: Married  Catering Manager Violence: Not At Risk (11/07/2023)   Humiliation, Afraid, Rape, and Kick questionnaire    Fear of Current or Ex-Partner: No    Emotionally Abused: No    Physically Abused: No    Sexually Abused: No    Physical Exam: Vital signs in last 24 hours: @BP  128/74   Pulse (!) 55   Temp (!) 97.2  F (36.2 C)   Resp 15   Ht 5' 10 (1.778 m)   Wt 200 lb (90.7 kg)   SpO2 96%   BMI 28.70 kg/m  GEN: NAD EYE: Sclerae anicteric ENT: MMM CV: Non-tachycardic Pulm: CTA b/l GI: Soft, NT/ND NEURO:  Alert & Oriented x 3   Gordy Starch, MD [facility]  11/05/2024 9:54 AM

## 2024-11-05 NOTE — Progress Notes (Signed)
 Called to room to assist during endoscopic procedure.  Patient ID and intended procedure confirmed with present staff. Received instructions for my participation in the procedure from the performing physician.

## 2024-11-05 NOTE — Patient Instructions (Signed)
Thank you for letting us take care of your healthcare needs today. Please see handouts given to you on Polyps and Diverticulosis.    YOU HAD AN ENDOSCOPIC PROCEDURE TODAY AT [facility]:   Refer to the procedure report that was given to you for any specific questions about what was found during the examination.  If the procedure report does not answer your questions, please call your gastroenterologist to clarify.  If you requested that your care partner not be given the details of your procedure findings, then the procedure report has been included in a sealed envelope for you to review at your convenience later.  YOU SHOULD EXPECT: Some feelings of bloating in the abdomen. Passage of more gas than usual.  Walking can help get rid of the air that was put into your GI tract during the procedure and reduce the bloating. If you had a lower endoscopy (such as a colonoscopy or flexible sigmoidoscopy) you may notice spotting of blood in your stool or on the toilet paper. If you underwent a bowel prep for your procedure, you may not have a normal bowel movement for a few days.  Please Note:  You might notice some irritation and congestion in your nose or some drainage.  This is from the oxygen used during your procedure.  There is no need for concern and it should clear up in a day or so.  SYMPTOMS TO REPORT IMMEDIATELY:  Following lower endoscopy (colonoscopy or flexible sigmoidoscopy):  Excessive amounts of blood in the stool  Significant tenderness or worsening of abdominal pains  Swelling of the abdomen that is new, acute  Fever of 100F or higher   For urgent or emergent issues, a gastroenterologist can be reached at any hour by calling [phone]. Do not use MyChart messaging for urgent concerns.    DIET:  We do recommend a small meal at first, but then you may proceed to your regular diet.  Drink plenty of fluids but you should avoid alcoholic beverages for 24  hours.  ACTIVITY:  You should plan to take it easy for the rest of today and you should NOT DRIVE or use heavy machinery until tomorrow (because of the sedation medicines used during the test).    FOLLOW UP: Our staff will call the number listed on your records the next business day following your procedure.  We will call around 7:15- 8:00 am to check on you and address any questions or concerns that you may have regarding the information given to you following your procedure. If we do not reach you, we will leave a message.     If any biopsies were taken you will be contacted by phone or by letter within the next 1-3 weeks.  Please call us at [phone] if you have not heard about the biopsies in 3 weeks.    SIGNATURES/CONFIDENTIALITY: You and/or your care partner have signed paperwork which will be entered into your electronic medical record.  These signatures attest to the fact that that the information above on your After Visit Summary has been reviewed and is understood.  Full responsibility of the confidentiality of this discharge information lies with you and/or your care-partner.

## 2024-11-05 NOTE — Progress Notes (Signed)
 Report to PACU, RN, vss, BBS= Clear.

## 2024-11-05 NOTE — Progress Notes (Signed)
 Pt's states no medical or surgical changes since previsit or office visit.  Pt's blood sugar 64 upon admission- pt states, I have a slight headache.  My sugars can run in the 60's when I'm working. D5 IVF hung.

## 2024-11-06 ENCOUNTER — Telehealth: Payer: Self-pay

## 2024-11-06 NOTE — Telephone Encounter (Signed)
 Follow up call to pt, no answer.

## 2024-11-09 LAB — SURGICAL PATHOLOGY

## 2024-11-16 ENCOUNTER — Ambulatory Visit: Payer: Self-pay | Admitting: Internal Medicine

## 2024-11-17 ENCOUNTER — Ambulatory Visit: Payer: Medicare Other

## 2024-11-17 VITALS — BP 122/62 | HR 60 | Temp 97.6°F | Ht 70.0 in | Wt 202.0 lb

## 2024-11-17 DIAGNOSIS — Z0000 Encounter for general adult medical examination without abnormal findings: Secondary | ICD-10-CM

## 2024-11-17 NOTE — Progress Notes (Signed)
 Chief Complaint  Patient presents with   Medicare Wellness     Subjective:   Daniel [name] is a 58 y.o. male who presents for a Medicare Annual Wellness Visit.  Visit info / Clinical Intake: Medicare Wellness Visit Type:: Subsequent Annual Wellness Visit Persons participating in visit and providing information:: patient Medicare Wellness Visit Mode:: In-person (required for WTM) Interpreter Needed?: No Pre-visit prep was completed: no AWV questionnaire completed by patient prior to visit?: no Living arrangements:: lives with spouse/significant other Patient's Overall Health Status Rating: very good Typical amount of pain: some (Back pain) Does pain affect daily life?: no Are you currently prescribed opioids?: (!) yes  Dietary Habits and Nutritional Risks How many meals a day?: 2 Eats fruit and vegetables daily?: yes Most meals are obtained by: preparing own meals In the last 2 weeks, have you had any of the following?: none Diabetic:: (!) yes Any non-healing wounds?: no How often do you check your BS?: 1 Would you like to be referred to a Nutritionist or for Diabetic Management? : no  Functional Status Activities of Daily Living (to include ambulation/medication): Independent Ambulation: Independent with device- listed below Home Assistive Devices/Equipment: Eyeglasses Medication Administration: Independent Home Management (perform basic housework or laundry): Independent Manage your own finances?: yes Primary transportation is: driving Concerns about vision?: no *vision screening is required for WTM* Concerns about hearing?: no  Fall Screening Falls in the past year?: 0 Number of falls in past year: 0 Was there an injury with Fall?: 0 Fall Risk Category Calculator: 0 Patient Fall Risk Level: Low Fall Risk  Fall Risk Patient at Risk for Falls Due to: No Fall Risks Fall risk Follow up: Falls evaluation completed; Education provided  Home and Transportation  Safety: All rugs have non-skid backing?: yes All stairs or steps have railings?: yes Grab bars in the bathtub or shower?: (!) no Have non-skid surface in bathtub or shower?: yes Good home lighting?: yes Regular seat belt use?: yes Hospital stays in the last year:: no  Cognitive Assessment Difficulty concentrating, remembering, or making decisions? : no Will 6CIT or Mini Cog be Completed: no 6CIT or Mini Cog Declined: patient alert, oriented, able to answer questions appropriately and recall recent events  Advance Directives (For Healthcare) Does Patient Have a Medical Advance Directive?: Yes Does patient want to make changes to medical advance directive?: No - Patient declined Type of Advance Directive: Healthcare Power of [place]; Living will Copy of Healthcare Power of Attorney in Chart?: No - copy requested Copy of Living Will in Chart?: No - copy requested  Reviewed/Updated  Reviewed/Updated: Reviewed All (Medical, Surgical, Family, Medications, Allergies, Care Teams, Patient Goals)    Allergies (verified) Levofloxacin   Current Medications (verified) Outpatient Encounter Medications as of 11/17/2024  Medication Sig   Accu-Chek Softclix Lancets lancets Check blood sugars once daily   albuterol  (VENTOLIN  HFA) 108 (90 Base) MCG/ACT inhaler Inhale 1-2 puffs into the lungs every 6 (six) hours as needed for wheezing or shortness of breath.   aspirin  EC 81 MG tablet Take 81 mg by mouth daily. Swallow whole.   atorvastatin  (LIPITOR) 20 MG tablet Take 1 tablet (20 mg total) by mouth at bedtime.   Blood Glucose Monitoring Suppl (ACCU-CHEK GUIDE) w/Device KIT Check blood sugars daily   carvedilol  (COREG ) 12.5 MG tablet Take 1 tablet (12.5 mg total) by mouth 2 (two) times daily with a meal.   cyclobenzaprine  (FLEXERIL ) 10 MG tablet Take 10 mg by mouth as needed.  DULoxetine (CYMBALTA) 20 MG capsule Take 20 mg by mouth daily.   fish oil-omega-3 fatty acids 1000 MG capsule Take 1,000  mg by mouth daily.   gabapentin  (NEURONTIN ) 300 MG capsule Take a 300 mg capsule three times a day for two weeks following surgery.Then take a 300 mg capsule two times a day for two weeks. Then take a 300 mg capsule once a day for two weeks. Then discontinue.   glucose blood (ACCU-CHEK GUIDE TEST) test strip Check blood sugars once daily   HYDROcodone -acetaminophen  (NORCO) 10-325 MG tablet Take 1 tablet by mouth every 6 (six) hours as needed for severe pain.   losartan  (COZAAR ) 50 MG tablet TAKE 1 TABLET(50 MG) BY MOUTH DAILY   metFORMIN  (GLUCOPHAGE ) 500 MG tablet TAKE 1 TABLET(500 MG) BY MOUTH TWICE DAILY WITH A MEAL   methocarbamol  (ROBAXIN ) 500 MG tablet Take 1 tablet (500 mg total) by mouth every 6 (six) hours as needed for muscle spasms.   Multiple Vitamin (MULTIVITAMIN WITH MINERALS) TABS tablet Take 1 tablet by mouth daily.   oxyCODONE  (OXY IR/ROXICODONE ) 5 MG immediate release tablet Take 1-2 tablets (5-10 mg total) by mouth every 6 (six) hours as needed for severe pain.   pantoprazole  (PROTONIX ) 40 MG tablet Take 1 tablet (40 mg total) by mouth 2 (two) times daily before a meal.   Probiotic Product (PROBIOTIC DAILY PO) Take 1 tablet by mouth daily.    Red Yeast Rice 600 MG CAPS Take 600 mg by mouth daily.   tadalafil (CIALIS) 5 MG tablet Take 5 mg by mouth daily.   traMADol  (ULTRAM ) 50 MG tablet Take 1-2 tablets (50-100 mg total) by mouth every 6 (six) hours as needed for moderate pain.   triamcinolone cream (KENALOG) 0.1 % Apply 1 Application topically 2 (two) times daily.   valACYclovir  (VALTREX ) 500 MG tablet Take 1 tablet (500 mg total) by mouth daily.   zolpidem  (AMBIEN  CR) 12.5 MG CR tablet TAKE 1 TABLET(12.5 MG) BY MOUTH AT BEDTIME AS NEEDED FOR SLEEP   No facility-administered encounter medications on file as of 11/17/2024.    History: Past Medical History:  Diagnosis Date   Asthma    Depression    h/o   Diabetes mellitus    GERD (gastroesophageal reflux disease)    H/O  Clostridium difficile infection 08/2015   Hyperlipidemia    Hypertension    Insomnia    Polyarthralgia 2009   blood work (-) CKs slightly elevated, bone san (-) saw rheumatology; continue w/ somptoms after holding zocor    Past Surgical History:  Procedure Laterality Date   CERVICAL SPINE SURGERY  05/31/2019   operated on C4-C5   COLONOSCOPY     EVALUATION UNDER ANESTHESIA WITH HEMORRHOIDECTOMY N/A 05/31/2023   Procedure: EXAM UNDER ANESTHESIA WITH HEMORRHOIDECTOMY;  Surgeon: Sebastian Moles, MD;  Location: [facility];  Service: General;  Laterality: N/A;  HARMONIC SCALPEL   EXCISION OF SKIN TAG N/A 05/31/2023   Procedure: EXCISION OF ANAL SKIN TAG;  Surgeon: Sebastian Moles, MD;  Location: [facility];  Service: General;  Laterality: N/A;   HAND SURGERY Left 2006   HEMORRHOID SURGERY     HEMORRHOID SURGERY N/A 05/31/2023   Procedure: INTERNAL AND  EXTERNAL HEMORRHOIDECTOMY;  Surgeon: Sebastian Moles, MD;  Location: [facility];  Service: General;  Laterality: N/A;   HERNIA REPAIR  05/2018   umbilical   KNEE SURGERY Right 2011   NASAL FRACTURE SURGERY  2006   TOTAL KNEE ARTHROPLASTY Left  03/07/2020   Procedure: TOTAL KNEE ARTHROPLASTY;  Surgeon: Melodi Lerner, MD;  Location: [facility];  Service: Orthopedics;  Laterality: Left;    TOTAL KNEE REVISION Left 11/15/2021   Procedure: Left knee tibial versus total knee arthroplasty revision;  Surgeon: Melodi Lerner, MD;  Location: [facility];  Service: Orthopedics;  Laterality: Left;   Family History  Problem Relation Age of Onset   Diabetes Mother        ??   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  SDOH Screenings   Food Insecurity: No Food Insecurity (11/17/2024)  Housing: Unknown (11/17/2024)  Transportation Needs: No Transportation Needs (11/17/2024)  Utilities: Not At Risk (11/17/2024)  Alcohol Screen: Low Risk  (05/04/2024)  Depression (PHQ2-9): Low Risk  (11/17/2024)  Financial Resource Strain: Low Risk  (05/04/2024)  Physical Activity: Sufficiently Active (11/17/2024)  Social Connections: Moderately Integrated (11/17/2024)  Stress: No Stress Concern Present (11/17/2024)  Tobacco Use: Low Risk  (11/17/2024)  Recent Concern: Tobacco Use - Medium Risk (10/29/2024)   Received from [facility] Literacy: Adequate Health Literacy (11/17/2024)   See flowsheets for full screening details  Depression Screen PHQ 2 & 9 Depression Scale- Over the past 2 weeks, how often have you been bothered by any of the following problems? Little interest or pleasure in doing things: 0 Feeling down, depressed, or hopeless (PHQ Adolescent also includes...irritable): 0 PHQ-2 Total Score: 0 Trouble falling or staying asleep, or sleeping too much: 1 Feeling tired or having little energy: 0 Poor appetite or overeating (PHQ Adolescent also includes...weight loss): 0 Feeling bad about yourself - or that you are a failure or have let yourself or your family down: 0 Trouble concentrating on things, such as reading the newspaper or watching television (PHQ Adolescent also  includes...like school work): 0 Moving or speaking so slowly that other people could have noticed. Or the opposite - being so fidgety or restless that you have been moving around a lot more than usual: 0 Thoughts that you would be better off dead, or of hurting yourself in some way: 0 PHQ-9 Total Score: 3     Goals Addressed               This Visit's Progress     Increase physical activity (pt-stated)        Remain active.             Objective:    Today's Vitals   11/17/24 1350  BP: 122/62  Pulse: 60  Temp: 97.6 F (36.4 C)  TempSrc: Oral  SpO2: 99%  Weight: 202 lb (91.6 kg)  Height: 5' 10 (1.778 m)   Body mass index is 28.98 kg/m.  Hearing/Vision screen Hearing Screening - Comments:: Denies hearing difficulties   Vision Screening - Comments:: Wears rx glasses - up to date with routine eye exams with  [facility] Immunizations and Health Maintenance Health Maintenance  Topic Date Due   OPHTHALMOLOGY EXAM  07/03/2024   COVID-19 Vaccine (8 - 2025-26 season) 08/17/2024   Influenza Vaccine  03/16/2025 (Originally 07/17/2024)   HEMOGLOBIN A1C  03/14/2025   Diabetic kidney evaluation - Urine ACR  05/04/2025   Diabetic kidney evaluation - eGFR measurement  09/14/2025   FOOT EXAM  09/14/2025   Medicare Annual Wellness (AWV)  11/17/2025   DTaP/Tdap/Td (4 - Td or Tdap) 09/19/2033   Colonoscopy  11/05/2034   Pneumococcal Vaccine: 50+ Years  Completed   Hepatitis C Screening  Completed   HIV Screening  Completed   Zoster Vaccines- Shingrix   Completed   Hepatitis B Vaccines 19-59 Average Risk  Aged Out   HPV VACCINES  Aged Out   Meningococcal B Vaccine  Aged Out        Assessment/Plan:  This is a routine wellness examination for [place].  Patient Care Team: Amon Aloysius BRAVO, MD as PCP - General Debarah Lorrene DEL., MD as Consulting Physician (Ophthalmology) Mavis Purchase, MD as Consulting Physician (Neurosurgery) Devere Lonni Righter, MD as Consulting  Physician (Urology)  I have personally reviewed and noted the following in the patient's chart:   Medical and social history Use of alcohol, tobacco or illicit drugs  Current medications and supplements including opioid prescriptions. Functional ability and status Nutritional status Physical activity Advanced directives List of other physicians Hospitalizations, surgeries, and ER visits in previous 12 months Vitals Screenings to include cognitive, depression, and falls Referrals and appointments  No orders of the defined types were placed in this encounter.  In addition, I have reviewed and discussed with patient certain preventive protocols, quality metrics, and best practice recommendations. A written personalized care plan for preventive services as well as general preventive health recommendations were provided to patient.   Rojelio LELON Blush, LPN   [id number]   Return in 1 year on 11/23/25  After Visit Summary: (In Person-Printed) AVS printed and given to the patient  Nurse Notes: None

## 2024-11-17 NOTE — Patient Instructions (Addendum)
 Daniel [name],  Thank you for taking the time for your Medicare Wellness Visit. I appreciate your continued commitment to your health goals. Please review the care plan we discussed, and feel free to reach out if I can assist you further.  Please note that Annual Wellness Visits do not include a physical exam. Some assessments may be limited, especially if the visit was conducted virtually. If needed, we may recommend an in-person follow-up with your provider.  Ongoing Care Seeing your primary care provider every 3 to 6 months helps us  monitor your health and provide consistent, personalized care.   Referrals If a referral was made during today's visit and you haven't received any updates within two weeks, please contact the referred provider directly to check on the status.  Recommended Screenings:  Health Maintenance  Topic Date Due   Eye exam for diabetics  07/03/2024   COVID-19 Vaccine (8 - 2025-26 season) 08/17/2024   Flu Shot  03/16/2025*   Hemoglobin A1C  03/14/2025   Yearly kidney health urinalysis for diabetes  05/04/2025   Yearly kidney function blood test for diabetes  09/14/2025   Complete foot exam   09/14/2025   Medicare Annual Wellness Visit  11/17/2025   DTaP/Tdap/Td vaccine (4 - Td or Tdap) 09/19/2033   Colon Cancer Screening  11/05/2034   Pneumococcal Vaccine for age over 50  Completed   Hepatitis C Screening  Completed   HIV Screening  Completed   Zoster (Shingles) Vaccine  Completed   Hepatitis B Vaccine  Aged Out   HPV Vaccine  Aged Out   Meningitis B Vaccine  Aged Out  *Topic was postponed. The date shown is not the original due date.   Opioid Pain Medicine Management Opioids are powerful medicines that are used to treat moderate to severe pain. When used for short periods of time, they can help you to: Sleep better. Do better in physical or occupational therapy. Feel better in the first few days after an injury. Recover from surgery. Opioids should be  taken with the supervision of a trained health care provider. They should be taken for the shortest period of time possible. This is because opioids can be addictive, and the longer you take opioids, the greater your risk of addiction. This addiction can also be called opioid use disorder. What are the risks? Using opioid pain medicines for longer than 3 days increases your risk of side effects. Side effects include: Constipation. Nausea and vomiting. Breathing difficulties (respiratory depression). Drowsiness. Confusion. Opioid use disorder. Itching. Taking opioid pain medicine for a long period of time can affect your ability to do daily tasks. It also puts you at risk for: Motor vehicle crashes. Depression. Suicide. Heart attack. Overdose, which can be life-threatening. What is a pain treatment plan? A pain treatment plan is an agreement between you and your health care provider. Pain is unique to each person, and treatments vary depending on your condition. To manage your pain, you and your health care provider need to work together. To help you do this: Discuss the goals of your treatment, including how much pain you might expect to have and how you will manage the pain. Review the risks and benefits of taking opioid medicines. Remember that a good treatment plan uses more than one approach and minimizes the chance of side effects. Be honest about the amount of medicines you take and about any drug or alcohol use. Get pain medicine prescriptions from only one health care provider. Pain can be  managed with many types of alternative treatments. Ask your health care provider to refer you to one or more specialists who can help you manage pain through: Physical or occupational therapy. Counseling (cognitive behavioral therapy). Good nutrition. Biofeedback. Massage. Meditation. Non-opioid medicine. Following a gentle exercise program. How to use opioid pain medicine Taking  medicine Take your pain medicine exactly as told by your health care provider. Take it only when you need it. If your pain gets less severe, you may take less than your prescribed dose if your health care provider approves. If you are not having pain, do nottake pain medicine unless your health care provider tells you to take it. If your pain is severe, do nottry to treat it yourself by taking more pills than instructed on your prescription. Contact your health care provider for help. Write down the times when you take your pain medicine. It is easy to become confused while on pain medicine. Writing the time can help you avoid overdose. Take other over-the-counter or prescription medicines only as told by your health care provider. Keeping yourself and others safe  While you are taking opioid pain medicine: Do not drive, use machinery, or power tools. Do not sign legal documents. Do not drink alcohol. Do not take sleeping pills. Do not supervise children by yourself. Do not do activities that require climbing or being in high places. Do not go to a lake, river, ocean, spa, or swimming pool. Do not share your pain medicine with anyone. Keep pain medicine in a locked cabinet or in a secure area where pets and children cannot reach it. Stopping your use of opioids If you have been taking opioid medicine for more than a few weeks, you may need to slowly decrease (taper) how much you take until you stop completely. Tapering your use of opioids can decrease your risk of symptoms of withdrawal, such as: Pain and cramping in the abdomen. Nausea. Sweating. Sleepiness. Restlessness. Uncontrollable shaking (tremors). Cravings for the medicine. Do not attempt to taper your use of opioids on your own. Talk with your health care provider about how to do this. Your health care provider may prescribe a step-down schedule based on how much medicine you are taking and how long you have been taking  it. Getting rid of leftover pills Do not save any leftover pills. Get rid of leftover pills safely by: Taking the medicine to a prescription take-back program. This is usually offered by the county or law enforcement. Bringing them to a pharmacy that has a drug disposal container. Flushing them down the toilet. Check the label or package insert of your medicine to see whether this is safe to do. Throwing them out in the trash. Check the label or package insert of your medicine to see whether this is safe to do. If it is safe to throw it out, remove the medicine from the original container, put it into a sealable bag or container, and mix it with used coffee grounds, food scraps, dirt, or cat litter before putting it in the trash. Follow these instructions at home: Activity Do exercises as told by your health care provider. Avoid activities that make your pain worse. Return to your normal activities as told by your health care provider. Ask your health care provider what activities are safe for you. General instructions You may need to take these actions to prevent or treat constipation: Drink enough fluid to keep your urine pale yellow. Take over-the-counter or prescription medicines. Eat foods that  are high in fiber, such as beans, whole grains, and fresh fruits and vegetables. Limit foods that are high in fat and processed sugars, such as fried or sweet foods. Keep all follow-up visits. This is important. Where to find support If you have been taking opioids for a long time, you may benefit from receiving support for quitting from a local support group or counselor. Ask your health care provider for a referral to these resources in your area. Where to find more information Centers for Disease Control and Prevention (CDC): footballexhibition.com.br [place] Food and Drug Administration (FDA): pumpkinsearch.com.ee Get help right away if: You may have taken too much of an opioid (overdosed). Common symptoms of an  overdose: Your breathing is slower or more shallow than normal. You have a very slow heartbeat (pulse). You have slurred speech. You have nausea and vomiting. Your pupils become very small. You have other potential symptoms: You are very confused. You faint or feel like you will faint. You have cold, clammy skin. You have blue lips or fingernails. You have thoughts of harming yourself or harming others. These symptoms may represent a serious problem that is an emergency. Do not wait to see if the symptoms will go away. Get medical help right away. Call your local emergency services (911 in the [place] Do not drive yourself to the hospital.  If you ever feel like you may hurt yourself or others, or have thoughts about taking your own life, get help right away. Go to your nearest emergency department or: Call your local emergency services (911 in the [place] Call the [facility] (575-195-4872 in the [place] Call a suicide crisis helpline, such as the National Suicide Prevention Lifeline at (727)327-3538 or 988 in the [place] This is open 24 hours a day in the [place] If you're a Veteran: Call 988 and press 1. This is open 24 hours a day. Text the [employer] at 539-783-8228. Summary Opioid medicines can help you manage moderate to severe pain for a short period of time. A pain treatment plan is an agreement between you and your health care provider. Discuss the goals of your treatment, including how much pain you might expect to have and how you will manage the pain. If you think that you or someone else may have taken too much of an opioid, get medical help right away. This information is not intended to replace advice given to you by your health care provider. Make sure you discuss any questions you have with your health care provider. Document Revised: 09/09/2023 Document Reviewed: 03/15/2021 Elsevier Patient Education  2024 Elsevier Inc.    11/17/2024    2:06 PM   Advanced Directives  Does Patient Have a Medical Advance Directive? Yes  Type of Estate Agent of [place];Living will  Does patient want to make changes to medical advance directive? No - Patient declined  Copy of Healthcare Power of Attorney in Chart? No - copy requested    Vision: Annual vision screenings are recommended for early detection of glaucoma, cataracts, and diabetic retinopathy. These exams can also reveal signs of chronic conditions such as diabetes and high blood pressure.  Dental: Annual dental screenings help detect early signs of oral cancer, gum disease, and other conditions linked to overall health, including heart disease and diabetes.  Please see the attached documents for additional preventive care recommendations.

## 2024-12-14 ENCOUNTER — Telehealth: Payer: Self-pay | Admitting: Family

## 2024-12-14 DIAGNOSIS — G4700 Insomnia, unspecified: Secondary | ICD-10-CM

## 2024-12-14 NOTE — Telephone Encounter (Signed)
 Requesting: Ambien  CR 12.5mg   Contract: under contract w/ pain medicine LID:lwizm contract w/ pain medicine Last Visit: 09/14/24 Next Visit:02/15/25 Last Refill: 10/20/24 #30 and 1RF   Please Advise

## 2024-12-15 NOTE — Telephone Encounter (Signed)
 PDMP okay, exiting

## 2024-12-28 LAB — HM DIABETES EYE EXAM

## 2025-01-05 ENCOUNTER — Other Ambulatory Visit: Payer: Self-pay | Admitting: Family

## 2025-01-07 ENCOUNTER — Encounter: Payer: Self-pay | Admitting: Internal Medicine

## 2025-01-12 ENCOUNTER — Other Ambulatory Visit: Payer: Self-pay

## 2025-01-12 ENCOUNTER — Telehealth: Payer: Self-pay

## 2025-01-12 MED ORDER — METFORMIN HCL 500 MG PO TABS: 500.0000 mg | ORAL_TABLET | Freq: Every day | ORAL | 1 refills | Status: AC

## 2025-01-12 NOTE — Telephone Encounter (Signed)
 Copied from CRM #[id number]. Topic: Clinical - Medication Refill >> Jan 12, 2025  8:40 AM Deleta RAMAN wrote: Medication: metFORMIN  (GLUCOPHAGE ) 500 MG tablet  Has the patient contacted their pharmacy? Yes pharmacy has tried contacting office for refill (Agent: If no, request that the patient contact the pharmacy for the refill. If patient does not wish to contact the pharmacy document the reason why and proceed with request.) (Agent: If yes, when and what did the pharmacy advise?)  This is the patient's preferred pharmacy:  [id number] - [place], [place] - [address] AT [facility] [address] [place] [address] Phone: [phone] Fax: [phone]  Is this the correct pharmacy for this prescription? Yes If no, delete pharmacy and type the correct one.   Has the prescription been filled recently? No  Is the patient out of the medication? Yes  Has the patient been seen for an appointment in the last year OR does the patient have an upcoming appointment? Yes  Can we respond through MyChart? Yes  Agent: Please be advised that Rx refills may take up to 3 business days. We ask that you follow-up with your pharmacy.

## 2025-02-15 ENCOUNTER — Ambulatory Visit: Admitting: Internal Medicine

## 2025-11-23 ENCOUNTER — Ambulatory Visit
